# Patient Record
Sex: Male | Born: 1950 | Race: White | Hispanic: No | Marital: Married | Smoking: Former smoker
Health system: Southern US, Community
[De-identification: ages and names within clinical notes are randomized; demographics above are authoritative.]

## PROBLEM LIST (undated history)

## (undated) DIAGNOSIS — Z87442 Personal history of urinary calculi: Secondary | ICD-10-CM

## (undated) DIAGNOSIS — H9319 Tinnitus, unspecified ear: Secondary | ICD-10-CM

## (undated) DIAGNOSIS — Z9119 Patient's noncompliance with other medical treatment and regimen: Secondary | ICD-10-CM

## (undated) DIAGNOSIS — I739 Peripheral vascular disease, unspecified: Secondary | ICD-10-CM

## (undated) DIAGNOSIS — F528 Other sexual dysfunction not due to a substance or known physiological condition: Secondary | ICD-10-CM

## (undated) DIAGNOSIS — I2510 Atherosclerotic heart disease of native coronary artery without angina pectoris: Secondary | ICD-10-CM

## (undated) DIAGNOSIS — J309 Allergic rhinitis, unspecified: Secondary | ICD-10-CM

## (undated) DIAGNOSIS — I10 Essential (primary) hypertension: Secondary | ICD-10-CM

## (undated) DIAGNOSIS — L219 Seborrheic dermatitis, unspecified: Secondary | ICD-10-CM

## (undated) DIAGNOSIS — N320 Bladder-neck obstruction: Secondary | ICD-10-CM

## (undated) DIAGNOSIS — G4730 Sleep apnea, unspecified: Secondary | ICD-10-CM

## (undated) DIAGNOSIS — E119 Type 2 diabetes mellitus without complications: Secondary | ICD-10-CM

## (undated) DIAGNOSIS — R0600 Dyspnea, unspecified: Secondary | ICD-10-CM

## (undated) DIAGNOSIS — Z8719 Personal history of other diseases of the digestive system: Secondary | ICD-10-CM

## (undated) DIAGNOSIS — E785 Hyperlipidemia, unspecified: Secondary | ICD-10-CM

## (undated) HISTORY — DX: Allergic rhinitis, unspecified: J30.9

## (undated) HISTORY — DX: Type 2 diabetes mellitus without complications: E11.9

## (undated) HISTORY — DX: Seborrheic dermatitis, unspecified: L21.9

## (undated) HISTORY — DX: Bladder-neck obstruction: N32.0

## (undated) HISTORY — DX: Tinnitus, unspecified ear: H93.19

## (undated) HISTORY — DX: Patient's noncompliance with other medical treatment and regimen: Z91.19

## (undated) HISTORY — DX: Hyperlipidemia, unspecified: E78.5

## (undated) HISTORY — DX: Peripheral vascular disease, unspecified: I73.9

## (undated) HISTORY — DX: Other sexual dysfunction not due to a substance or known physiological condition: F52.8

## (undated) HISTORY — DX: Essential (primary) hypertension: I10

---

## 1974-12-25 HISTORY — PX: VASECTOMY: SHX75

## 2005-01-24 ENCOUNTER — Ambulatory Visit: Payer: Self-pay | Admitting: Endocrinology

## 2005-02-01 ENCOUNTER — Ambulatory Visit: Payer: Self-pay | Admitting: Endocrinology

## 2005-02-17 ENCOUNTER — Ambulatory Visit: Payer: Self-pay

## 2005-04-28 ENCOUNTER — Ambulatory Visit: Payer: Self-pay | Admitting: Endocrinology

## 2005-05-05 ENCOUNTER — Ambulatory Visit: Payer: Self-pay | Admitting: Endocrinology

## 2005-08-04 ENCOUNTER — Ambulatory Visit: Payer: Self-pay | Admitting: Endocrinology

## 2005-08-11 ENCOUNTER — Ambulatory Visit: Payer: Self-pay | Admitting: Endocrinology

## 2005-08-11 ENCOUNTER — Ambulatory Visit: Payer: Self-pay

## 2005-10-23 ENCOUNTER — Ambulatory Visit: Payer: Self-pay | Admitting: Endocrinology

## 2005-10-25 ENCOUNTER — Ambulatory Visit: Payer: Self-pay | Admitting: Endocrinology

## 2005-11-24 ENCOUNTER — Ambulatory Visit: Payer: Self-pay | Admitting: Endocrinology

## 2006-01-05 ENCOUNTER — Ambulatory Visit: Payer: Self-pay | Admitting: Endocrinology

## 2006-01-19 ENCOUNTER — Ambulatory Visit: Payer: Self-pay

## 2006-02-14 ENCOUNTER — Ambulatory Visit: Payer: Self-pay | Admitting: Endocrinology

## 2006-02-14 ENCOUNTER — Ambulatory Visit: Payer: Self-pay | Admitting: Cardiology

## 2006-03-09 ENCOUNTER — Ambulatory Visit: Payer: Self-pay | Admitting: Endocrinology

## 2006-05-17 ENCOUNTER — Ambulatory Visit: Payer: Self-pay | Admitting: Cardiology

## 2006-06-11 ENCOUNTER — Ambulatory Visit: Payer: Self-pay | Admitting: Endocrinology

## 2006-06-15 ENCOUNTER — Ambulatory Visit: Payer: Self-pay | Admitting: Endocrinology

## 2007-08-10 ENCOUNTER — Encounter: Payer: Self-pay | Admitting: Endocrinology

## 2007-08-10 DIAGNOSIS — I10 Essential (primary) hypertension: Secondary | ICD-10-CM

## 2007-08-10 DIAGNOSIS — E785 Hyperlipidemia, unspecified: Secondary | ICD-10-CM | POA: Insufficient documentation

## 2007-08-10 DIAGNOSIS — J309 Allergic rhinitis, unspecified: Secondary | ICD-10-CM | POA: Insufficient documentation

## 2007-08-10 DIAGNOSIS — E119 Type 2 diabetes mellitus without complications: Secondary | ICD-10-CM

## 2007-08-10 HISTORY — DX: Essential (primary) hypertension: I10

## 2007-08-10 HISTORY — DX: Type 2 diabetes mellitus without complications: E11.9

## 2007-08-10 HISTORY — DX: Allergic rhinitis, unspecified: J30.9

## 2007-08-10 HISTORY — DX: Hyperlipidemia, unspecified: E78.5

## 2007-09-02 ENCOUNTER — Ambulatory Visit: Payer: Self-pay | Admitting: Endocrinology

## 2007-09-02 LAB — CONVERTED CEMR LAB
ALT: 37 units/L (ref 0–53)
Basophils Relative: 0.8 % (ref 0.0–1.0)
Bilirubin Urine: NEGATIVE
Bilirubin, Direct: 0.2 mg/dL (ref 0.0–0.3)
CO2: 31 meq/L (ref 19–32)
Creatinine, Ser: 1.1 mg/dL (ref 0.4–1.5)
Eosinophils Relative: 6.1 % — ABNORMAL HIGH (ref 0.0–5.0)
GFR calc Af Amer: 89 mL/min
Glucose, Bld: 163 mg/dL — ABNORMAL HIGH (ref 70–99)
HCT: 41.5 % (ref 39.0–52.0)
Hemoglobin: 14.5 g/dL (ref 13.0–17.0)
Leukocytes, UA: NEGATIVE
Lymphocytes Relative: 31.9 % (ref 12.0–46.0)
Monocytes Absolute: 0.7 10*3/uL (ref 0.2–0.7)
Neutro Abs: 4.9 10*3/uL (ref 1.4–7.7)
Nitrite: NEGATIVE
Potassium: 4.4 meq/L (ref 3.5–5.1)
Specific Gravity, Urine: 1.02 (ref 1.000–1.03)
TSH: 1.43 microintl units/mL (ref 0.35–5.50)
Total Bilirubin: 1 mg/dL (ref 0.3–1.2)
Total Protein, Urine: NEGATIVE mg/dL
Total Protein: 7.3 g/dL (ref 6.0–8.3)
Triglycerides: 43 mg/dL (ref 0–149)
Urobilinogen, UA: 0.2 (ref 0.0–1.0)
WBC: 9.2 10*3/uL (ref 4.5–10.5)

## 2007-09-06 ENCOUNTER — Ambulatory Visit: Payer: Self-pay | Admitting: Endocrinology

## 2007-09-06 LAB — CONVERTED CEMR LAB: Hgb A1c MFr Bld: 7.1 % — ABNORMAL HIGH (ref 4.6–6.0)

## 2008-01-29 ENCOUNTER — Encounter: Payer: Self-pay | Admitting: Internal Medicine

## 2008-11-09 ENCOUNTER — Telehealth: Payer: Self-pay | Admitting: Internal Medicine

## 2008-11-13 ENCOUNTER — Ambulatory Visit: Payer: Self-pay | Admitting: Endocrinology

## 2008-11-13 DIAGNOSIS — L219 Seborrheic dermatitis, unspecified: Secondary | ICD-10-CM

## 2008-11-13 DIAGNOSIS — I739 Peripheral vascular disease, unspecified: Secondary | ICD-10-CM

## 2008-11-13 DIAGNOSIS — F1010 Alcohol abuse, uncomplicated: Secondary | ICD-10-CM | POA: Insufficient documentation

## 2008-11-13 DIAGNOSIS — F528 Other sexual dysfunction not due to a substance or known physiological condition: Secondary | ICD-10-CM

## 2008-11-13 DIAGNOSIS — N320 Bladder-neck obstruction: Secondary | ICD-10-CM

## 2008-11-13 DIAGNOSIS — Z9119 Patient's noncompliance with other medical treatment and regimen: Secondary | ICD-10-CM

## 2008-11-13 DIAGNOSIS — Z91199 Patient's noncompliance with other medical treatment and regimen due to unspecified reason: Secondary | ICD-10-CM

## 2008-11-13 HISTORY — DX: Bladder-neck obstruction: N32.0

## 2008-11-13 HISTORY — DX: Patient's noncompliance with other medical treatment and regimen due to unspecified reason: Z91.199

## 2008-11-13 HISTORY — DX: Patient's noncompliance with other medical treatment and regimen: Z91.19

## 2008-11-13 HISTORY — DX: Other sexual dysfunction not due to a substance or known physiological condition: F52.8

## 2008-11-13 HISTORY — DX: Seborrheic dermatitis, unspecified: L21.9

## 2008-11-13 HISTORY — DX: Peripheral vascular disease, unspecified: I73.9

## 2009-01-15 ENCOUNTER — Ambulatory Visit: Payer: Self-pay | Admitting: Cardiovascular Disease

## 2009-01-15 ENCOUNTER — Ambulatory Visit: Payer: Self-pay

## 2009-02-22 ENCOUNTER — Encounter: Payer: Self-pay | Admitting: Endocrinology

## 2009-06-16 ENCOUNTER — Telehealth: Payer: Self-pay | Admitting: Endocrinology

## 2009-06-29 ENCOUNTER — Telehealth: Payer: Self-pay | Admitting: Endocrinology

## 2009-06-30 ENCOUNTER — Ambulatory Visit: Payer: Self-pay | Admitting: Endocrinology

## 2009-06-30 LAB — CONVERTED CEMR LAB
ALT: 52 units/L (ref 0–53)
AST: 38 units/L — ABNORMAL HIGH (ref 0–37)
Alkaline Phosphatase: 64 units/L (ref 39–117)
BUN: 32 mg/dL — ABNORMAL HIGH (ref 6–23)
Basophils Relative: 0.5 % (ref 0.0–3.0)
Bilirubin Urine: NEGATIVE
Bilirubin, Direct: 0.2 mg/dL (ref 0.0–0.3)
Chloride: 103 meq/L (ref 96–112)
Creatinine, Ser: 1.5 mg/dL (ref 0.4–1.5)
Eosinophils Relative: 5.8 % — ABNORMAL HIGH (ref 0.0–5.0)
GFR calc non Af Amer: 50.9 mL/min (ref >60)
Hgb A1c MFr Bld: 7.3 % — ABNORMAL HIGH (ref 4.6–6.5)
LDL Cholesterol: 53 mg/dL (ref 0–99)
Leukocytes, UA: NEGATIVE
Lymphocytes Relative: 32.4 % (ref 12.0–46.0)
MCV: 89.6 fL (ref 78.0–100.0)
Microalb Creat Ratio: 8.3 mg/g (ref 0.0–30.0)
Monocytes Absolute: 0.8 10*3/uL (ref 0.1–1.0)
Monocytes Relative: 8.3 % (ref 3.0–12.0)
Neutrophils Relative %: 53 % (ref 43.0–77.0)
Nitrite: NEGATIVE
PSA: 1.9 ng/mL (ref 0.10–4.00)
Platelets: 230 10*3/uL (ref 150.0–400.0)
RBC: 5.19 M/uL (ref 4.22–5.81)
Total Bilirubin: 1.3 mg/dL — ABNORMAL HIGH (ref 0.3–1.2)
Total CHOL/HDL Ratio: 2
Total Protein, Urine: NEGATIVE mg/dL
Total Protein: 7.7 g/dL (ref 6.0–8.3)
Triglycerides: 38 mg/dL (ref 0.0–149.0)
VLDL: 7.6 mg/dL (ref 0.0–40.0)
WBC: 9.5 10*3/uL (ref 4.5–10.5)
pH: 5.5 (ref 5.0–8.0)

## 2009-07-02 ENCOUNTER — Ambulatory Visit: Payer: Self-pay | Admitting: Endocrinology

## 2009-07-23 ENCOUNTER — Telehealth: Payer: Self-pay | Admitting: Endocrinology

## 2009-11-02 ENCOUNTER — Ambulatory Visit: Payer: Self-pay | Admitting: Endocrinology

## 2009-11-02 ENCOUNTER — Telehealth (INDEPENDENT_AMBULATORY_CARE_PROVIDER_SITE_OTHER): Payer: Self-pay | Admitting: *Deleted

## 2009-11-02 LAB — CONVERTED CEMR LAB
ALT: 39 units/L (ref 0–53)
Bilirubin, Direct: 0.2 mg/dL (ref 0.0–0.3)
Cholesterol: 104 mg/dL (ref 0–200)
HDL: 34 mg/dL — ABNORMAL LOW (ref >39.00)
LDL Cholesterol: 63 mg/dL (ref 0–99)
Total Bilirubin: 1 mg/dL (ref 0.3–1.2)
Total Protein: 7.7 g/dL (ref 6.0–8.3)
Triglycerides: 33 mg/dL (ref 0.0–149.0)

## 2009-11-05 ENCOUNTER — Ambulatory Visit: Payer: Self-pay | Admitting: Endocrinology

## 2009-11-05 DIAGNOSIS — H9319 Tinnitus, unspecified ear: Secondary | ICD-10-CM | POA: Insufficient documentation

## 2009-11-05 HISTORY — DX: Tinnitus, unspecified ear: H93.19

## 2009-12-07 ENCOUNTER — Telehealth: Payer: Self-pay | Admitting: Endocrinology

## 2010-09-09 ENCOUNTER — Encounter: Payer: Self-pay | Admitting: Endocrinology

## 2010-10-14 ENCOUNTER — Ambulatory Visit: Payer: Self-pay | Admitting: Endocrinology

## 2010-10-14 LAB — CONVERTED CEMR LAB
Basophils Absolute: 0 10*3/uL (ref 0.0–0.1)
Bilirubin, Direct: 0.2 mg/dL (ref 0.0–0.3)
Calcium: 9.8 mg/dL (ref 8.4–10.5)
Eosinophils Absolute: 0.5 10*3/uL (ref 0.0–0.7)
GFR calc non Af Amer: 90.22 mL/min (ref >60)
HDL: 38.1 mg/dL — ABNORMAL LOW (ref >39.00)
Leukocytes, UA: NEGATIVE
MCHC: 34 g/dL (ref 30.0–36.0)
MCV: 87.9 fL (ref 78.0–100.0)
Monocytes Absolute: 0.8 10*3/uL (ref 0.1–1.0)
Neutrophils Relative %: 52.6 % (ref 43.0–77.0)
Nitrite: NEGATIVE
PSA: 1.32 ng/mL (ref 0.10–4.00)
Platelets: 233 10*3/uL (ref 150.0–400.0)
Potassium: 4.9 meq/L (ref 3.5–5.1)
RDW: 14.2 % (ref 11.5–14.6)
Sodium: 139 meq/L (ref 135–145)
TSH: 1.42 microintl units/mL (ref 0.35–5.50)
Total Bilirubin: 0.9 mg/dL (ref 0.3–1.2)
Total CHOL/HDL Ratio: 4
Total Protein, Urine: NEGATIVE mg/dL
VLDL: 7 mg/dL (ref 0.0–40.0)
WBC: 8.7 10*3/uL (ref 4.5–10.5)
pH: 6 (ref 5.0–8.0)

## 2010-10-21 ENCOUNTER — Ambulatory Visit: Payer: Self-pay | Admitting: Endocrinology

## 2010-10-21 ENCOUNTER — Encounter: Payer: Self-pay | Admitting: Endocrinology

## 2010-10-21 DIAGNOSIS — R9431 Abnormal electrocardiogram [ECG] [EKG]: Secondary | ICD-10-CM

## 2010-10-21 LAB — CONVERTED CEMR LAB
Creatinine,U: 194.7 mg/dL
Microalb, Ur: 1.5 mg/dL (ref 0.0–1.9)

## 2010-10-24 ENCOUNTER — Encounter (HOSPITAL_COMMUNITY)
Admission: RE | Admit: 2010-10-24 | Discharge: 2010-12-24 | Payer: Self-pay | Source: Home / Self Care | Attending: Endocrinology | Admitting: Endocrinology

## 2010-10-24 ENCOUNTER — Encounter: Payer: Self-pay | Admitting: Internal Medicine

## 2010-10-24 ENCOUNTER — Ambulatory Visit: Payer: Self-pay | Admitting: Internal Medicine

## 2010-10-24 ENCOUNTER — Ambulatory Visit: Payer: Self-pay

## 2010-10-25 ENCOUNTER — Telehealth: Payer: Self-pay | Admitting: Endocrinology

## 2011-01-26 ENCOUNTER — Telehealth: Payer: Self-pay | Admitting: Endocrinology

## 2011-01-26 NOTE — Assessment & Plan Note (Signed)
Summary: CPX-LB   Vital Signs:  Patient profile:   60 year old male Height:      67 inches (170.18 cm) Weight:      214.25 pounds (97.39 kg) BMI:     33.68 O2 Sat:      95 % on Room air Temp:     98.4 degrees F (36.89 degrees C) oral Pulse rate:   93 / minute BP sitting:   120 / 78  (left arm) Cuff size:   large  Vitals Entered By: Brenton Grills CMA Duncan Dull) (October 21, 2010 2:14 PM)  O2 Flow:  Room air CC: Physical/pt is not currently taking Questran or Relion Insulin/aj Is Patient Diabetic? Yes   CC:  Physical/pt is not currently taking Questran or Relion Insulin/aj.  History of Present Illness: here for regular wellness examination.  He's feeling pretty well in general, and does not drink or smoke.  Current Medications (verified): 1)  Toprol Xl 25 Mg  Tb24 (Metoprolol Succinate) .... Take 1 By Mouth Qd 2)  Novolog Flexpen 100 Unit/ml  Soln (Insulin Aspart) .... 30 Units 5 Times A Day 3)  Adult Aspirin Low Strength 81 Mg  Tbdp (Aspirin) .... Take 1 By Mouth Qd 4)  Questran 4 Gm  Pack (Cholestyramine) .... Take 1 Once Daily Pack 5)  Viagra 50 Mg  Tabs (Sildenafil Citrate) .... Use Prn 6)  Allegra-D 24 Hour 180-240 Mg  Tb24 (Fexofenadine-Pseudoephedrine) .... Use Prn 7)  Relion Blood Glucose Test   Strp (Glucose Blood) .... Use As Directed 8)  Hyzaar 100-25 Mg Tabs (Losartan Potassium-Hctz) .... 1/2 Qd 9)  Simvastatin 80 Mg Tabs (Simvastatin) .Marland Kitchen.. 1 Qhs 10)  Flomax 0.4 Mg Xr24h-Cap (Tamsulosin Hcl) .Marland Kitchen.. 1 Qd 11)  Relion N 100 Unit/ml Susp (Insulin Isophane Human) .... 5 Units Qhs  Allergies (verified): 1)  ! Sulfa 2)  ! Glucophage 3)  ! * Crestor  Family History: Reviewed history from 11/05/2009 and no changes required. no cancer in immediate family father died heart disease at age 46  Social History: Reviewed history from 07/02/2009 and no changes required. married. works for [employer].  Review of Systems  The patient denies fever, weight loss,  weight gain, vision loss, decreased hearing, syncope, dyspnea on exertion, prolonged cough, headaches, abdominal pain, melena, hematochezia, severe indigestion/heartburn, hematuria, suspicious skin lesions, and depression.    Physical Exam  Rectal:  normal external and internal exam.  heme neg  Prostate:  Normal size prostate without masses or tenderness.  Additional Exam:  SEPARATE EVALUATION FOLLOWS--EACH PROBLEM HERE IS NEW, NOT RESPONDING TO TREATMENT, OR POSES SIGNIFICANT RISK TO THE PATIENT'S HEALTH: HISTORY OF THE PRESENT ILLNESS: abnormal ecg is noted today.  he has few years of slight pain in the legs, in the context of walking, worse on the right leg, than the left.  no assoc chest pain. PAST MEDICAL HISTORY reviewed and up to date today. SOCIAL HX:  10/31 is the last day of his health insurance. REVIEW OF SYSTEMS: denies hypoglycemia.  he has lost weight, due to his efforts PHYSICAL EXAMINATION: clear to auscultation.  no respiratory distress dorsalis pedis are absent bilat.  no carotid bruit cv: reg rate and rhythm.  no murmur LAB/XRAY RESULTS: ecg is reviewed IMPRESSION: abnormal ecg (new) in a pt at high-risk for coronary artery disease.  i discussed the case with dr cooper. PLAN: see instruction sheet   Impression & Recommendations:  Problem # 1:  ROUTINE GENERAL MEDICAL EXAM@HEALTH  CARE FACL (ICD-V70.0)  Other Orders: EKG w/ Interpretation (93000) Cardiolite (Cardiolite) TLB-A1C / Hgb A1C (Glycohemoglobin) (83036-A1C) TLB-Microalbumin/Creat Ratio, Urine (82043-MALB) Est. Patient Level IV (35009) Est. Patient 40-64 years (38182)   Patient Instructions: 1)  you should resume cholestyramine.  here are some samples of "welchol" (similar) 2)  here are some samples of diovan-hct 320/25 (similar to hyzaar). 3)  Please schedule a follow-up appointment in 6 months. 4)  blood tests are being ordered for you today.  please call [phone] to hear your test results. 5)   good diet and exercise habits significanly improve the control of your diabetes.  please let me know if you wish to be referred to a dietician.  high blood sugar is very risky to your health.  you should see an eye doctor every year. 6)  controlling your blood pressure and cholesterol drastically reduces the damage diabetes does to your body.  this also applies to quitting smoking.  please discuss these with your doctor.  you should take an aspirin every day, unless you have been advised by a doctor not to. 7)  please consider these measures for your health:  minimize alcohol.  do not use tobacco products.  have a colonoscopy at least every 10 years from age 37.  flu shots and pneumonis vaccine help to prevent illnesses.  keep firearms safely stored.  always use seat belts.  have working smoke alarms in your home.  see an eye doctor and dentist regularly.  never drive under the influence of alcohol or drugs (including prescription drugs).  those with fair skin should take precautions against the sun. Prescriptions: FLOMAX 0.4 MG XR24H-CAP (TAMSULOSIN HCL) 1 qd  #90 x 3   Entered and Authorized by:   Minus Breeding MD   Signed by:   Minus Breeding MD on 10/21/2010   Method used:   Print then Give to Patient   RxID:   [id number] SIMVASTATIN 80 MG TABS (SIMVASTATIN) 1 qhs  #90 x 3   Entered and Authorized by:   Minus Breeding MD   Signed by:   Minus Breeding MD on 10/21/2010   Method used:   Print then Give to Patient   RxID:   [id number] RELION BLOOD GLUCOSE TEST   STRP (GLUCOSE BLOOD) use as directed  #100 Each x 0   Entered and Authorized by:   Minus Breeding MD   Signed by:   Minus Breeding MD on 10/21/2010   Method used:   Print then Give to Patient   RxID:   [id number] QUESTRAN 4 GM  PACK (CHOLESTYRAMINE) take 1 once daily pack  #90 x 3   Entered and Authorized by:   Minus Breeding MD   Signed by:   Minus Breeding MD on 10/21/2010   Method used:   Print then Give to  Patient   RxID:   [id number] NOVOLOG FLEXPEN 100 UNIT/ML  SOLN (INSULIN ASPART) 30 units 5 times a day  #3 boxes x 11   Entered and Authorized by:   Minus Breeding MD   Signed by:   Minus Breeding MD on 10/21/2010   Method used:   Print then Give to Patient   RxID:   [id number] TOPROL XL 25 MG  TB24 (METOPROLOL SUCCINATE) take 1 by mouth qd  #90 x 3   Entered and Authorized by:   Minus Breeding MD   Signed by:   Minus Breeding MD on 10/21/2010   Method used:  Print then Give to Patient   RxID:   [id number]    Orders Added: 1)  EKG w/ Interpretation [93000] 2)  Cardiolite [Cardiolite] 3)  TLB-A1C / Hgb A1C (Glycohemoglobin) [83036-A1C] 4)  TLB-Microalbumin/Creat Ratio, Urine [82043-MALB] 5)  Est. Patient Level IV [56213] 6)  Est. Patient 40-64 years [08657]

## 2011-01-26 NOTE — Letter (Signed)
Summary: Dr Lita Mains. Sherryle Lis  Dr Lita Mains. [place]   Imported By: Sherian Rein 09/15/2010 11:03:56  _____________________________________________________________________  External Attachment:    Type:   Image     Comment:   External Document

## 2011-01-26 NOTE — Assessment & Plan Note (Signed)
Summary: Cardiology Nuclear Testing  Nuclear Med Background Indications for Stress Test: Evaluation for Ischemia, Abnormal EKG  Indications Comments: Abnormal EKG   History: Myocardial Perfusion Study  History Comments: '06 MPS normal  Symptoms: SOB    Nuclear Pre-Procedure Cardiac Risk Factors: Claudication, Family History - CAD, History of Smoking, Hypertension, IDDM Type 2, Lipids, PVD Caffeine/Decaff Intake: none NPO After: 11:00 PM Lungs: clear IV 0.9% NS with Angio Cath: 22g     IV Site: R Hand IV Started by: Cathlyn Parsons, RN Chest Size (in) 48     Height (in): 67 Weight (lb): 213 BMI: 33.48 Tech Comments: Toprol held for 10hrs.   Nuclear Med Study 1 or 2 day study:  1 day     Stress Test Type:  Treadmill/Lexiscan Reading MD:  Dietrich Pates, MD     Referring MD:  S.Ellison Resting Radionuclide:  Technetium 29m Tetrofosmin     Resting Radionuclide Dose:  11 mCi  Stress Radionuclide:  Technetium 47m Tetrofosmin     Stress Radionuclide Dose:  33 mCi   Stress Protocol  Max Systolic BP: 106 mm Hg Lexiscan: 0.4 mg   Stress Test Technologist:  Milana Na, EMT-P     Nuclear Technologist:  Doyne Keel, CNMT  Rest Procedure  Myocardial perfusion imaging was performed at rest 45 minutes following the intravenous administration of Technetium 73m Tetrofosmin.  Stress Procedure  The patient received IV Lexiscan 0.4 mg over 15-seconds with concurrent low level exercise and then Technetium 46m Tetrofosmin was injected at 30-seconds while the patient continued walking one more minute.  There were non specific changes with Lexiscan.  Quantitative spect images were obtained after a 45 minute delay.  QPS Raw Data Images:  Images were motion corrected.  Soft tissue (diaphragm, bowel activity) underlies heart. Stress Images:  Minimal apical thinning  Otherwise normal perfusion.  Rest Images:  Improvement in the apical region. Subtraction (SDS):  Change is not signif for  ischemia. Transient Ischemic Dilatation:  1.02  (Normal <1.22)  Lung/Heart Ratio:  .33  (Normal <0.45)  Quantitative Gated Spect Images QGS EDV:  98 ml QGS ESV:  38 ml QGS EF:  61 %   Overall Impression  Exercise Capacity: Lexiscan with low level exercise BP Response: Normal blood pressure response. Clinical Symptoms: No chest pain ECG Impression: No significant ST segment change suggestive of ischemia. Overall Impression Comments: Stress images with minimal apical thinning otherwise normal perfusion.  Apex is normal in the rest images.  Overall, though  change in this small region does not appear to reflect significant ischemia.  Overall low risk scan.

## 2011-01-26 NOTE — Progress Notes (Signed)
  Phone Note Call from Patient Call back at Home Phone [phone]   Caller: Patient Summary of Call: Pt called requesting Rx for Insulin vial instead of pen due to cost. Pt is also requesting MD advise on instructions for Diovan sample given at last OV, should pt take 1/2 tab as he was with Hyzaar or a whole tablet? Initial call taken by: Margaret Pyle, CMA,  October 25, 2010 10:25 AM  Follow-up for Phone Call        good news:  you heart test was normal the diovan-hct is interchangeable with hyzaar, so take 1/2 tab once daily. i am changed novolog to vial.  do you want a printed rx, or electronically sent?  you should be good with the samples i gave you for now, aren't you? Follow-up by: Minus Breeding MD,  October 25, 2010 10:46 AM  Additional Follow-up for Phone Call Additional follow up Details #1::        Pt does have enough samples, he is requesting printed Rx  Additional Follow-up by: Margaret Pyle, CMA,  October 25, 2010 11:18 AM    Additional Follow-up for Phone Call Additional follow up Details #2::    i printed Follow-up by: Minus Breeding MD,  October 25, 2010 11:20 AM  Additional Follow-up for Phone Call Additional follow up Details #3:: Details for Additional Follow-up Action Taken: Pt informed, Rx in cabinet for pt pick up Additional Follow-up by: Margaret Pyle, CMA,  October 25, 2010 11:27 AM  New/Updated Medications: NOVOLOG 100 UNIT/ML SOLN (INSULIN ASPART) 30 units 5x a day Prescriptions: NOVOLOG 100 UNIT/ML SOLN (INSULIN ASPART) 30 units 5x a day  #5 vials x 11   Entered and Authorized by:   Minus Breeding MD   Signed by:   Minus Breeding MD on 10/25/2010   Method used:   Print then Give to Patient   RxID:   [id number]

## 2011-02-01 NOTE — Progress Notes (Signed)
Summary: Rx increase  Phone Note Call from Patient Call back at Home Phone [phone]   Caller: Patient Summary of Call: Pt called requesting increase in Rx for Flomax, pt states he discussed this with MD at last OV and has noticed that medication has become less effective over time. Initial call taken by: Margaret Pyle, CMA,  January 26, 2011 9:10 AM  Follow-up for Phone Call        rx sent i am happy to ref you to urol if you wish Follow-up by: Minus Breeding MD,  January 26, 2011 9:20 AM    New/Updated Medications: TAMSULOSIN HCL 0.4 MG CAPS (TAMSULOSIN HCL) 1 tab two times a day Prescriptions: TAMSULOSIN HCL 0.4 MG CAPS (TAMSULOSIN HCL) 1 tab two times a day  #60 x 11   Entered and Authorized by:   Minus Breeding MD   Signed by:   Minus Breeding MD on 01/26/2011   Method used:   Electronically to        [facility] (retail)       [address]       [place], [place]  [address]       Ph: [phone]       Fax: [phone]   RxID:   [id number]

## 2011-02-01 NOTE — Progress Notes (Signed)
Summary: tamsulosin rx?  Phone Note From Pharmacy   Caller: [facility] Summary of Call: [facility] sent fax asking to verify dosage of Tamsulosin. They want to verify if it should be 1 two times a day vs. 2 once daily  Initial call taken by: Brenton Grills CMA Duncan Dull),  January 26, 2011 10:07 AM  Follow-up for Phone Call        either is ok Follow-up by: Minus Breeding MD,  January 26, 2011 10:29 AM  Additional Follow-up for Phone Call Additional follow up Details #1::        pharmacy advised 0.8 mg 1 by mouth once daily 30x11 Additional Follow-up by: Margaret Pyle, CMA,  January 26, 2011 10:45 AM

## 2011-05-09 NOTE — Progress Notes (Signed)
[facility]                        PERIPHERAL VASCULAR OFFICE NOTE   NAME:OCONNORKashis, Patterson                      MRN:          [id number]  DATE:01/15/2009                            DOB:          Apr 24, 1951    REASON FOR VISIT:  Lower extremity PAD.   HISTORY OF PRESENT ILLNESS:  Mr. Antonio Patterson is a very nice 60 year old  gentleman with occlusion of his right popliteal artery and intermittent  right calf claudication.  He was initially diagnosed by ultrasound that  demonstrated widely patent proximal vessels of the leg with complete  occlusion of the right popliteal artery.  His ABI back in 2007 was 0.77  on the right and 1.2 on the left.  He admits to intermittent  claudication after approximately one-quarter mile of walking.  He is  able to walk up to a mile without stopping, but has some has right calf  cramping and aching, more pronounced if he walks at a faster pace.  His  right leg does not feel cool.  He has no thigh pain or buttock pain.  He  denies ulcerations or rest pain.  He denies chest pain, dyspnea, edema,  or other cardiac complaints.   MEDICATIONS:  1. NovoLog as directed.  2. Aspirin 81 mg daily.  3. Vytorin 10/80 mg daily.  4. Benicar HCT 20/12.5 mg daily.  5. Metoprolol 12.5 mg b.i.d.  6. Flomax 0.4 mg daily.  7. Multivitamin 1 daily.  8. Claritin-D daily.   ALLERGIES:  SULFA, GLUCOPHAGE, CRESTOR.   PHYSICAL EXAMINATION:  GENERAL:  The patient is alert and oriented.  He  is in no acute distress.  VITAL SIGNS:  Weight is 216 pounds, blood pressure 116/70 in the right  arm and 110/70 in the left arm, heart rate 68, respiratory rate is 12.  HEENT:  Normal.  NECK:  Normal carotid upstrokes.  No bruits.  JVP normal.  LUNGS:  Clear bilaterally.  HEART:  Regular rate and rhythm.  No murmurs or gallops.  ABDOMEN:  Soft, nontender.  No organomegaly.  No abdominal bruits.  BACK:  No CVA tenderness.  EXTREMITIES:  Femoral pulses are  2+, popliteal pulses are 2+, dorsalis  pedis and posterior tibial pulses are 2+ on the left and not palpable on  the right.  SKIN:  Warm and dry without rash.  There are no ischemic ulcerations.  NEUROLOGIC:  Cranial nerves II through XII are intact.  Strength is  intact and equal.   ASSESSMENT:  1. 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      Recommend continued medical therapy.  The patient is on an      excellent medical regimen with aggressive treatment of his      hypertension, dyslipidemia, and diabetes.  He is on antiplatelet      therapy with aspirin.  His symptoms are stable and only minimally      lifestyle limiting.  We will check ABIs as it has been 3 years      since his last study  to have objective data on his lower extremity      arterial disease.  I would like to see him back in 1 year for      followup.  2. Hypertension.  Blood pressure under optimal control on Benicar HCT      and metoprolol.  3. Dyslipidemia.  He remains on Vytorin 10/80 and is followed by Dr.      Everardo All.   For followup, I will see Mr. Antonio Patterson in 1 year.     Veverly Fells. Excell Seltzer, MD  Electronically Signed    MDC/MedQ  DD: 01/15/2009  DT: 01/16/2009  Job #: [id number]

## 2011-05-31 ENCOUNTER — Encounter: Payer: Self-pay | Admitting: Endocrinology

## 2011-05-31 ENCOUNTER — Ambulatory Visit: Payer: Self-pay | Admitting: Endocrinology

## 2011-05-31 ENCOUNTER — Ambulatory Visit (INDEPENDENT_AMBULATORY_CARE_PROVIDER_SITE_OTHER): Payer: Self-pay | Admitting: Endocrinology

## 2011-05-31 VITALS — BP 122/68 | HR 78 | Temp 98.2°F | Ht 67.0 in | Wt 192.0 lb

## 2011-05-31 DIAGNOSIS — E119 Type 2 diabetes mellitus without complications: Secondary | ICD-10-CM

## 2011-05-31 MED ORDER — SILDENAFIL CITRATE 100 MG PO TABS: 100.0000 mg | ORAL_TABLET | ORAL | Status: DC | PRN

## 2011-05-31 MED ORDER — METFORMIN HCL ER 500 MG PO TB24: 500.0000 mg | ORAL_TABLET | Freq: Every day | ORAL | Status: DC

## 2011-05-31 NOTE — Patient Instructions (Addendum)
Here are some samples of "rapaflo," 8 mg to take daily, instead of the flomax. Stop the metoprolol. Reduce the simvastatin to 40 mg (1/2 of 80 mg) daily. Start metformin 2x500 mg each morning.  You may wish to start at just 1 pill per day, to see if you get diarrhea again.   Take just enough humalog (or apidra, which i have also given you samples of) to keep your blood sugar in the low-100's. Please make a follow-up appointment in 3 months. You should call [phone], to a pharmacy in [place], which sells viagra at $65 for 20 pills.  Here is a prescription.

## 2011-05-31 NOTE — Progress Notes (Signed)
  Subjective:    Patient ID: Antonio Patterson, male    DOB: April 08, 1951, 60 y.o.   MRN: [id number]  HPI The state of at least three ongoing medical problems is addressed today: Dm: he has taken no insulin x a few weeks, due to his lack of health insurance.  no cbg record, but states cbg's are in the mid to high-100's. Bph: pt says he gets incomplete relief from the flomax.   Dyslipidemia: he wants to know if he can reduce rx, due to his weight loss.  He declines labs today, due to cost. Past Medical History  Diagnosis Date  . DIABETES MELLITUS, TYPE II 08/10/2007  . HYPERLIPIDEMIA 08/10/2007  . ERECTILE DYSFUNCTION 11/13/2008  . 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 HYPERTENSION 08/10/2007  . UNSPECIFIED PERIPHERAL VASCULAR DISEASE 11/13/2008  . ALLERGIC RHINITIS 08/10/2007  . BLADDER OUTLET OBSTRUCTION 11/13/2008  . PERS HX NONCOMPLIANCE W/MED TX PRS HAZARDS HLTH 11/13/2008  . Seborrhea 11/13/2008    Past Surgical History  Procedure Date  . Vasectomy 1976    History   Social History  . Marital Status: Married    Spouse Name: N/A    Number of Children: N/A  . Years of Education: N/A   Occupational History  .      Works for [employer]   Social History Main Topics  . Smoking status: Former Smoker    Quit date: 12/25/1985  . Smokeless tobacco: Not on file  . Alcohol Use: Yes     6/week  . Drug Use: Not on file  . Sexually Active: Not on file   Other Topics Concern  . Not on file   Social History Narrative  . No narrative on file    No current outpatient prescriptions on file prior to visit.    Allergies  Allergen Reactions  . Metformin   . Rosuvastatin   . Sulfonamide Derivatives     Family History  Problem Relation Age of Onset  . Cancer Neg Hx     No cancer in immediate family  . Heart disease Father     BP 122/68  Pulse 78  Temp(Src) 98.2 F (36.8 C) (Oral)  Ht 5\' 7"  (1.702 m)  Wt 192 lb (87.091 kg)  BMI 30.07 kg/m2  SpO2  95% Review of Systems He has lost 22 lbs x 7 mos.  Denies n/v.   Objective:   Physical Exam LUNGS:  Clear to auscultation HEART: Regular rate and rhythm without murmurs noted. Normal S1,S2.   Pulses: dorsalis pedis intact bilat.   Feet: no deformity.  no ulcer on the feet.  feet are of normal color and temp.  no edema Neuro: sensation is intact to touch on the feet. Assessment & Plan:  Dm, therapy limited by noncompliance.  i'll do the best i can. Weight loss, prob due to hyperglycemia. Lack of insurance limits his health-care Bph, needs increased rx rx of dyslipidemia is limited by his inability to pay for labs, but he may be able to reduce rx, due to weight loss. Htn.  Now slightly overcontrolled due to weight loss.

## 2011-06-30 ENCOUNTER — Telehealth: Payer: Self-pay | Admitting: *Deleted

## 2011-06-30 MED ORDER — METFORMIN HCL ER 500 MG PO TB24: 500.0000 mg | ORAL_TABLET | Freq: Two times a day (BID) | ORAL | Status: DC

## 2011-06-30 NOTE — Telephone Encounter (Signed)
i refilled 

## 2011-06-30 NOTE — Telephone Encounter (Signed)
R'cd fax from [facility] pharmacy for new rx for Metformin. Pt states that he is suppose to take Metformin 500 bid. If correct, pharmacy needs new rx to be sent.

## 2011-10-04 ENCOUNTER — Other Ambulatory Visit: Payer: Self-pay

## 2011-10-04 MED ORDER — LOSARTAN POTASSIUM-HCTZ 100-25 MG PO TABS: 0.5000 | ORAL_TABLET | Freq: Every day | ORAL | Status: DC

## 2011-12-04 ENCOUNTER — Other Ambulatory Visit: Payer: Self-pay | Admitting: *Deleted

## 2011-12-04 MED ORDER — SIMVASTATIN 40 MG PO TABS: 40.0000 mg | ORAL_TABLET | Freq: Every evening | ORAL | Status: DC

## 2011-12-04 NOTE — Telephone Encounter (Signed)
R'cd fax from [facility] for refill of Simvastatin  Last OV-05/31/2011  Last filled-08/26/2011

## 2012-01-01 ENCOUNTER — Other Ambulatory Visit: Payer: Self-pay | Admitting: Endocrinology

## 2012-04-05 ENCOUNTER — Other Ambulatory Visit: Payer: Self-pay | Admitting: Endocrinology

## 2012-07-05 ENCOUNTER — Other Ambulatory Visit (INDEPENDENT_AMBULATORY_CARE_PROVIDER_SITE_OTHER): Payer: Self-pay

## 2012-07-05 ENCOUNTER — Ambulatory Visit (INDEPENDENT_AMBULATORY_CARE_PROVIDER_SITE_OTHER): Payer: Self-pay | Admitting: Endocrinology

## 2012-07-05 ENCOUNTER — Encounter: Payer: Self-pay | Admitting: Endocrinology

## 2012-07-05 VITALS — BP 132/92 | HR 72 | Temp 97.9°F | Ht 67.0 in | Wt 199.0 lb

## 2012-07-05 DIAGNOSIS — E119 Type 2 diabetes mellitus without complications: Secondary | ICD-10-CM

## 2012-07-05 DIAGNOSIS — I10 Essential (primary) hypertension: Secondary | ICD-10-CM

## 2012-07-05 DIAGNOSIS — E785 Hyperlipidemia, unspecified: Secondary | ICD-10-CM

## 2012-07-05 LAB — BASIC METABOLIC PANEL
CO2: 29 mEq/L (ref 19–32)
Chloride: 102 mEq/L (ref 96–112)
Glucose, Bld: 144 mg/dL — ABNORMAL HIGH (ref 70–99)
Sodium: 139 mEq/L (ref 135–145)

## 2012-07-05 LAB — URINALYSIS, ROUTINE W REFLEX MICROSCOPIC
Bilirubin Urine: NEGATIVE
Ketones, ur: NEGATIVE
Urobilinogen, UA: 0.2 (ref 0.0–1.0)

## 2012-07-05 LAB — HEMOGLOBIN A1C: Hgb A1c MFr Bld: 6.8 % — ABNORMAL HIGH (ref 4.6–6.5)

## 2012-07-05 LAB — LIPID PANEL
HDL: 44.3 mg/dL (ref >39.00)
LDL Cholesterol: 63 mg/dL (ref 0–99)
Total CHOL/HDL Ratio: 3
Triglycerides: 29 mg/dL (ref 0.0–149.0)

## 2012-07-05 MED ORDER — METFORMIN HCL ER 500 MG PO TB24: 500.0000 mg | ORAL_TABLET | Freq: Two times a day (BID) | ORAL | Status: DC

## 2012-07-05 MED ORDER — INSULIN REGULAR HUMAN 100 UNIT/ML IJ SOLN: 30.0000 [IU] | Freq: Four times a day (QID) | INTRAMUSCULAR | Status: DC

## 2012-07-05 MED ORDER — FLUTICASONE PROPIONATE 50 MCG/ACT NA SUSP: 2.0000 | Freq: Every day | NASAL | Status: DC

## 2012-07-05 MED ORDER — LOSARTAN POTASSIUM-HCTZ 100-25 MG PO TABS: ORAL_TABLET | ORAL | Status: DC

## 2012-07-05 MED ORDER — SIMVASTATIN 40 MG PO TABS: 40.0000 mg | ORAL_TABLET | Freq: Every evening | ORAL | Status: DC

## 2012-07-05 MED ORDER — TAMSULOSIN HCL 0.4 MG PO CAPS: ORAL_CAPSULE | ORAL | Status: DC

## 2012-07-05 MED ORDER — SILDENAFIL CITRATE 100 MG PO TABS: 100.0000 mg | ORAL_TABLET | Freq: Every day | ORAL | Status: DC | PRN

## 2012-07-05 NOTE — Patient Instructions (Addendum)
good diet and exercise habits significanly improve the control of your diabetes.  please let me know if you wish to be referred to a dietician.  high blood sugar is very risky to your health.  you should see an eye doctor every year. controlling your blood pressure and cholesterol drastically reduces the damage diabetes does to your body.  this also applies to quitting smoking.  please discuss these with your doctor.  you should take an aspirin every day, unless you have been advised by a doctor not to. check your blood sugar once a day.  vary the time of day when you check, between before the 3 meals, and at bedtime.  also check if you have symptoms of your blood sugar being too high or too low.  please keep a record of the readings and bring it to your next appointment here.  please call us sooner if your blood sugar goes below 70, or if it stays over 200. I realize you do not have health insurance, but there are many other services you need for your health.  These include screening tests for colon and prostate cancer; electrocardiogram, and others.  Please let me know if you want to get these important tests done.   Here is a prescription for a generic nose spray.   Change novolog to regular insulin (cheaper).

## 2012-07-05 NOTE — Progress Notes (Signed)
Subjective:    Patient ID: Antonio Patterson, male    DOB: [birth year], 61 y.o.   MRN: [id number]  HPI The state of at least three ongoing medical problems is addressed today: Dm: he takes insulin only intermittently, due to his lack of health insurance.  no cbg record, but states cbg's are "high."  denies hypoglycemia Dyslipidemia: Denies weight change.  Allergic rhinitis: is worse recently Past Medical History  Diagnosis Date  . DIABETES MELLITUS, TYPE II 08/10/2007  . HYPERLIPIDEMIA 08/10/2007  . ERECTILE DYSFUNCTION 11/13/2008  . 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 HYPERTENSION 08/10/2007  . UNSPECIFIED PERIPHERAL VASCULAR DISEASE 11/13/2008  . ALLERGIC RHINITIS 08/10/2007  . BLADDER OUTLET OBSTRUCTION 11/13/2008  . PERS HX NONCOMPLIANCE W/MED TX PRS HAZARDS HLTH 11/13/2008  . Seborrhea 11/13/2008    Past Surgical History  Procedure Date  . Vasectomy 1976    History   Social History  . Marital Status: Married    Spouse Name: N/A    Number of Children: N/A  . Years of Education: N/A   Occupational History  .      Works for [employer]   Social History Main Topics  . Smoking status: Former Smoker    Quit date: 12/25/1985  . Smokeless tobacco: Not on file  . Alcohol Use: Yes     6/week  . Drug Use: Not on file  . Sexually Active: Not on file   Other Topics Concern  . Not on file   Social History Narrative  . No narrative on file    Current Outpatient Prescriptions on File Prior to Visit  Medication Sig Dispense Refill  . aspirin 81 MG tablet Take 81 mg by mouth daily.        Marland Kitchen glucose blood (RELION GLUCOSE TEST STRIPS) test strip Use as instructed       . DISCONTD: losartan-hydrochlorothiazide (HYZAAR) 100-25 MG per tablet TAKE 1/2 TABLET BY MOUTH ONCE A DAY  45 tablet  1  . DISCONTD: simvastatin (ZOCOR) 40 MG tablet Take 1 tablet (40 mg total) by mouth every evening.  90 tablet  1  . fluticasone (FLONASE) 50 MCG/ACT nasal spray Place 2  sprays into the nose daily.  16 g  11  . insulin regular (NOVOLIN R,HUMULIN R) 100 units/mL injection Inject 0.3 mLs (30 Units total) into the skin 4 (four) times daily.  40 mL  12  . DISCONTD: metFORMIN (GLUCOPHAGE-XR) 500 MG 24 hr tablet Take 1 tablet (500 mg total) by mouth 2 (two) times daily.  60 tablet  11  . DISCONTD: sildenafil (VIAGRA) 100 MG tablet Take 1 tablet (100 mg total) by mouth as needed for erectile dysfunction.  20 tablet  3    Allergies  Allergen Reactions  . Metformin   . Rosuvastatin   . Sulfonamide Derivatives     Family History  Problem Relation Age of Onset  . Cancer Neg Hx     No cancer in immediate family  . Heart disease Father     BP 132/92  Pulse 72  Temp 97.9 F (36.6 C) (Oral)  Ht 5\' 7"  (1.702 m)  Wt 199 lb (90.266 kg)  BMI 31.17 kg/m2  SpO2 96%  Review of Systems Denies chest pain    Objective:   Physical Exam VITAL SIGNS:  See vs page GENERAL: no distress NECK: There is no palpable thyroid enlargement.  No thyroid nodule is palpable.  No palpable lymphadenopathy at the anterior neck. LUNGS:  Clear to auscultation  HEART:  Regular rate and rhythm without murmurs noted. Normal S1,S2.   Pulses: dorsalis pedis intact bilat.  No carotid bruit Feet: no deformity.  no ulcer on the feet.  feet are of normal color and temp.  no edema Neuro: sensation is intact to touch on the feet     Lab Results  Component Value Date   WBC 8.7 10/14/2010   HGB 15.9 10/14/2010   HCT 46.7 10/14/2010   PLT 233.0 10/14/2010   GLUCOSE 144* 07/05/2012   CHOL 113 07/05/2012   TRIG 29.0 07/05/2012   HDL 44.30 07/05/2012   LDLCALC 63 07/05/2012   ALT 45 10/14/2010   AST 34 10/14/2010   NA 139 07/05/2012   K 4.5 07/05/2012   CL 102 07/05/2012   CREATININE 1.0 07/05/2012   BUN 17 07/05/2012   CO2 29 07/05/2012   TSH 1.42 10/14/2010   PSA 1.32 10/14/2010   HGBA1C 6.8* 07/05/2012   MICROALBUR 1.5 10/21/2010      Assessment & Plan:  DM.   well-controlled Dyslipidemia, well-controlled Allergic rhinitis, needs increased rx

## 2012-10-15 ENCOUNTER — Telehealth: Payer: Self-pay | Admitting: Endocrinology

## 2012-10-15 MED ORDER — LORATADINE-PSEUDOEPHEDRINE ER 10-240 MG PO TB24: 1.0000 | ORAL_TABLET | Freq: Every day | ORAL | Status: DC | PRN

## 2012-10-15 NOTE — Telephone Encounter (Signed)
Pt would like a rx sent for Claritin D. States it's too hard to get OTC. [facility] @ [phone] located on [facility]. Call pt once complete.

## 2012-12-23 ENCOUNTER — Other Ambulatory Visit: Payer: Self-pay | Admitting: Endocrinology

## 2013-01-23 ENCOUNTER — Telehealth: Payer: Self-pay | Admitting: Endocrinology

## 2013-01-23 MED ORDER — LORATADINE-PSEUDOEPHEDRINE ER 10-240 MG PO TB24: 1.0000 | ORAL_TABLET | Freq: Every day | ORAL | Status: DC | PRN

## 2013-01-23 NOTE — Telephone Encounter (Signed)
Ov is due 

## 2013-01-23 NOTE — Telephone Encounter (Signed)
[facility] Triage Call Report Triage Record Num: [id number] Operator: Tomasita Crumble Patient Name: Antonio Patterson Call Date & Time: 01/22/2013 5:26:09PM Patient Phone: [phone] PCP: Romero Belling Patient Gender: Male PCP Fax : [phone] Patient DOB: [birth year] Practice Name: [facility] Reason for Call: Caller: Ettore/Patient; PCP: Romero Belling (Adults only); CB#: [phone]; Asks why his Rx for "generic Claritin D" was denied. Reports that he and his wife both take it and he usually gets refill order every year at time of his checkup. Advised caller to follow up with pharmacy and office during regular hours. Caller voiced understanding. Protocol(s) Used: Medication Questions - Adult Recommended Outcome per Protocol: Speak with Provider or Pharmacist within 24 hours Reason for Outcome: Older adult with questions about prescribed and/or nonprescribed medications not covered by available resources

## 2013-01-23 NOTE — Telephone Encounter (Signed)
Please refill prn 

## 2013-03-28 ENCOUNTER — Encounter: Payer: Self-pay | Admitting: Endocrinology

## 2013-03-28 ENCOUNTER — Ambulatory Visit (INDEPENDENT_AMBULATORY_CARE_PROVIDER_SITE_OTHER): Payer: Self-pay | Admitting: Endocrinology

## 2013-03-28 VITALS — BP 134/70 | HR 89 | Wt 207.0 lb

## 2013-03-28 DIAGNOSIS — Z125 Encounter for screening for malignant neoplasm of prostate: Secondary | ICD-10-CM

## 2013-03-28 DIAGNOSIS — E119 Type 2 diabetes mellitus without complications: Secondary | ICD-10-CM

## 2013-03-28 DIAGNOSIS — M549 Dorsalgia, unspecified: Secondary | ICD-10-CM

## 2013-03-28 MED ORDER — TRAMADOL HCL 50 MG PO TABS: 50.0000 mg | ORAL_TABLET | Freq: Three times a day (TID) | ORAL | Status: DC | PRN

## 2013-03-28 MED ORDER — LORATADINE-PSEUDOEPHEDRINE ER 10-240 MG PO TB24: 1.0000 | ORAL_TABLET | Freq: Every day | ORAL | Status: DC | PRN

## 2013-03-28 NOTE — Patient Instructions (Addendum)
blood tests are being requested for you today.  We'll contact you with results. Please come back for a follow-up appointment in 6 months.   I realize you do not have health insurance, but there are many other services you need for your health.  These include screening tests for colon, prostate and/or breast cancer; electrocardiogram, and others.  Please let me know if you want to get these important tests done.   i have sent a prescription to your pharmacy, for the arthritis pain.

## 2013-03-28 NOTE — Progress Notes (Signed)
Subjective:    Patient ID: Antonio Patterson, male    DOB: 14-Aug-1951, 62 y.o.   MRN: [id number]  HPI Pt returns for f/u of insulin-requiring DM (dx'ed 1997; complicated by PAD).  no cbg record, but states cbg's are well-controlled.  denies hypoglycemia.   Pt says he was seen by workers' comp for [employer].  Pain is worst at the knees.   Past Medical History  Diagnosis Date  . DIABETES MELLITUS, TYPE II 08/10/2007  . HYPERLIPIDEMIA 08/10/2007  . ERECTILE DYSFUNCTION 11/13/2008  . 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 HYPERTENSION 08/10/2007  . UNSPECIFIED PERIPHERAL VASCULAR DISEASE 11/13/2008  . ALLERGIC RHINITIS 08/10/2007  . BLADDER OUTLET OBSTRUCTION 11/13/2008  . PERS HX NONCOMPLIANCE W/MED TX PRS HAZARDS HLTH 11/13/2008  . Seborrhea 11/13/2008    Past Surgical History  Procedure Laterality Date  . Vasectomy  1976    History   Social History  . Marital Status: Married    Spouse Name: N/A    Number of Children: N/A  . Years of Education: N/A   Occupational History  .      Works for [employer]   Social History Main Topics  . Smoking status: Former Smoker    Quit date: 12/25/1985  . Smokeless tobacco: Not on file  . Alcohol Use: Yes     Comment: 6/week  . Drug Use: Not on file  . Sexually Active: Not on file   Other Topics Concern  . Not on file   Social History Narrative  . No narrative on file    Current Outpatient Prescriptions on File Prior to Visit  Medication Sig Dispense Refill  . aspirin 81 MG tablet Take 81 mg by mouth daily.        . fluticasone (FLONASE) 50 MCG/ACT nasal spray Place 2 sprays into the nose daily.  16 g  11  . glucose blood (RELION GLUCOSE TEST STRIPS) test strip Use as instructed       . insulin regular (NOVOLIN R,HUMULIN R) 100 units/mL injection Inject 0.3 mLs (30 Units total) into the skin 4 (four) times daily.  40 mL  12  . losartan-hydrochlorothiazide (HYZAAR) 100-25 MG per tablet TAKE 1/2 TABLET BY MOUTH ONCE A DAY   45 tablet  3  . metFORMIN (GLUCOPHAGE-XR) 500 MG 24 hr tablet Take 1 tablet (500 mg total) by mouth 2 (two) times daily.  180 tablet  3  . sildenafil (VIAGRA) 100 MG tablet Take 1 tablet (100 mg total) by mouth daily as needed.  30 tablet  11  . simvastatin (ZOCOR) 40 MG tablet TAKE 1 TABLET BY MOUTH EVERY EVENING  90 tablet  3  . Tamsulosin HCl (FLOMAX) 0.4 MG CAPS TAKE 1 CAPSULE BY MOUTH ONCE A DAY  90 capsule  3   No current facility-administered medications on file prior to visit.    Allergies  Allergen Reactions  . Metformin   . Rosuvastatin   . Sulfonamide Derivatives    Family History  Problem Relation Age of Onset  . Cancer Neg Hx     No cancer in immediate family  . Heart disease Father    BP 134/70  Pulse 89  Wt 207 lb (93.895 kg)  BMI 32.41 kg/m2  SpO2 96%  Review of Systems Denies chest pain and sob    Objective:   Physical Exam Pulses: dorsalis pedis intact bilat.   Feet: no deformity.  no ulcer on the feet.  feet are of normal color and temp.  no edema.  Neuro: sensation is intact to touch on the feet.    Lab Results  Component Value Date   HGBA1C 6.8* 03/28/2013      Assessment & Plan:  DM: well-controlled OA, new.

## 2013-05-28 ENCOUNTER — Other Ambulatory Visit: Payer: Self-pay | Admitting: Endocrinology

## 2013-06-28 ENCOUNTER — Other Ambulatory Visit: Payer: Self-pay | Admitting: Endocrinology

## 2013-06-30 ENCOUNTER — Other Ambulatory Visit: Payer: Self-pay | Admitting: *Deleted

## 2013-06-30 MED ORDER — TAMSULOSIN HCL 0.4 MG PO CAPS: ORAL_CAPSULE | ORAL | Status: DC

## 2013-07-04 ENCOUNTER — Other Ambulatory Visit: Payer: Self-pay | Admitting: *Deleted

## 2013-07-04 MED ORDER — TAMSULOSIN HCL 0.4 MG PO CAPS: ORAL_CAPSULE | ORAL | Status: DC

## 2013-09-22 ENCOUNTER — Other Ambulatory Visit: Payer: Self-pay | Admitting: *Deleted

## 2013-09-22 MED ORDER — LOSARTAN POTASSIUM-HCTZ 100-25 MG PO TABS: ORAL_TABLET | ORAL | Status: DC

## 2013-09-24 ENCOUNTER — Other Ambulatory Visit: Payer: Self-pay

## 2013-09-24 MED ORDER — LOSARTAN POTASSIUM-HCTZ 100-25 MG PO TABS: ORAL_TABLET | ORAL | Status: DC

## 2013-09-26 ENCOUNTER — Other Ambulatory Visit: Payer: Self-pay

## 2013-09-26 MED ORDER — LOSARTAN POTASSIUM-HCTZ 100-25 MG PO TABS: ORAL_TABLET | ORAL | Status: DC

## 2013-10-03 ENCOUNTER — Other Ambulatory Visit: Payer: Self-pay | Admitting: Endocrinology

## 2013-10-03 ENCOUNTER — Ambulatory Visit: Payer: Self-pay | Admitting: Endocrinology

## 2013-10-03 DIAGNOSIS — Z0289 Encounter for other administrative examinations: Secondary | ICD-10-CM

## 2013-10-06 ENCOUNTER — Other Ambulatory Visit: Payer: Self-pay | Admitting: *Deleted

## 2013-10-06 MED ORDER — METFORMIN HCL ER 500 MG PO TB24: ORAL_TABLET | ORAL | Status: DC

## 2013-10-06 MED ORDER — LOSARTAN POTASSIUM-HCTZ 100-25 MG PO TABS: ORAL_TABLET | ORAL | Status: DC

## 2013-10-07 ENCOUNTER — Telehealth: Payer: Self-pay | Admitting: Endocrinology

## 2013-10-07 MED ORDER — LOSARTAN POTASSIUM-HCTZ 100-25 MG PO TABS: ORAL_TABLET | ORAL | Status: DC

## 2013-10-07 NOTE — Telephone Encounter (Signed)
Spoke with pt and rx for losartan was sent to [facility]

## 2013-10-31 ENCOUNTER — Ambulatory Visit (INDEPENDENT_AMBULATORY_CARE_PROVIDER_SITE_OTHER): Payer: Self-pay | Admitting: Endocrinology

## 2013-10-31 ENCOUNTER — Encounter: Payer: Self-pay | Admitting: Endocrinology

## 2013-10-31 VITALS — BP 120/80 | HR 69 | Temp 98.5°F | Ht 67.0 in | Wt 211.1 lb

## 2013-10-31 DIAGNOSIS — I10 Essential (primary) hypertension: Secondary | ICD-10-CM

## 2013-10-31 DIAGNOSIS — R519 Headache, unspecified: Secondary | ICD-10-CM | POA: Insufficient documentation

## 2013-10-31 DIAGNOSIS — E119 Type 2 diabetes mellitus without complications: Secondary | ICD-10-CM

## 2013-10-31 DIAGNOSIS — E785 Hyperlipidemia, unspecified: Secondary | ICD-10-CM

## 2013-10-31 DIAGNOSIS — R51 Headache: Secondary | ICD-10-CM

## 2013-10-31 LAB — LIPID PANEL
Cholesterol: 113 mg/dL (ref 0–200)
HDL: 43.1 mg/dL (ref >39.00)
LDL Cholesterol: 64 mg/dL (ref 0–99)
VLDL: 6 mg/dL (ref 0.0–40.0)

## 2013-10-31 LAB — BASIC METABOLIC PANEL
BUN: 16 mg/dL (ref 6–23)
GFR: 77.43 mL/min (ref >60.00)
Potassium: 3.9 mEq/L (ref 3.5–5.1)
Sodium: 138 mEq/L (ref 135–145)

## 2013-10-31 LAB — SEDIMENTATION RATE: Sed Rate: 5 mm/hr (ref 0–22)

## 2013-10-31 NOTE — Patient Instructions (Addendum)
Try putting clotrimazole and hydrocortisone creams on your feet. blood tests are being requested for you today.  We'll contact you with results. Please take the 70/30 insulin, 40 units, twice a day, with the first and last meals of the day.   Please come back for a follow-up appointment in 6 months. I realize you do not have health insurance, but there are many other services you need for your health.  These include screening tests for colon cancer; electrocardiogram, and others.  Please let me know if you want to get these important tests done.

## 2013-10-31 NOTE — Progress Notes (Signed)
Subjective:    Patient ID: Antonio Patterson, male    DOB: 05-29-1951, 62 y.o.   MRN: [id number]  HPI Pt returns for f/u of insulin-requiring DM (dx'ed 1997, on a routine blood test; he has mild if any neuropathy of the lower extremities, but he has associated PAD; he has never had severe hypoglycemia or DKA).  no cbg record, but states cbg's are well-controlled.  pt states he feels well in general. He wants to change over to 70/30 insulin.   Past Medical History  Diagnosis Date  . DIABETES MELLITUS, TYPE II 08/10/2007  . HYPERLIPIDEMIA 08/10/2007  . ERECTILE DYSFUNCTION 11/13/2008  . 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 HYPERTENSION 08/10/2007  . UNSPECIFIED PERIPHERAL VASCULAR DISEASE 11/13/2008  . ALLERGIC RHINITIS 08/10/2007  . BLADDER OUTLET OBSTRUCTION 11/13/2008  . PERS HX NONCOMPLIANCE W/MED TX PRS HAZARDS HLTH 11/13/2008  . Seborrhea 11/13/2008    Past Surgical History  Procedure Laterality Date  . Vasectomy  1976    History   Social History  . Marital Status: Married    Spouse Name: N/A    Number of Children: N/A  . Years of Education: N/A   Occupational History  .      Works for [employer]   Social History Main Topics  . Smoking status: Former Smoker    Quit date: 12/25/1985  . Smokeless tobacco: Not on file  . Alcohol Use: Yes     Comment: 6/week  . Drug Use: Not on file  . Sexual Activity: Not on file   Other Topics Concern  . Not on file   Social History Narrative  . No narrative on file    Current Outpatient Prescriptions on File Prior to Visit  Medication Sig Dispense Refill  . aspirin 81 MG tablet Take 81 mg by mouth daily.        . fluticasone (FLONASE) 50 MCG/ACT nasal spray Place 2 sprays into the nose daily.  16 g  11  . glucose blood (RELION GLUCOSE TEST STRIPS) test strip Use as instructed       . loratadine-pseudoephedrine (CLARITIN-D 24 HOUR) 10-240 MG per 24 hr tablet Take 1 tablet by mouth daily as needed for  allergies.  90 tablet  6  . losartan-hydrochlorothiazide (HYZAAR) 100-25 MG per tablet TAKE 1/2 TABLET BY MOUTH ONCE A DAY  45 tablet  2  . sildenafil (VIAGRA) 100 MG tablet Take 1 tablet (100 mg total) by mouth daily as needed.  30 tablet  11  . simvastatin (ZOCOR) 40 MG tablet TAKE 1 TABLET BY MOUTH EVERY EVENING  90 tablet  3  . tamsulosin (FLOMAX) 0.4 MG CAPS TAKE 1 CAPSULE BY MOUTH ONCE A DAY  90 capsule  1  . traMADol (ULTRAM) 50 MG tablet Take 1 tablet (50 mg total) by mouth every 8 (eight) hours as needed for pain.  50 tablet  5   No current facility-administered medications on file prior to visit.   Allergies  Allergen Reactions  . Metformin   . Rosuvastatin   . Sulfonamide Derivatives    Family History  Problem Relation Age of Onset  . Cancer Neg Hx     No cancer in immediate family  . Heart disease Father    BP 120/80  Pulse 69  Temp(Src) 98.5 F (36.9 C) (Oral)  Ht 5\' 7"  (1.702 m)  Wt 211 lb 1.6 oz (95.754 kg)  BMI 33.06 kg/m2  SpO2 94%  Review of Systems denies hypoglycemia.  He  has a slight headache.      Objective:   Physical Exam VITAL SIGNS:  See vs page GENERAL: no distress head: no deformity eyes: no periorbital swelling, no proptosis external nose and ears are normal mouth: no lesion seen Both eac's and tm's are normal  Lab Results  Component Value Date   CHOL 113 10/31/2013   HDL 43.10 10/31/2013   LDLCALC 64 10/31/2013   TRIG 30.0 10/31/2013   CHOLHDL 3 10/31/2013   Lab Results  Component Value Date   CREATININE 1.0 10/31/2013   BUN 16 10/31/2013   NA 138 10/31/2013   K 3.9 10/31/2013   CL 103 10/31/2013   CO2 30 10/31/2013      Assessment & Plan:  Dyslipidemia: well-controlled DM: pt wants to take a simpler regimen Headache, new, uncertain etiology. Foot rash, new, uncertain etiology.

## 2013-12-22 ENCOUNTER — Other Ambulatory Visit: Payer: Self-pay | Admitting: Endocrinology

## 2014-03-23 ENCOUNTER — Other Ambulatory Visit: Payer: Self-pay | Admitting: Endocrinology

## 2014-05-01 ENCOUNTER — Ambulatory Visit: Payer: Self-pay | Admitting: Endocrinology

## 2014-06-25 ENCOUNTER — Other Ambulatory Visit: Payer: Self-pay | Admitting: Endocrinology

## 2014-06-29 ENCOUNTER — Other Ambulatory Visit: Payer: Self-pay

## 2014-06-29 MED ORDER — LOSARTAN POTASSIUM-HCTZ 100-25 MG PO TABS: ORAL_TABLET | ORAL | Status: DC

## 2014-07-14 ENCOUNTER — Telehealth: Payer: Self-pay

## 2014-07-14 NOTE — Telephone Encounter (Signed)
Diabetic Bundle pt  Pt to call back and schedule follow up appt with PCP

## 2014-08-28 ENCOUNTER — Encounter: Payer: Self-pay | Admitting: Endocrinology

## 2014-08-28 ENCOUNTER — Ambulatory Visit (INDEPENDENT_AMBULATORY_CARE_PROVIDER_SITE_OTHER): Payer: Self-pay | Admitting: Endocrinology

## 2014-08-28 VITALS — BP 134/70 | HR 75 | Temp 97.8°F | Ht 67.0 in | Wt 213.0 lb

## 2014-08-28 DIAGNOSIS — Z125 Encounter for screening for malignant neoplasm of prostate: Secondary | ICD-10-CM

## 2014-08-28 DIAGNOSIS — E119 Type 2 diabetes mellitus without complications: Secondary | ICD-10-CM

## 2014-08-28 LAB — PSA: PSA: 2.04 ng/mL (ref 0.10–4.00)

## 2014-08-28 LAB — HEMOGLOBIN A1C: HEMOGLOBIN A1C: 7.1 % — AB (ref 4.6–6.5)

## 2014-08-28 NOTE — Progress Notes (Signed)
Subjective:    Patient ID: Antonio Patterson, male    DOB: 1951/04/16, 63 y.o.   MRN: [id number]  HPI Pt returns for f/u of insulin-requiring DM (dx'ed 1997, on a routine blood test; complicated by PAD; he has been on insulin since 2009; he has never had pancreatitis, severe hypoglycemia, or DKA).  no cbg record, but states cbg's are well-controlled.  pt states he feels well in general.   Past Medical History  Diagnosis Date  . DIABETES MELLITUS, TYPE II 08/10/2007  . HYPERLIPIDEMIA 08/10/2007  . ERECTILE DYSFUNCTION 11/13/2008  . 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 HYPERTENSION 08/10/2007  . UNSPECIFIED PERIPHERAL VASCULAR DISEASE 11/13/2008  . ALLERGIC RHINITIS 08/10/2007  . BLADDER OUTLET OBSTRUCTION 11/13/2008  . PERS HX NONCOMPLIANCE W/MED TX PRS HAZARDS HLTH 11/13/2008  . Seborrhea 11/13/2008    Past Surgical History  Procedure Laterality Date  . Vasectomy  1976    History   Social History  . Marital Status: Married    Spouse Name: N/A    Number of Children: N/A  . Years of Education: N/A   Occupational History  .      Works for [employer]   Social History Main Topics  . Smoking status: Former Smoker    Quit date: 12/25/1985  . Smokeless tobacco: Not on file  . Alcohol Use: Yes     Comment: 6/week  . Drug Use: Not on file  . Sexual Activity: Not on file   Other Topics Concern  . Not on file   Social History Narrative  . No narrative on file    Current Outpatient Prescriptions on File Prior to Visit  Medication Sig Dispense Refill  . aspirin 81 MG tablet Take 81 mg by mouth daily.        Marland Kitchen glucose blood (RELION GLUCOSE TEST STRIPS) test strip Use as instructed       . GNP LORATADINE-D 24 HOUR 10-240 MG per 24 hr tablet TAKE ONE TABLET BY MOUTH ONCE DAILY AS NEEDED FOR ALLERGY  90 tablet  0  . insulin NPH-regular (NOVOLIN 70/30) (70-30) 100 UNIT/ML injection Inject 40 Units into the skin 2 (two) times daily with a meal.       .  losartan-hydrochlorothiazide (HYZAAR) 100-25 MG per tablet TAKE 1/2 TABLET BY MOUTH ONCE A DAY  45 tablet  0  . sildenafil (VIAGRA) 100 MG tablet Take 1 tablet (100 mg total) by mouth daily as needed.  30 tablet  11  . simvastatin (ZOCOR) 40 MG tablet TAKE 1 TABLET BY MOUTH EVERY EVENING  90 tablet  2  . tamsulosin (FLOMAX) 0.4 MG CAPS TAKE 1 CAPSULE BY MOUTH ONCE A DAY  90 capsule  1  . traMADol (ULTRAM) 50 MG tablet Take 1 tablet (50 mg total) by mouth every 8 (eight) hours as needed for pain.  50 tablet  5  . fluticasone (FLONASE) 50 MCG/ACT nasal spray Place 2 sprays into the nose daily.  16 g  11   No current facility-administered medications on file prior to visit.    Allergies  Allergen Reactions  . Metformin   . Rosuvastatin   . Sulfonamide Derivatives     Family History  Problem Relation Age of Onset  . Cancer Neg Hx     No cancer in immediate family  . Heart disease Father     BP 134/70  Pulse 75  Temp(Src) 97.8 F (36.6 C) (Oral)  Ht  (1.702 m)  Wt  213 lb (96.616 kg)  BMI 33.35 kg/m2  SpO2 95%    Review of Systems He denies hypoglycemia.  He has gained weight.      Objective:   Physical Exam VITAL SIGNS:  See vs page GENERAL: no distress Pulses: dorsalis pedis intact bilat.   Feet: no deformity. normal color and temp.  Trace bilat leg edema Skin:  no ulcer on the feet.  Moderate czematous rash Neuro: sensation is intact to touch on the feet   Lab Results  Component Value Date   HGBA1C 7.1* 08/28/2014       Assessment & Plan:  DM: well-controlled Rash, recurrent, fungal vs hypersensitivity Weight gain: this complicates there x of DM.  Patient is advised the following: Patient Instructions  Try putting clotrimazole and hydrocortisone creams on your feet. blood tests are being requested for you today.  We'll contact you with results. Please take the 70/30 insulin, 40 units, twice a day, with the first and last meals of the day.   Please come  back for a follow-up appointment next year, when you get medicare. I realize you do not have health insurance, but there are many other services you need for your health.  These include screening tests for colon cancer; electrocardiogram, and others.  Please let me know if you want to get these important tests done.

## 2014-08-28 NOTE — Patient Instructions (Addendum)
Try putting clotrimazole and hydrocortisone creams on your feet. blood tests are being requested for you today.  We'll contact you with results. Please take the 70/30 insulin, 40 units, twice a day, with the first and last meals of the day.   Please come back for a follow-up appointment next year, when you get medicare. I realize you do not have health insurance, but there are many other services you need for your health.  These include screening tests for colon cancer; electrocardiogram, and others.  Please let me know if you want to get these important tests done.

## 2014-09-20 ENCOUNTER — Other Ambulatory Visit: Payer: Self-pay | Admitting: Endocrinology

## 2014-10-17 ENCOUNTER — Ambulatory Visit (INDEPENDENT_AMBULATORY_CARE_PROVIDER_SITE_OTHER): Payer: BC Managed Care – PPO | Admitting: Family Medicine

## 2014-10-17 ENCOUNTER — Ambulatory Visit (INDEPENDENT_AMBULATORY_CARE_PROVIDER_SITE_OTHER): Payer: BC Managed Care – PPO

## 2014-10-17 VITALS — BP 132/78 | HR 88 | Temp 98.7°F | Resp 16 | Ht 65.0 in | Wt 213.0 lb

## 2014-10-17 DIAGNOSIS — S52501A Unspecified fracture of the lower end of right radius, initial encounter for closed fracture: Secondary | ICD-10-CM

## 2014-10-17 DIAGNOSIS — M25531 Pain in right wrist: Secondary | ICD-10-CM

## 2014-10-17 DIAGNOSIS — S6991XA Unspecified injury of right wrist, hand and finger(s), initial encounter: Secondary | ICD-10-CM

## 2014-10-17 NOTE — Progress Notes (Signed)
Sugar tong splint placed.

## 2014-10-17 NOTE — Patient Instructions (Signed)
We will work on obtaining an appointment for you with Dr. Melvyn Novasrtmann or Dr. Amanda PeaGramig for your wrist fracture. Keep splint in place until seen by their office. Avoid use of this wrist.   Tylenol or ibuprofen if needed for pain. Return to the clinic or go to the nearest emergency room if any of your symptoms worsen or new symptoms occur.

## 2014-10-17 NOTE — Progress Notes (Signed)
Subjective:    Patient ID: Antonio SiadSteve D Bui, male    DOB: 1951-06-28, 63 y.o.   MRN: [id number]  Wrist Injury    Chief Complaint  Patient presents with  . Wrist Injury    Right wrist. Possible fracture. Pt. injured it on the 16th around 5pm. Pain and swelling. Tender to touch in some places.   This chart was scribed for Meredith StaggersJeffrey Noely Kuhnle, MD by Andrew Auaven Small, ED Scribe. This patient was seen in room 9 and the patient's care was started at 11:12 AM.  HPI Comments: Antonio Patterson is a 63 y.o. male who presents to the [facility] complaining of right wrist injury that occurred about 8 days ago. Pt states he was getting off a boat, lost his balance, missteped and FOSH. Pt reports after fall he noticed swelling and bruising to forearm and wrist. Pt has applied ice and has taken ibuprofen as needed. Pt reports improvement everyday but still has pain and bruising to wrist. Pt reports hx tendonitis at the base of right thumb but denies hx broken wrist. Pt is right hand dominate.   Patient Active Problem List   Diagnosis Date Noted  . Headache(784.0) 10/31/2013  . Screening for prostate cancer 03/28/2013  . ABNORMAL ELECTROCARDIOGRAM 10/21/2010  . 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 ERECTILE DYSFUNCTION 11/13/2008  . ALCOHOL USE 11/13/2008  . UNSPECIFIED PERIPHERAL VASCULAR DISEASE 11/13/2008  . BLADDER OUTLET OBSTRUCTION 11/13/2008  . SEBORRHEA 11/13/2008  . PERS HX NONCOMPLIANCE W/MED TX PRS HAZARDS HLTH 11/13/2008  . DIABETES MELLITUS, TYPE II 08/10/2007  . HYPERLIPIDEMIA 08/10/2007  . HYPERTENSION 08/10/2007  . ALLERGIC RHINITIS 08/10/2007   Past Medical History  Diagnosis Date  . DIABETES MELLITUS, TYPE II 08/10/2007  . HYPERLIPIDEMIA 08/10/2007  . ERECTILE DYSFUNCTION 11/13/2008  . 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 HYPERTENSION 08/10/2007  . UNSPECIFIED PERIPHERAL VASCULAR DISEASE 11/13/2008  . ALLERGIC RHINITIS 08/10/2007  . BLADDER OUTLET  OBSTRUCTION 11/13/2008  . PERS HX NONCOMPLIANCE W/MED TX PRS HAZARDS HLTH 11/13/2008  . Seborrhea 11/13/2008   Past Surgical History  Procedure Laterality Date  . Vasectomy  1976   Allergies  Allergen Reactions  . Metformin   . Rosuvastatin   . Sulfonamide Derivatives    Prior to Admission medications   Medication Sig Start Date End Date Taking? Authorizing Provider  aspirin 81 MG tablet Take 81 mg by mouth daily.     Yes Historical Provider, MD  glucose blood (RELION GLUCOSE TEST STRIPS) test strip Use as instructed    Yes Historical Provider, MD  insulin NPH-regular (NOVOLIN 70/30) (70-30) 100 UNIT/ML injection Inject 40 Units into the skin 2 (two) times daily with a meal.    Yes Historical Provider, MD  LORATADINE-D 24HR 10-240 MG per 24 hr tablet TAKE ONE TABLET BY MOUTH ONCE DAILY AS NEEDED 09/21/14  Yes Romero BellingSean Ellison, MD  losartan-hydrochlorothiazide (HYZAAR) 100-25 MG per tablet TAKE ONE-HALF TABLET BY MOUTH ONCE DAILY 09/21/14  Yes Romero BellingSean Ellison, MD  simvastatin (ZOCOR) 40 MG tablet TAKE 1 TABLET BY MOUTH EVERY EVENING 09/21/14  Yes Romero BellingSean Ellison, MD  tamsulosin (FLOMAX) 0.4 MG CAPS capsule TAKE ONE CAPSULE BY MOUTH ONCE DAILY 09/21/14  Yes Romero BellingSean Ellison, MD  fluticasone (FLONASE) 50 MCG/ACT nasal spray Place 2 sprays into the nose daily. 07/05/12 10/31/13  Romero BellingSean Ellison, MD  sildenafil (VIAGRA) 100 MG tablet Take 1 tablet (100 mg total) by mouth daily as needed. 07/05/12   Romero BellingSean Ellison, MD  traMADol (ULTRAM) 50 MG  tablet Take 1 tablet (50 mg total) by mouth every 8 (eight) hours as needed for pain. 03/28/13   Romero BellingSean Ellison, MD   History   Social History  . Marital Status: Married    Spouse Name: N/A    Number of Children: N/A  . Years of Education: N/A   Occupational History  .      Works for [employer]   Social History Main Topics  . Smoking status: Former Smoker    Quit date: 12/25/1985  . Smokeless tobacco: Not on file  . Alcohol Use: Yes     Comment: 6/week    . Drug Use: Not on file  . Sexual Activity: Not on file   Other Topics Concern  . Not on file   Social History Narrative  . No narrative on file   Review of Systems  Musculoskeletal: Positive for arthralgias and myalgias.    Objective:   Physical Exam  Nursing note and vitals reviewed. Constitutional: He is oriented to person, place, and time. He appears well-developed and well-nourished. No distress.  HENT:  Head: Normocephalic and atraumatic.  Eyes: Conjunctivae and EOM are normal.  Neck: Neck supple.  Cardiovascular: Normal rate.   Pulmonary/Chest: Effort normal.  Musculoskeletal: Normal range of motion.  Right elbow- full ROM non tender.  Right forearm- radial head non tender. Tender along distal radius. not DRUJ. Not ulnar styloid. Right wrist- scaphoid non tender. Skin intact. ecchymoyois half way up volar forearm that extends to volar wirst.Slightly guarded ROM at wrist. NVI distally.  Equal grip strength.   Neurological: He is alert and oriented to person, place, and time.  Skin: Skin is warm and dry.  Psychiatric: He has a normal mood and affect. His behavior is normal.    Filed Vitals:   10/17/14 1038  BP: 132/78  Pulse: 88  Temp: 98.7 F (37.1 C)  TempSrc: Oral  Resp: 16  Height: 5\' 5"  (1.651 m)  Weight: 213 lb (96.616 kg)  SpO2: 95%   [facility] reading (PRIMARY) by Dr. Neva SeatGreene. Distal radius fracture that extends to radiocarpal joint effecting approximately 1/3 of the joint surface with significant displacement.  Assessment & Plan:   Antonio SiadSteve D Hopfer is a 63 y.o. male Right wrist pain - Plan: DG Wrist Complete Right, Ambulatory referral to Hand Surgery, CANCELED: Ambulatory referral to Hand Surgery  Injury, wrist, right, initial encounter - Plan: Ambulatory referral to Hand Surgery, CANCELED: Ambulatory referral to Hand Surgery  Distal radius fracture, right, closed, initial encounter - Plan: Ambulatory referral to Hand Surgery, CANCELED: Ambulatory  referral to Hand Surgery  Distal radius fracture - nondisplaced, but appears to be articular. 8 days out from injury and as been using hand/wrist.   - sugar tong splint and sling applied.   - refer to hand surgeon this week.   -discussed avoiding use of wrist, rtc precautions.   No orders of the defined types were placed in this encounter.   Patient Instructions  We will work on obtaining an appointment for you with Dr. Melvyn Novasrtmann or Dr. Amanda PeaGramig for your wrist fracture. Keep splint in place until seen by their office. Avoid use of this wrist.   Tylenol or ibuprofen if needed for pain. Return to the clinic or go to the nearest emergency room if any of your symptoms worsen or new symptoms occur.       I personally performed the services described in this documentation, which was scribed in my presence. The recorded information has been  reviewed and considered, and addended by me as needed.

## 2014-12-14 ENCOUNTER — Other Ambulatory Visit: Payer: Self-pay | Admitting: Endocrinology

## 2014-12-21 ENCOUNTER — Other Ambulatory Visit: Payer: Self-pay | Admitting: Endocrinology

## 2014-12-31 ENCOUNTER — Telehealth: Payer: Self-pay | Admitting: Endocrinology

## 2014-12-31 NOTE — Telephone Encounter (Signed)
Pt needs a refill on a med for his arthritis pain please call into [facility]

## 2015-01-01 MED ORDER — TRAMADOL HCL 50 MG PO TABS: 50.0000 mg | ORAL_TABLET | Freq: Three times a day (TID) | ORAL | Status: DC | PRN

## 2015-01-01 NOTE — Telephone Encounter (Signed)
i printed 

## 2015-01-01 NOTE — Telephone Encounter (Signed)
Rx faxed to pharmacy  

## 2015-01-04 ENCOUNTER — Other Ambulatory Visit: Payer: Self-pay | Admitting: Endocrinology

## 2015-03-15 ENCOUNTER — Other Ambulatory Visit: Payer: Self-pay | Admitting: Endocrinology

## 2015-03-22 ENCOUNTER — Other Ambulatory Visit: Payer: Self-pay | Admitting: Endocrinology

## 2015-03-23 ENCOUNTER — Other Ambulatory Visit: Payer: Self-pay

## 2015-03-23 ENCOUNTER — Telehealth: Payer: Self-pay | Admitting: Endocrinology

## 2015-03-23 MED ORDER — LORATADINE-PSEUDOEPHEDRINE ER 10-240 MG PO TB24: ORAL_TABLET | ORAL | Status: DC

## 2015-03-23 NOTE — Telephone Encounter (Signed)
Rx sent per request. 

## 2015-03-23 NOTE — Telephone Encounter (Signed)
Patient stated that he need Authorization for meds Loratadine D 24 hr

## 2015-03-30 ENCOUNTER — Telehealth: Payer: Self-pay | Admitting: Endocrinology

## 2015-03-30 MED ORDER — LORATADINE-PSEUDOEPHEDRINE ER 10-240 MG PO TB24: ORAL_TABLET | ORAL | Status: DC

## 2015-03-30 NOTE — Telephone Encounter (Signed)
Rx sent to pharmacy per request.  

## 2015-03-30 NOTE — Telephone Encounter (Signed)
Patient asked to send medication loratadine-pseudoephedrine (LORATADINE-D 24HR) 10-240 MG per 24 hr tablet 90 tabs, [facility] to [facility]

## 2015-04-05 ENCOUNTER — Other Ambulatory Visit: Payer: Self-pay | Admitting: Endocrinology

## 2015-06-07 ENCOUNTER — Other Ambulatory Visit: Payer: Self-pay | Admitting: Endocrinology

## 2015-06-08 ENCOUNTER — Telehealth: Payer: Self-pay | Admitting: Endocrinology

## 2015-06-08 MED ORDER — INSULIN NPH ISOPHANE & REGULAR (70-30) 100 UNIT/ML ~~LOC~~ SUSP: SUBCUTANEOUS | Status: DC

## 2015-06-08 NOTE — Telephone Encounter (Signed)
Rx sent per pt's request. Appointment letter mailed to the pt advising him to make a follow up visit within the next 30 days.

## 2015-06-08 NOTE — Telephone Encounter (Signed)
Patient called and would like a refill on his medication   Rx: Novolin 70/30  Pharmacy: [facility]    Thank you

## 2015-06-30 ENCOUNTER — Telehealth: Payer: Self-pay

## 2015-06-30 MED ORDER — SIMVASTATIN 40 MG PO TABS: 40.0000 mg | ORAL_TABLET | Freq: Every evening | ORAL | Status: DC

## 2015-06-30 NOTE — Telephone Encounter (Signed)
Pt is requesting a refill on his simvastatin. Pt's last office visit was 08/29/2014.  Please advise if ok to refill. Thanks!

## 2015-06-30 NOTE — Telephone Encounter (Signed)
Rx sent for a 30 day supply.  

## 2015-06-30 NOTE — Telephone Encounter (Signed)
Please refill x 1  

## 2015-07-09 ENCOUNTER — Encounter: Payer: Self-pay | Admitting: Endocrinology

## 2015-07-09 ENCOUNTER — Telehealth: Payer: Self-pay | Admitting: Endocrinology

## 2015-07-09 ENCOUNTER — Ambulatory Visit (INDEPENDENT_AMBULATORY_CARE_PROVIDER_SITE_OTHER): Payer: Medicare Other | Admitting: Endocrinology

## 2015-07-09 VITALS — BP 132/72 | HR 80 | Temp 98.2°F | Ht 65.0 in | Wt 209.0 lb

## 2015-07-09 DIAGNOSIS — E119 Type 2 diabetes mellitus without complications: Secondary | ICD-10-CM

## 2015-07-09 DIAGNOSIS — Z125 Encounter for screening for malignant neoplasm of prostate: Secondary | ICD-10-CM

## 2015-07-09 DIAGNOSIS — I10 Essential (primary) hypertension: Secondary | ICD-10-CM

## 2015-07-09 DIAGNOSIS — E785 Hyperlipidemia, unspecified: Secondary | ICD-10-CM

## 2015-07-09 DIAGNOSIS — Z0000 Encounter for general adult medical examination without abnormal findings: Secondary | ICD-10-CM

## 2015-07-09 LAB — LIPID PANEL
Cholesterol: 122 mg/dL (ref 0–200)
HDL: 45.8 mg/dL (ref >39.00)
LDL Cholesterol: 69 mg/dL (ref 0–99)
NonHDL: 76.2
Total CHOL/HDL Ratio: 3
Triglycerides: 36 mg/dL (ref 0.0–149.0)
VLDL: 7.2 mg/dL (ref 0.0–40.0)

## 2015-07-09 LAB — MICROALBUMIN / CREATININE URINE RATIO
Creatinine,U: 123.4 mg/dL
Microalb Creat Ratio: 0.6 mg/g (ref 0.0–30.0)
Microalb, Ur: <0.7 mg/dL (ref 0.0–1.9)

## 2015-07-09 LAB — CBC WITH DIFFERENTIAL/PLATELET
BASOS ABS: 0 10*3/uL (ref 0.0–0.1)
Basophils Relative: 0.3 % (ref 0.0–3.0)
EOS PCT: 3.1 % (ref 0.0–5.0)
Eosinophils Absolute: 0.3 10*3/uL (ref 0.0–0.7)
HCT: 48.7 % (ref 39.0–52.0)
HEMOGLOBIN: 16 g/dL (ref 13.0–17.0)
LYMPHS ABS: 2.5 10*3/uL (ref 0.7–4.0)
Lymphocytes Relative: 25.5 % (ref 12.0–46.0)
MCHC: 32.9 g/dL (ref 30.0–36.0)
MCV: 87.3 fl (ref 78.0–100.0)
MONO ABS: 0.7 10*3/uL (ref 0.1–1.0)
MONOS PCT: 6.8 % (ref 3.0–12.0)
NEUTROS ABS: 6.3 10*3/uL (ref 1.4–7.7)
Neutrophils Relative %: 64.3 % (ref 43.0–77.0)
Platelets: 250 10*3/uL (ref 150.0–400.0)
RBC: 5.59 Mil/uL (ref 4.22–5.81)
RDW: 14.4 % (ref 11.5–15.5)
WBC: 9.8 10*3/uL (ref 4.0–10.5)

## 2015-07-09 LAB — URINALYSIS, ROUTINE W REFLEX MICROSCOPIC
BILIRUBIN URINE: NEGATIVE
HGB URINE DIPSTICK: NEGATIVE
Ketones, ur: NEGATIVE
LEUKOCYTES UA: NEGATIVE
NITRITE: NEGATIVE
PH: 6 (ref 5.0–8.0)
RBC / HPF: NONE SEEN (ref 0–?)
Specific Gravity, Urine: 1.02 (ref 1.000–1.030)
TOTAL PROTEIN, URINE-UPE24: NEGATIVE
URINE GLUCOSE: NEGATIVE
Urobilinogen, UA: 1 (ref 0.0–1.0)

## 2015-07-09 LAB — HEPATIC FUNCTION PANEL
ALBUMIN: 4.4 g/dL (ref 3.5–5.2)
ALT: 30 U/L (ref 0–53)
AST: 25 U/L (ref 0–37)
Alkaline Phosphatase: 69 U/L (ref 39–117)
BILIRUBIN TOTAL: 0.9 mg/dL (ref 0.2–1.2)
Bilirubin, Direct: 0.2 mg/dL (ref 0.0–0.3)
Total Protein: 7.6 g/dL (ref 6.0–8.3)

## 2015-07-09 LAB — BASIC METABOLIC PANEL
BUN: 17 mg/dL (ref 6–23)
CALCIUM: 9.8 mg/dL (ref 8.4–10.5)
CO2: 30 meq/L (ref 19–32)
CREATININE: 1 mg/dL (ref 0.40–1.50)
Chloride: 101 mEq/L (ref 96–112)
GFR: 79.69 mL/min (ref >60.00)
Glucose, Bld: 105 mg/dL — ABNORMAL HIGH (ref 70–99)
POTASSIUM: 3.9 meq/L (ref 3.5–5.1)
SODIUM: 137 meq/L (ref 135–145)

## 2015-07-09 LAB — POCT GLYCOSYLATED HEMOGLOBIN (HGB A1C): Hemoglobin A1C: 6.5

## 2015-07-09 LAB — PSA, MEDICARE: PSA: 1.74 ng/mL (ref 0.10–4.00)

## 2015-07-09 LAB — TSH: TSH: 1.18 u[IU]/mL (ref 0.35–4.50)

## 2015-07-09 MED ORDER — INSULIN NPH ISOPHANE & REGULAR (70-30) 100 UNIT/ML ~~LOC~~ SUSP: 45.0000 [IU] | Freq: Two times a day (BID) | SUBCUTANEOUS | Status: DC

## 2015-07-09 MED ORDER — TRAMADOL HCL 50 MG PO TABS: 50.0000 mg | ORAL_TABLET | Freq: Three times a day (TID) | ORAL | Status: DC | PRN

## 2015-07-09 NOTE — Progress Notes (Signed)
we discussed code status.  pt requests full code, but would not want to be started or maintained on artificial life-support measures if there was not a reasonable chance of recovery 

## 2015-07-09 NOTE — Telephone Encounter (Signed)
I contacted [facility] and gave the verbal on his Claritin.

## 2015-07-09 NOTE — Progress Notes (Signed)
Subjective:    Patient ID: Antonio Patterson, male    DOB: Jun 27, 1951, 64 y.o.   MRN: [id number]  HPI Pt returns for f/u of diabetes mellitus: DM type: Insulin-requiring type 2 Dx'ed: 1997 Complications: PAD Therapy: insulin since 2009 DKA: never Severe hypoglycemia: never Pancreatitis: never Other: he has chosen a bid insulin regimen.  Interval history: He takes a varying dosage insulin, for a total of approx 100 units total per day.  no cbg record, but states cbg's are highest at hs, and lowest in am.  He seldom has hypoglycemia, and these episodes are mild.   Past Medical History  Diagnosis Date  . DIABETES MELLITUS, TYPE II 08/10/2007  . HYPERLIPIDEMIA 08/10/2007  . ERECTILE DYSFUNCTION 11/13/2008  . 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 HYPERTENSION 08/10/2007  . UNSPECIFIED PERIPHERAL VASCULAR DISEASE 11/13/2008  . ALLERGIC RHINITIS 08/10/2007  . BLADDER OUTLET OBSTRUCTION 11/13/2008  . PERS HX NONCOMPLIANCE W/MED TX PRS HAZARDS HLTH 11/13/2008  . Seborrhea 11/13/2008    Past Surgical History  Procedure Laterality Date  . Vasectomy  1976    History   Social History  . Marital Status: Married    Spouse Name: N/A  . Number of Children: N/A  . Years of Education: N/A   Occupational History  .      Works for [employer]   Social History Main Topics  . Smoking status: Former Smoker    Quit date: 12/25/1985  . Smokeless tobacco: Not on file  . Alcohol Use: Yes     Comment: 6/week  . Drug Use: Not on file  . Sexual Activity: Not on file   Other Topics Concern  . Not on file   Social History Narrative    Current Outpatient Prescriptions on File Prior to Visit  Medication Sig Dispense Refill  . aspirin 81 MG tablet Take 81 mg by mouth daily.      Marland Kitchen. glucose blood (RELION GLUCOSE TEST STRIPS) test strip Use as instructed     . loratadine-pseudoephedrine (LORATADINE-D 24HR) 10-240 MG per 24 hr tablet TAKE ONE TABLET BY MOUTH ONCE DAILY AS  NEEDED 90 tablet 0  . losartan-hydrochlorothiazide (HYZAAR) 100-25 MG per tablet TAKE ONE-HALF TABLET BY MOUTH ONCE DAILY 45 tablet 0  . sildenafil (VIAGRA) 100 MG tablet Take 1 tablet (100 mg total) by mouth daily as needed. 30 tablet 11  . simvastatin (ZOCOR) 40 MG tablet Take 1 tablet (40 mg total) by mouth every evening. 30 tablet 0  . tamsulosin (FLOMAX) 0.4 MG CAPS capsule TAKE ONE CAPSULE BY MOUTH ONCE DAILY 90 capsule 0  . fluticasone (FLONASE) 50 MCG/ACT nasal spray Place 2 sprays into the nose daily. 16 g 11   No current facility-administered medications on file prior to visit.    Allergies  Allergen Reactions  . Metformin   . Rosuvastatin   . Sulfonamide Derivatives     Family History  Problem Relation Age of Onset  . Heart disease Father   . Cancer Maternal Grandmother   . Stroke Maternal Grandfather   . Cancer Paternal Grandfather     BP 132/72 mmHg  Pulse 80  Temp(Src) 98.2 F (36.8 C) (Oral)  Ht 5\' 5"  (1.651 m)  Wt 209 lb (94.802 kg)  BMI 34.78 kg/m2  SpO2 92%  Review of Systems Denies LOC.    Objective:   Physical Exam VITAL SIGNS:  See vs page GENERAL: no distress Pulses: dorsalis pedis intact bilat.   MSK: no deformity of the  feet CV: no leg edema Skin:  no ulcer on the feet.  normal color and temp on the feet. Neuro: sensation is intact to touch on the feet.   Ext: There is bilateral onychomycosis of the toenails.    A1c=6.5% i personally reviewed electrocardiogram tracing: normal    Assessment & Plan:  DM: overcontrolled: given this regimen, which does match insulin to her changing needs throughout the day.  Please take the 70/30 insulin, 45 units, twice a day, with the first and last meals of the day.     Subjective:   Patient here for Medicare initial wellness visit and management of other chronic and acute problems.     Risk factors: advanced age   Fall risk: low  Roster of Physicians Providing Medical Care to Patient:  See  "snapshot" Past Medical History  Diagnosis Date  . DIABETES MELLITUS, TYPE II 08/10/2007  . HYPERLIPIDEMIA 08/10/2007  . ERECTILE DYSFUNCTION 11/13/2008  . 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 HYPERTENSION 08/10/2007  . UNSPECIFIED PERIPHERAL VASCULAR DISEASE 11/13/2008  . ALLERGIC RHINITIS 08/10/2007  . BLADDER OUTLET OBSTRUCTION 11/13/2008  . PERS HX NONCOMPLIANCE W/MED TX PRS HAZARDS HLTH 11/13/2008  . Seborrhea 11/13/2008    Past Surgical History  Procedure Laterality Date  . Vasectomy  1976    History   Social History  . Marital Status: Married    Spouse Name: N/A  . Number of Children: N/A  . Years of Education: N/A   Occupational History  .      Works for [employer]   Social History Main Topics  . Smoking status: Former Smoker    Quit date: 12/25/1985  . Smokeless tobacco: Not on file  . Alcohol Use: Yes     Comment: 6/week  . Drug Use: Not on file  . Sexual Activity: Not on file   Other Topics Concern  . Not on file   Social History Narrative    Current Outpatient Prescriptions on File Prior to Visit  Medication Sig Dispense Refill  . aspirin 81 MG tablet Take 81 mg by mouth daily.      Marland Kitchen glucose blood (RELION GLUCOSE TEST STRIPS) test strip Use as instructed     . loratadine-pseudoephedrine (LORATADINE-D 24HR) 10-240 MG per 24 hr tablet TAKE ONE TABLET BY MOUTH ONCE DAILY AS NEEDED 90 tablet 0  . losartan-hydrochlorothiazide (HYZAAR) 100-25 MG per tablet TAKE ONE-HALF TABLET BY MOUTH ONCE DAILY 45 tablet 0  . sildenafil (VIAGRA) 100 MG tablet Take 1 tablet (100 mg total) by mouth daily as needed. 30 tablet 11  . simvastatin (ZOCOR) 40 MG tablet Take 1 tablet (40 mg total) by mouth every evening. 30 tablet 0  . tamsulosin (FLOMAX) 0.4 MG CAPS capsule TAKE ONE CAPSULE BY MOUTH ONCE DAILY 90 capsule 0  . traMADol (ULTRAM) 50 MG tablet Take 1 tablet (50 mg total) by mouth every 8 (eight) hours as needed. 50 tablet 5  . fluticasone  (FLONASE) 50 MCG/ACT nasal spray Place 2 sprays into the nose daily. 16 g 11   No current facility-administered medications on file prior to visit.    Allergies  Allergen Reactions  . Metformin   . Rosuvastatin   . Sulfonamide Derivatives     Family History  Problem Relation Age of Onset  . Heart disease Father   . Cancer Maternal Grandmother   . Stroke Maternal Grandfather   . Cancer Paternal Grandfather     BP 132/72 mmHg  Pulse 80  Temp(Src)  98.2 F (36.8 C) (Oral)  Ht  (1.651 m)  Wt 209 lb (94.802 kg)  BMI 34.78 kg/m2  SpO2 92% No illicit drug use.  EtOH is rare. Activities of Daily Living: In your present state of health, do you have any difficulty performing the following activities?:  Preparing food and eating?: No  Bathing yourself: No  Getting dressed: No  Using the toilet: No  Moving around from place to place: No  In the past year have you fallen or had a near fall?: No    Home Safety: Has smoke detector and wears seat belts. Firearms are safely stored. No excess sun exposure.  Diet and Exercise  Current exercise habits: pt says poor Dietary issues discussed: pt reports a healthy diet   Depression Screen  Q1: Over the past two weeks, have you felt down, depressed or hopeless? no  Q2: Over the past two weeks, have you felt little interest or pleasure in doing things? no   The following portions of the patient's history were reviewed and updated as appropriate: allergies, current medications, past family history, past medical history, past social history, past surgical history and problem list.   Review of Systems  No change in chronic hearing loss; no visual loss Objective:   Vision:  Curator, but not recall name Hearing: grossly normal Body mass index:  See vs page Msk: pt easily and quickly performs "get-up-and-go" from a sitting position Cognitive Impairment Assessment: cognition, memory and judgment appear normal.  remembers 2/3  at 5 minutes (? effort).  excellent recall.  can easily read and write a sentence.  alert and oriented x 3.    Assessment:   Medicare wellness utd on preventive parameters.   Pt is given a printout of medicare benefits.  we discussed code status.  pt requests full code, but would not want to be started or maintained on artificial life-support measures if there was not a reasonable chance of recovery.   Plan:   During the course of the visit the patient was educated and counseled about appropriate screening and preventive services including:        Fall prevention    Diabetes screening  Nutrition counseling   Vaccines / LABS Vaccines are declined PSA  Patient Instructions (the written plan) was given to the patient.

## 2015-07-09 NOTE — Telephone Encounter (Signed)
Antonio Patterson, Please see below, AGAIN this is your patient

## 2015-07-09 NOTE — Telephone Encounter (Signed)
[facility] called in  Regards to his claritin please call pharmacy [phone]

## 2015-07-09 NOTE — Patient Instructions (Addendum)
Please take the 70/30 insulin, 45 units, twice a day, with the first and last meals of the day.   Please come back for a follow-up appointment in 3 months blood tests are requested for you today.  We'll let you know about the results. Please let me know if you decide to get the colonoscopy.  It reduces your chances of getting cancer. check your blood sugar twice a day.  vary the time of day when you check, between before the 3 meals, and at bedtime.  also check if you have symptoms of your blood sugar being too high or too low.  please keep a record of the readings and bring it to your next appointment here.  You can write it on any piece of paper.  please call us sooner if your blood sugar goes below 70, or if you have a lot of readings over 200.  please consider these measures for your health:  minimize alcohol.  do not use tobacco products.  have a colonoscopy at least every 10 years from age 550.  keep firearms safely stored.  always use seat belts.  have working smoke alarms in your home.  see an eye doctor and dentist regularly.  never drive under the influence of alcohol or drugs (including prescription drugs).  those with fair skin should take precautions against the sun. good diet and exercise significantly improve the control of your diabetes.  please let me know if you wish to be referred to a dietician.  high blood sugar is very risky to your health.  you should see an eye doctor and dentist every year.  It is very important to get all recommended vaccinations.  Also, please let me know if you want to see a hearing specialist.

## 2015-07-10 ENCOUNTER — Other Ambulatory Visit: Payer: Self-pay | Admitting: Endocrinology

## 2015-07-25 ENCOUNTER — Other Ambulatory Visit: Payer: Self-pay | Admitting: Endocrinology

## 2015-09-12 ENCOUNTER — Other Ambulatory Visit: Payer: Self-pay | Admitting: Endocrinology

## 2015-10-05 ENCOUNTER — Other Ambulatory Visit: Payer: Self-pay | Admitting: Endocrinology

## 2015-10-07 ENCOUNTER — Other Ambulatory Visit: Payer: Self-pay | Admitting: Endocrinology

## 2015-10-15 ENCOUNTER — Ambulatory Visit: Payer: No Typology Code available for payment source | Admitting: Endocrinology

## 2015-10-22 ENCOUNTER — Ambulatory Visit (INDEPENDENT_AMBULATORY_CARE_PROVIDER_SITE_OTHER): Payer: Medicare Other | Admitting: Endocrinology

## 2015-10-22 ENCOUNTER — Ambulatory Visit: Payer: No Typology Code available for payment source | Admitting: Endocrinology

## 2015-10-22 ENCOUNTER — Encounter: Payer: Self-pay | Admitting: Endocrinology

## 2015-10-22 VITALS — BP 136/80 | HR 83 | Temp 98.4°F | Ht 65.0 in | Wt 215.0 lb

## 2015-10-22 DIAGNOSIS — E119 Type 2 diabetes mellitus without complications: Secondary | ICD-10-CM | POA: Diagnosis not present

## 2015-10-22 LAB — POCT GLYCOSYLATED HEMOGLOBIN (HGB A1C): HEMOGLOBIN A1C: 6.8

## 2015-10-22 MED ORDER — INSULIN NPH ISOPHANE & REGULAR (70-30) 100 UNIT/ML ~~LOC~~ SUSP: 50.0000 [IU] | Freq: Three times a day (TID) | SUBCUTANEOUS | Status: DC

## 2015-10-22 NOTE — Patient Instructions (Addendum)
Please take the 70/30 insulin, 50 units, twice a day, with the first and last meals of the day.   This is not a good type of insulin to take as needed, as it is slow-release.  Please come back for a follow-up appointment in 3 months.   check your blood sugar twice a day.  vary the time of day when you check, between before the 3 meals, and at bedtime.  also check if you have symptoms of your blood sugar being too high or too low.  please keep a record of the readings and bring it to your next appointment here.  You can write it on any piece of paper.  please call us sooner if your blood sugar goes below 70, or if you have a lot of readings over 200.

## 2015-10-22 NOTE — Progress Notes (Signed)
Subjective:    Patient ID: Antonio Patterson, male    DOB: 1951/10/31, 64 y.o.   MRN: [id number]  HPI Pt returns for f/u of diabetes mellitus: DM type: Insulin-requiring type 2 Dx'ed: 1997 Complications: PAD Therapy: insulin since 2009 DKA: never Severe hypoglycemia: never Pancreatitis: never Other: he has chosen a bid insulin regimen.  Interval history: no cbg record, but states cbg's vary from 90-200's.  He takes 40 units bid, and extra insulin (20-30 units) at hs (usually the highest cbg of the day), depending on his cbg.   Past Medical History  Diagnosis Date  . DIABETES MELLITUS, TYPE II 08/10/2007  . HYPERLIPIDEMIA 08/10/2007  . ERECTILE DYSFUNCTION 11/13/2008  . 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 HYPERTENSION 08/10/2007  . UNSPECIFIED PERIPHERAL VASCULAR DISEASE 11/13/2008  . ALLERGIC RHINITIS 08/10/2007  . BLADDER OUTLET OBSTRUCTION 11/13/2008  . PERS HX NONCOMPLIANCE W/MED TX PRS HAZARDS HLTH 11/13/2008  . Seborrhea 11/13/2008    Past Surgical History  Procedure Laterality Date  . Vasectomy  1976    Social History   Social History  . Marital Status: Married    Spouse Name: N/A  . Number of Children: N/A  . Years of Education: N/A   Occupational History  .      Works for [employer]   Social History Main Topics  . Smoking status: Former Smoker    Quit date: 12/25/1985  . Smokeless tobacco: Not on file  . Alcohol Use: Yes     Comment: 6/week  . Drug Use: Not on file  . Sexual Activity: Not on file   Other Topics Concern  . Not on file   Social History Narrative    Current Outpatient Prescriptions on File Prior to Visit  Medication Sig Dispense Refill  . aspirin 81 MG tablet Take 81 mg by mouth daily.      Marland Kitchen glucose blood (RELION GLUCOSE TEST STRIPS) test strip Use as instructed     . loratadine-pseudoephedrine (LORATADINE-D 24HR) 10-240 MG per 24 hr tablet TAKE ONE TABLET BY MOUTH ONCE DAILY AS NEEDED 90 tablet 0  .  losartan-hydrochlorothiazide (HYZAAR) 100-25 MG tablet TAKE ONE-HALF TABLET BY MOUTH ONCE DAILY 45 tablet 0  . losartan-hydrochlorothiazide (HYZAAR) 100-25 MG tablet TAKE ONE-HALF TABLET BY MOUTH ONCE DAILY 45 tablet 1  . simvastatin (ZOCOR) 40 MG tablet TAKE ONE TABLET BY MOUTH IN THE EVENING 30 tablet 0  . tamsulosin (FLOMAX) 0.4 MG CAPS capsule TAKE ONE CAPSULE BY MOUTH ONCE DAILY 90 capsule 0  . traMADol (ULTRAM) 50 MG tablet Take 1 tablet (50 mg total) by mouth every 8 (eight) hours as needed. 50 tablet 5  . fluticasone (FLONASE) 50 MCG/ACT nasal spray Place 2 sprays into the nose daily. 16 g 11  . sildenafil (VIAGRA) 100 MG tablet Take 1 tablet (100 mg total) by mouth daily as needed. (Patient not taking: Reported on 10/22/2015) 30 tablet 11   No current facility-administered medications on file prior to visit.    Allergies  Allergen Reactions  . Metformin   . Rosuvastatin   . Sulfonamide Derivatives     Family History  Problem Relation Age of Onset  . Heart disease Father   . Cancer Maternal Grandmother   . Stroke Maternal Grandfather   . Cancer Paternal Grandfather     BP 136/80 mmHg  Pulse 83  Temp(Src) 98.4 F (36.9 C) (Oral)  Ht  (1.651 m)  Wt 215 lb (97.523 kg)  BMI 35.78 kg/m2  SpO2 95%  Review of Systems He denies hypoglycemia.      Objective:   Physical Exam VITAL SIGNS:  See vs page GENERAL: no distress Pulses: dorsalis pedis intact bilat.   MSK: no deformity of the feet CV: no leg edema Skin:  no ulcer on the feet.  normal color and temp on the feet. Neuro: sensation is intact to touch on the feet.     A1c=6.8%    Assessment & Plan:  DM: overcontrolled, given this regimen, which does match insulin to his changing needs throughout the day  Patient is advised the following: Patient Instructions  Please take the 70/30 insulin, 50 units, twice a day, with the first and last meals of the day.   This is not a good type of insulin to take as  needed, as it is slow-release.  Please come back for a follow-up appointment in 3 months.   check your blood sugar twice a day.  vary the time of day when you check, between before the 3 meals, and at bedtime.  also check if you have symptoms of your blood sugar being too high or too low.  please keep a record of the readings and bring it to your next appointment here.  You can write it on any piece of paper.  please call us sooner if your blood sugar goes below 70, or if you have a lot of readings over 200.

## 2015-10-26 ENCOUNTER — Other Ambulatory Visit: Payer: Self-pay

## 2015-10-26 MED ORDER — SIMVASTATIN 40 MG PO TABS: 40.0000 mg | ORAL_TABLET | Freq: Every evening | ORAL | Status: DC

## 2015-10-29 DIAGNOSIS — M1711 Unilateral primary osteoarthritis, right knee: Secondary | ICD-10-CM | POA: Diagnosis not present

## 2015-10-29 DIAGNOSIS — M11261 Other chondrocalcinosis, right knee: Secondary | ICD-10-CM | POA: Diagnosis not present

## 2015-12-11 ENCOUNTER — Other Ambulatory Visit: Payer: Self-pay | Admitting: Endocrinology

## 2015-12-26 HISTORY — PX: TURBINATE REDUCTION: SHX6157

## 2016-01-21 ENCOUNTER — Encounter: Payer: Self-pay | Admitting: Endocrinology

## 2016-01-21 ENCOUNTER — Ambulatory Visit (INDEPENDENT_AMBULATORY_CARE_PROVIDER_SITE_OTHER): Payer: Medicare Other | Admitting: Endocrinology

## 2016-01-21 VITALS — BP 132/84 | HR 84 | Temp 98.0°F | Ht 67.0 in | Wt 217.0 lb

## 2016-01-21 DIAGNOSIS — R400 Somnolence: Secondary | ICD-10-CM | POA: Diagnosis not present

## 2016-01-21 DIAGNOSIS — R4702 Dysphasia: Secondary | ICD-10-CM | POA: Diagnosis not present

## 2016-01-21 DIAGNOSIS — E119 Type 2 diabetes mellitus without complications: Secondary | ICD-10-CM

## 2016-01-21 LAB — POCT GLYCOSYLATED HEMOGLOBIN (HGB A1C): HEMOGLOBIN A1C: 7.1

## 2016-01-21 NOTE — Progress Notes (Signed)
Subjective:    Patient ID: Antonio Patterson, male    DOB: [birth year], 65 y.o.   MRN: [id number]  HPI Pt returns for f/u of diabetes mellitus: DM type: Insulin-requiring type 2 Dx'ed: 1997 Complications: PAD Therapy: insulin since 2009 DKA: never Severe hypoglycemia: never Pancreatitis: never Other: he declines multiple daily injections of reg insulin; he takes human insulin, due to cost.   Interval history: no cbg record, but states cbg's are well-controlled.  There is no trend throughout the day. Past Medical History  Diagnosis Date  . DIABETES MELLITUS, TYPE II 08/10/2007  . HYPERLIPIDEMIA 08/10/2007  . ERECTILE DYSFUNCTION 11/13/2008  . 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 HYPERTENSION 08/10/2007  . UNSPECIFIED PERIPHERAL VASCULAR DISEASE 11/13/2008  . ALLERGIC RHINITIS 08/10/2007  . BLADDER OUTLET OBSTRUCTION 11/13/2008  . PERS HX NONCOMPLIANCE W/MED TX PRS HAZARDS HLTH 11/13/2008  . Seborrhea 11/13/2008    Past Surgical History  Procedure Laterality Date  . Vasectomy  1976    Social History   Social History  . Marital Status: Married    Spouse Name: N/A  . Number of Children: N/A  . Years of Education: N/A   Occupational History  .      Works for [employer]   Social History Main Topics  . Smoking status: Former Smoker    Quit date: 12/25/1985  . Smokeless tobacco: Not on file  . Alcohol Use: Yes     Comment: 6/week  . Drug Use: Not on file  . Sexual Activity: Not on file   Other Topics Concern  . Not on file   Social History Narrative    Current Outpatient Prescriptions on File Prior to Visit  Medication Sig Dispense Refill  . aspirin 81 MG tablet Take 81 mg by mouth daily.      Marland Kitchen glucose blood (RELION GLUCOSE TEST STRIPS) test strip Use as instructed     . insulin NPH-regular Human (NOVOLIN 70/30) (70-30) 100 UNIT/ML injection Inject 50 Units into the skin 3 (three) times daily. 40 mL 11  . loratadine-pseudoephedrine  (LORATADINE-D 24HR) 10-240 MG per 24 hr tablet TAKE ONE TABLET BY MOUTH ONCE DAILY AS NEEDED 90 tablet 0  . losartan-hydrochlorothiazide (HYZAAR) 100-25 MG tablet TAKE ONE-HALF TABLET BY MOUTH ONCE DAILY 45 tablet 0  . simvastatin (ZOCOR) 40 MG tablet Take 1 tablet (40 mg total) by mouth every evening. 30 tablet 6  . tamsulosin (FLOMAX) 0.4 MG CAPS capsule TAKE ONE CAPSULE BY MOUTH ONCE DAILY 90 capsule 0  . traMADol (ULTRAM) 50 MG tablet Take 1 tablet (50 mg total) by mouth every 8 (eight) hours as needed. 50 tablet 5  . fluticasone (FLONASE) 50 MCG/ACT nasal spray Place 2 sprays into the nose daily. 16 g 11   No current facility-administered medications on file prior to visit.    Allergies  Allergen Reactions  . Metformin   . Rosuvastatin   . Sulfonamide Derivatives     Family History  Problem Relation Age of Onset  . Heart disease Father   . Cancer Maternal Grandmother   . Stroke Maternal Grandfather   . Cancer Paternal Grandfather    BP 132/84 mmHg  Pulse 84  Temp(Src) 98 F (36.7 C) (Oral)  Ht  (1.702 m)  Wt 217 lb (98.431 kg)  BMI 33.98 kg/m2  SpO2 92%  Review of Systems He denies hypoglycemia. He has intermittent liquid-only dysphagia.  He has intermittent daytime somnolence, but he says he sleeps well.  Objective:   Physical Exam VITAL SIGNS:  See vs page GENERAL: no distress Pulses: dorsalis pedis intact bilat.   MSK: no deformity of the feet CV: 1+ bilat leg edema Skin:  no ulcer on the feet.  normal color and temp on the feet. Neuro: sensation is intact to touch on the feet Ext: There is bilateral onychomycosis of the toenails.   A1c=7.1%    Assessment & Plan:  DM: this is the best control this pt should aim for, given this regimen, which does match insulin to his changing needs throughout the day.   Liquid-only dysphagia, new.  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  Daytime drowsiness, new.   Patient is advised the following: Patient  Instructions  Please continue the same insulin. Please come back for a follow-up appointment in 3 months check your blood sugar twice a day.  vary the time of day when you check, between before the 3 meals, and at bedtime.  also check if you have symptoms of your blood sugar being too high or too low.  please keep a record of the readings and bring it to your next appointment here (or you can bring the meter itself).  You can write it on any piece of paper.  please call us sooner if your blood sugar goes below 70, or if you have a lot of readings over 200.  To avoid liquids going down the wrong way, try drinking thicker liquids whenever possible, and drinking more slowly.  Let me know if you want to see a sleep specialist.

## 2016-01-21 NOTE — Patient Instructions (Addendum)
Please continue the same insulin. Please come back for a follow-up appointment in 3 months check your blood sugar twice a day.  vary the time of day when you check, between before the 3 meals, and at bedtime.  also check if you have symptoms of your blood sugar being too high or too low.  please keep a record of the readings and bring it to your next appointment here (or you can bring the meter itself).  You can write it on any piece of paper.  please call us sooner if your blood sugar goes below 70, or if you have a lot of readings over 200.  To avoid liquids going down the wrong way, try drinking thicker liquids whenever possible, and drinking more slowly.  Let me know if you want to see a sleep specialist.

## 2016-02-01 ENCOUNTER — Other Ambulatory Visit: Payer: Self-pay | Admitting: Endocrinology

## 2016-02-02 ENCOUNTER — Telehealth: Payer: Self-pay

## 2016-02-02 NOTE — Telephone Encounter (Signed)
Rx submitted via fax.  

## 2016-02-02 NOTE — Telephone Encounter (Signed)
Pt is requesting a refill on tramadol 50 mg. Please advise if ok to refill. Thanks!

## 2016-03-13 ENCOUNTER — Telehealth: Payer: Self-pay | Admitting: Endocrinology

## 2016-03-13 MED ORDER — TAMSULOSIN HCL 0.4 MG PO CAPS: 0.4000 mg | ORAL_CAPSULE | Freq: Every day | ORAL | Status: DC

## 2016-03-13 NOTE — Telephone Encounter (Signed)
Rx submitted per pt's request.  

## 2016-03-13 NOTE — Telephone Encounter (Signed)
flomax 90 day supply needs to be called into [facility] in [place]

## 2016-03-23 ENCOUNTER — Telehealth: Payer: Self-pay

## 2016-03-23 NOTE — Telephone Encounter (Signed)
Spoke with patient and he advised that he declines flu shot and never gets them

## 2016-05-26 ENCOUNTER — Other Ambulatory Visit: Payer: Self-pay | Admitting: Endocrinology

## 2016-06-01 ENCOUNTER — Other Ambulatory Visit: Payer: Self-pay | Admitting: Endocrinology

## 2016-06-02 ENCOUNTER — Other Ambulatory Visit: Payer: Self-pay | Admitting: Endocrinology

## 2016-06-05 ENCOUNTER — Other Ambulatory Visit: Payer: Self-pay

## 2016-06-05 MED ORDER — SIMVASTATIN 40 MG PO TABS: 40.0000 mg | ORAL_TABLET | Freq: Every evening | ORAL | Status: DC

## 2016-07-04 ENCOUNTER — Other Ambulatory Visit: Payer: Self-pay | Admitting: Internal Medicine

## 2016-08-11 ENCOUNTER — Other Ambulatory Visit: Payer: Self-pay | Admitting: Endocrinology

## 2016-08-11 ENCOUNTER — Ambulatory Visit (INDEPENDENT_AMBULATORY_CARE_PROVIDER_SITE_OTHER): Payer: Medicare Other | Admitting: Endocrinology

## 2016-08-11 ENCOUNTER — Encounter: Payer: Self-pay | Admitting: Endocrinology

## 2016-08-11 VITALS — BP 132/70 | HR 76 | Ht 67.0 in | Wt 211.0 lb

## 2016-08-11 DIAGNOSIS — E785 Hyperlipidemia, unspecified: Secondary | ICD-10-CM | POA: Diagnosis not present

## 2016-08-11 DIAGNOSIS — Z794 Long term (current) use of insulin: Secondary | ICD-10-CM | POA: Diagnosis not present

## 2016-08-11 DIAGNOSIS — I10 Essential (primary) hypertension: Secondary | ICD-10-CM | POA: Diagnosis not present

## 2016-08-11 DIAGNOSIS — Z125 Encounter for screening for malignant neoplasm of prostate: Secondary | ICD-10-CM | POA: Diagnosis not present

## 2016-08-11 DIAGNOSIS — E119 Type 2 diabetes mellitus without complications: Secondary | ICD-10-CM

## 2016-08-11 DIAGNOSIS — Z0000 Encounter for general adult medical examination without abnormal findings: Secondary | ICD-10-CM

## 2016-08-11 DIAGNOSIS — Z23 Encounter for immunization: Secondary | ICD-10-CM | POA: Diagnosis not present

## 2016-08-11 LAB — BASIC METABOLIC PANEL
BUN: 16 mg/dL (ref 6–23)
CALCIUM: 9.8 mg/dL (ref 8.4–10.5)
CO2: 30 mEq/L (ref 19–32)
Chloride: 102 mEq/L (ref 96–112)
Creatinine, Ser: 1.01 mg/dL (ref 0.40–1.50)
GFR: 78.51 mL/min (ref >60.00)
Glucose, Bld: 141 mg/dL — ABNORMAL HIGH (ref 70–99)
POTASSIUM: 4.4 meq/L (ref 3.5–5.1)
SODIUM: 137 meq/L (ref 135–145)

## 2016-08-11 LAB — URINALYSIS, ROUTINE W REFLEX MICROSCOPIC
Bilirubin Urine: NEGATIVE
HGB URINE DIPSTICK: NEGATIVE
Leukocytes, UA: NEGATIVE
NITRITE: NEGATIVE
RBC / HPF: NONE SEEN (ref 0–?)
Specific Gravity, Urine: 1.015 (ref 1.000–1.030)
TOTAL PROTEIN, URINE-UPE24: NEGATIVE
URINE GLUCOSE: NEGATIVE
UROBILINOGEN UA: 1 (ref 0.0–1.0)
pH: 6 (ref 5.0–8.0)

## 2016-08-11 LAB — CBC WITH DIFFERENTIAL/PLATELET
BASOS ABS: 0.1 10*3/uL (ref 0.0–0.1)
Basophils Relative: 0.6 % (ref 0.0–3.0)
EOS ABS: 0.4 10*3/uL (ref 0.0–0.7)
Eosinophils Relative: 3.6 % (ref 0.0–5.0)
HCT: 48.4 % (ref 39.0–52.0)
HEMOGLOBIN: 16.1 g/dL (ref 13.0–17.0)
Lymphocytes Relative: 30.1 % (ref 12.0–46.0)
Lymphs Abs: 3.1 10*3/uL (ref 0.7–4.0)
MCHC: 33.4 g/dL (ref 30.0–36.0)
MCV: 86.5 fl (ref 78.0–100.0)
MONO ABS: 0.8 10*3/uL (ref 0.1–1.0)
Monocytes Relative: 7.7 % (ref 3.0–12.0)
Neutro Abs: 5.9 10*3/uL (ref 1.4–7.7)
Neutrophils Relative %: 58 % (ref 43.0–77.0)
Platelets: 253 10*3/uL (ref 150.0–400.0)
RBC: 5.59 Mil/uL (ref 4.22–5.81)
RDW: 14.2 % (ref 11.5–15.5)
WBC: 10.2 10*3/uL (ref 4.0–10.5)

## 2016-08-11 LAB — POCT GLYCOSYLATED HEMOGLOBIN (HGB A1C): HEMOGLOBIN A1C: 6.9

## 2016-08-11 LAB — LIPID PANEL
CHOLESTEROL: 137 mg/dL (ref 0–200)
HDL: 48.4 mg/dL (ref >39.00)
LDL CALC: 79 mg/dL (ref 0–99)
NONHDL: 88.62
Total CHOL/HDL Ratio: 3
Triglycerides: 46 mg/dL (ref 0.0–149.0)
VLDL: 9.2 mg/dL (ref 0.0–40.0)

## 2016-08-11 LAB — HEPATIC FUNCTION PANEL
ALBUMIN: 4.5 g/dL (ref 3.5–5.2)
ALT: 37 U/L (ref 0–53)
AST: 26 U/L (ref 0–37)
Alkaline Phosphatase: 67 U/L (ref 39–117)
Bilirubin, Direct: 0.3 mg/dL (ref 0.0–0.3)
TOTAL PROTEIN: 7 g/dL (ref 6.0–8.3)
Total Bilirubin: 1 mg/dL (ref 0.2–1.2)

## 2016-08-11 LAB — MICROALBUMIN / CREATININE URINE RATIO
CREATININE, U: 108.4 mg/dL
Microalb Creat Ratio: 0.6 mg/g (ref 0.0–30.0)

## 2016-08-11 LAB — PSA, MEDICARE: PSA: 1.85 ng/mL (ref 0.10–4.00)

## 2016-08-11 LAB — TSH: TSH: 2.12 u[IU]/mL (ref 0.35–4.50)

## 2016-08-11 MED ORDER — INSULIN NPH (HUMAN) (ISOPHANE) 100 UNIT/ML ~~LOC~~ SUSP: 30.0000 [IU] | Freq: Every day | SUBCUTANEOUS | 11 refills | Status: DC

## 2016-08-11 MED ORDER — INSULIN REGULAR HUMAN 100 UNIT/ML IJ SOLN: 60.0000 [IU] | Freq: Two times a day (BID) | INTRAMUSCULAR | 11 refills | Status: DC

## 2016-08-11 NOTE — Progress Notes (Signed)
Subjective:     Patient ID: Antonio Patterson, male   DOB: 01-03-1951, 65 y.o.   MRN: [id number]  HPI   Review of Systems     Objective:   Physical Exam     Assessment:         Plan:

## 2016-08-11 NOTE — Progress Notes (Signed)
we discussed code status.  pt requests full code, but would not want to be started or maintained on artificial life-support measures if there was not a reasonable chance of recovery 

## 2016-08-11 NOTE — Telephone Encounter (Signed)
please call patient: I need to know what this is for. 

## 2016-08-11 NOTE — Patient Instructions (Addendum)
blood tests are requested for you today.  We'll let you know about the results. Please consider these measures for your health:  minimize alcohol.  Do not use tobacco products.  Have a colonoscopy at least every 10 years from age 950.  Keep firearms safely stored.  Always use seat belts.  have working smoke alarms in your home.  See an eye doctor and dentist regularly.  Never drive under the influence of alcohol or drugs (including prescription drugs).  Those with fair skin should take precautions against the sun, and should carefully examine their skin once per month, for any new or changed moles. It is critically important to prevent falling down (keep floor areas well-lit, dry, and free of loose objects.  If you have a cane, walker, or wheelchair, you should use it, even for short trips around the house.  Wear flat-soled shoes.  Also, try not to rush).   Please come back for a follow-up appointment in 6 months.

## 2016-08-11 NOTE — Progress Notes (Signed)
Subjective:    Patient ID: Antonio Patterson, male    DOB: 07-25-1951, 65 y.o.   MRN: [id number]  HPI HTN: denies sob Dyslipidemia: denies chest pain Pt returns for f/u of diabetes mellitus: DM type: Insulin-requiring type 2 Dx'ed: 1997 Complications: PAD Therapy: insulin since 2009 DKA: never Severe hypoglycemia: never Pancreatitis: never.  Other: he declines multiple daily injections of reg insulin; he takes human insulin, due to cost.   Interval history: no cbg record, but states cbg's are well-controlled.  There is no trend throughout the day.  He denies hypoglycemia Past Medical History:  Diagnosis Date  . ALLERGIC RHINITIS 08/10/2007  . BLADDER OUTLET OBSTRUCTION 11/13/2008  . DIABETES MELLITUS, TYPE II 08/10/2007  . ERECTILE DYSFUNCTION 11/13/2008  . HYPERLIPIDEMIA 08/10/2007  . HYPERTENSION 08/10/2007  . PERS HX NONCOMPLIANCE W/MED TX PRS HAZARDS HLTH 11/13/2008  . Seborrhea 11/13/2008  . 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 UNSPECIFIED PERIPHERAL VASCULAR DISEASE 11/13/2008    Past Surgical History:  Procedure Laterality Date  . VASECTOMY  1976    Social History   Social History  . Marital status: Married    Spouse name: N/A  . Number of children: N/A  . Years of education: N/A   Occupational History  .  [employer]    Works for [employer]   Social History Main Topics  . Smoking status: Former Smoker    Quit date: 12/25/1985  . Smokeless tobacco: Not on file  . Alcohol use Yes     Comment: 6/week  . Drug use: Unknown  . Sexual activity: Not on file   Other Topics Concern  . Not on file   Social History Narrative  . No narrative on file    Current Outpatient Prescriptions on File Prior to Visit  Medication Sig Dispense Refill  . aspirin 81 MG tablet Take 81 mg by mouth daily.      Marland Kitchen. glucose blood (RELION GLUCOSE TEST STRIPS) test strip Use as instructed     . loratadine-pseudoephedrine (LORATADINE-D 24HR) 10-240 MG per 24 hr  tablet TAKE ONE TABLET BY MOUTH ONCE DAILY AS NEEDED 90 tablet 0  . losartan-hydrochlorothiazide (HYZAAR) 100-25 MG tablet TAKE ONE-HALF TABLET BY MOUTH ONCE DAILY 45 tablet 0  . simvastatin (ZOCOR) 40 MG tablet TAKE ONE TABLET BY MOUTH ONCE DAILY IN THE EVENING 30 tablet 0  . tamsulosin (FLOMAX) 0.4 MG CAPS capsule Take 1 capsule (0.4 mg total) by mouth daily. 90 capsule 2  . traMADol (ULTRAM) 50 MG tablet TAKE ONE TABLET BY MOUTH EVERY 8 HOURS AS NEEDED 50 tablet 0  . fluticasone (FLONASE) 50 MCG/ACT nasal spray Place 2 sprays into the nose daily. 16 g 11   No current facility-administered medications on file prior to visit.     Allergies  Allergen Reactions  . Metformin   . Rosuvastatin   . Sulfonamide Derivatives     Family History  Problem Relation Age of Onset  . Heart disease Father   . Cancer Maternal Grandmother   . Stroke Maternal Grandfather   . Cancer Paternal Grandfather     BP 132/70   Pulse 76   Ht 5\' 7"  (1.702 m)   Wt 211 lb (95.7 kg)   SpO2 91%   BMI 33.05 kg/m     Review of Systems He denies weight change and decreased urination    Objective:   Physical Exam VS: see vs page GEN: no distress HEAD: head: no deformity eyes: no periorbital swelling,  no proptosis external nose and ears are normal mouth: no lesion seen NECK: supple, thyroid is not enlarged CHEST WALL: no deformity LUNGS: clear to auscultation BREASTS:  No gynecomastia CV: reg rate and rhythm, no murmur.  ABDOMEN: abdomen is soft, nontender.  no hepatosplenomegaly.  not distended.  no hernia.   RECTAL: normal external and internal exam.  heme neg.  PROSTATE:  Normal size.  No nodule.   MUSCULOSKELETAL: muscle bulk and strength are grossly normal.  no obvious joint swelling.  gait is normal and steady EXTEMITIES: no deformity.  no ulcer on the feet.  feet are of normal color and temp.  1+ bilat leg edema.  There is bilateral onychomycosis of the toenails.   PULSES: dorsalis pedis  intact bilat.  no carotid bruit NEURO:  cn 2-12 grossly intact.   readily moves all 4's.  sensation is intact to touch on the feet.   SKIN:  Normal texture and temperature.  No rash or suspicious lesion is visible.   NODES:  None palpable at the neck PSYCH: alert, well-oriented.  Does not appear anxious nor depressed.    A1c=6.9% i personally reviewed electrocardiogram tracing (today): Indication: DM Impression: normal  Lab Results  Component Value Date   WBC 10.2 08/11/2016   HGB 16.1 08/11/2016   HCT 48.4 08/11/2016   PLT 253.0 08/11/2016   GLUCOSE 141 (H) 08/11/2016   CHOL 137 08/11/2016   TRIG 46.0 08/11/2016   HDL 48.40 08/11/2016   LDLCALC 79 08/11/2016   ALT 37 08/11/2016   AST 26 08/11/2016   NA 137 08/11/2016   K 4.4 08/11/2016   CL 102 08/11/2016   CREATININE 1.01 08/11/2016   BUN 16 08/11/2016   CO2 30 08/11/2016   TSH 2.12 08/11/2016   PSA 1.85 08/11/2016   HGBA1C 6.9 08/11/2016   MICROALBUR <0.7 08/11/2016      Assessment & Plan:  Insulin-requiring type 2 DM: well-controlled HTN: well-controlled Dyslipidemia: well-controlled Prostatism: well-controlled  Please continue the same medications

## 2016-08-12 LAB — HEPATITIS C ANTIBODY: HCV AB: NEGATIVE

## 2016-08-14 ENCOUNTER — Telehealth: Payer: Self-pay | Admitting: Endocrinology

## 2016-08-14 NOTE — Telephone Encounter (Signed)
Pharmacy is calling needing to verify the insulin script.  They weren't sure about the dosing, 30 units at bedtime and 10 units daily. Please call back.

## 2016-08-15 ENCOUNTER — Other Ambulatory Visit: Payer: Self-pay | Admitting: Endocrinology

## 2016-08-15 MED ORDER — INSULIN NPH (HUMAN) (ISOPHANE) 100 UNIT/ML ~~LOC~~ SUSP: 30.0000 [IU] | Freq: Every day | SUBCUTANEOUS | 11 refills | Status: DC

## 2016-08-15 NOTE — Telephone Encounter (Signed)
please call patient: I need to know what pain this is for

## 2016-08-15 NOTE — Telephone Encounter (Signed)
Should be 30 units qhs. I have corrected and resent

## 2016-08-15 NOTE — Telephone Encounter (Signed)
Noted pharmacy advised.

## 2016-08-15 NOTE — Telephone Encounter (Signed)
Pharmacy called wanting to verify the novolin R prescription. Should the dosage be 30 units at bed time or 10 units daily? Both directions are listed in the medication signature. Thanks!

## 2016-08-18 NOTE — Telephone Encounter (Signed)
please call patient: We don't have any documentation of arthritis.   What part of your body is painful. Let us know, and we'll check an x-ray.

## 2016-08-18 NOTE — Telephone Encounter (Signed)
I contacted the pt and he stated he is taking this medication for arthritis.

## 2016-08-23 ENCOUNTER — Telehealth: Payer: Self-pay | Admitting: Endocrinology

## 2016-08-23 DIAGNOSIS — M79643 Pain in unspecified hand: Secondary | ICD-10-CM

## 2016-08-23 NOTE — Telephone Encounter (Signed)
PT called [facility] [place]

## 2016-08-23 NOTE — Telephone Encounter (Signed)
It can be refilled only if it is supported by a diagnosis--which would be from an abnormal x-ray or seeing a specialist.

## 2016-08-23 NOTE — Telephone Encounter (Signed)
I contacted the patient and he stated he has been taking this medication for years for the pain he experiences in his hands. Patient wanted to know if we could refill this for him. Patient stated we have been doing so for the past couple of years. Patient stated he likes taking this medication because it helps with the pain in his hands and its a cheap medication he can afford.

## 2016-08-24 DIAGNOSIS — M79643 Pain in unspecified hand: Secondary | ICD-10-CM | POA: Insufficient documentation

## 2016-08-24 NOTE — Telephone Encounter (Signed)
I contacted the patient and he agreed to having a x-ray done. Patient requested order to be placed.  Thanks!

## 2016-08-24 NOTE — Telephone Encounter (Signed)
Ok, I ordered 

## 2016-08-25 ENCOUNTER — Other Ambulatory Visit: Payer: Self-pay | Admitting: Endocrinology

## 2016-08-25 ENCOUNTER — Ambulatory Visit
Admission: RE | Admit: 2016-08-25 | Discharge: 2016-08-25 | Disposition: A | Discharge status: Home / Self Care | Payer: Medicare Other | Source: Ambulatory Visit | Attending: Endocrinology | Admitting: Endocrinology

## 2016-08-25 DIAGNOSIS — M79643 Pain in unspecified hand: Secondary | ICD-10-CM

## 2016-08-25 DIAGNOSIS — M19042 Primary osteoarthritis, left hand: Secondary | ICD-10-CM | POA: Diagnosis not present

## 2016-08-25 MED ORDER — TRAMADOL HCL 50 MG PO TABS: 50.0000 mg | ORAL_TABLET | Freq: Three times a day (TID) | ORAL | 4 refills | Status: DC | PRN

## 2016-08-25 NOTE — Telephone Encounter (Signed)
I contacted the patient and advised order have been placed.

## 2016-08-31 ENCOUNTER — Telehealth: Payer: Self-pay | Admitting: Endocrinology

## 2016-08-31 NOTE — Telephone Encounter (Signed)
PT called and said the pharmacy called him and said that they are needing more information on Dr. Everardo AllEllison before they can refill his Tramadol script.  PT wasn't sure what information.

## 2016-09-01 NOTE — Telephone Encounter (Signed)
I contacted the patient and left a vm advising I have contacted the pharmacy and got his medication refilled at [facility].

## 2016-09-04 ENCOUNTER — Other Ambulatory Visit: Payer: Self-pay | Admitting: Endocrinology

## 2016-09-25 ENCOUNTER — Other Ambulatory Visit: Payer: Self-pay | Admitting: Endocrinology

## 2016-12-04 ENCOUNTER — Other Ambulatory Visit: Payer: Self-pay | Admitting: Endocrinology

## 2016-12-04 ENCOUNTER — Telehealth: Payer: Self-pay | Admitting: Endocrinology

## 2016-12-04 NOTE — Telephone Encounter (Signed)
Please add on 12/08/16, 07:45

## 2016-12-04 NOTE — Telephone Encounter (Signed)
I contacted the patient and advised of message. Patient voiced understanding and agreed to coming in on 12/08/2016. Antonio CornfieldStephanie notified to add the patient on.

## 2016-12-04 NOTE — Telephone Encounter (Signed)
Pt called in [facility], he has a question about an appointment.

## 2016-12-04 NOTE — Telephone Encounter (Signed)
I contacted the patient. Patient stated his blood pressure has been running between 140-160 for the top number and 80-90 for the bottom number. Patient also stated he has been having migranes at night. Patient wanted to know if he should come in for blood pressure check or if he needed to come in for a office visit.  I offered a office visit for tomorrow at 830, but the patient stated the only day that would work for him to come in for a office viist would be Friday and we do not have any openings.  Please advise, Thanks

## 2016-12-08 ENCOUNTER — Telehealth: Payer: Self-pay | Admitting: Endocrinology

## 2016-12-08 ENCOUNTER — Ambulatory Visit (INDEPENDENT_AMBULATORY_CARE_PROVIDER_SITE_OTHER): Payer: Medicare Other | Admitting: Endocrinology

## 2016-12-08 ENCOUNTER — Encounter: Payer: Self-pay | Admitting: Endocrinology

## 2016-12-08 ENCOUNTER — Other Ambulatory Visit: Payer: Self-pay

## 2016-12-08 VITALS — BP 154/86 | HR 93 | Ht 67.0 in | Wt 213.0 lb

## 2016-12-08 DIAGNOSIS — R059 Cough, unspecified: Secondary | ICD-10-CM | POA: Insufficient documentation

## 2016-12-08 DIAGNOSIS — Z794 Long term (current) use of insulin: Secondary | ICD-10-CM

## 2016-12-08 DIAGNOSIS — E119 Type 2 diabetes mellitus without complications: Secondary | ICD-10-CM

## 2016-12-08 DIAGNOSIS — R05 Cough: Secondary | ICD-10-CM | POA: Diagnosis not present

## 2016-12-08 LAB — POCT GLYCOSYLATED HEMOGLOBIN (HGB A1C): Hemoglobin A1C: 6.8

## 2016-12-08 MED ORDER — LOSARTAN POTASSIUM-HCTZ 100-12.5 MG PO TABS: 1.0000 | ORAL_TABLET | Freq: Every day | ORAL | 3 refills | Status: DC

## 2016-12-08 MED ORDER — LORATADINE-PSEUDOEPHEDRINE ER 10-240 MG PO TB24: 1.0000 | ORAL_TABLET | Freq: Every day | ORAL | 2 refills | Status: DC

## 2016-12-08 NOTE — Progress Notes (Signed)
Subjective:    Patient ID: Antonio Patterson, male    DOB: 04/21/1951, 65 y.o.   MRN: [id number]  HPI Pt returns for f/u of diabetes mellitus: DM type: Insulin-requiring type 2 Dx'ed: 1997 Complications: PAD Therapy: insulin since 2009 DKA: never Severe hypoglycemia: never Pancreatitis: never.  Other: he takes multiple daily injections; he takes human insulin, due to cost.   Interval history: no cbg record, but states cbg's are well-controlled.  There is no trend throughout the day.  pt states he feels well in general.   bp was normal at last ov, but pt says higher recently. Past Medical History:  Diagnosis Date  . ALLERGIC RHINITIS 08/10/2007  . BLADDER OUTLET OBSTRUCTION 11/13/2008  . DIABETES MELLITUS, TYPE II 08/10/2007  . ERECTILE DYSFUNCTION 11/13/2008  . HYPERLIPIDEMIA 08/10/2007  . HYPERTENSION 08/10/2007  . PERS HX NONCOMPLIANCE W/MED TX PRS HAZARDS HLTH 11/13/2008  . Seborrhea 11/13/2008  . 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 UNSPECIFIED PERIPHERAL VASCULAR DISEASE 11/13/2008    Past Surgical History:  Procedure Laterality Date  . VASECTOMY  1976    Social History   Social History  . Marital status: Married    Spouse name: N/A  . Number of children: N/A  . Years of education: N/A   Occupational History  .  [employer]    Works for [employer]   Social History Main Topics  . Smoking status: Former Smoker    Quit date: 12/25/1985  . Smokeless tobacco: Not on file  . Alcohol use Yes     Comment: 6/week  . Drug use: Unknown  . Sexual activity: Not on file   Other Topics Concern  . Not on file   Social History Narrative  . No narrative on file    Current Outpatient Prescriptions on File Prior to Visit  Medication Sig Dispense Refill  . aspirin 81 MG tablet Take 81 mg by mouth daily.      Marland Kitchen. glucose blood (RELION GLUCOSE TEST STRIPS) test strip Use as instructed     . insulin NPH Human (HUMULIN N,NOVOLIN N) 100 UNIT/ML injection  Inject 0.3 mLs (30 Units total) into the skin at bedtime. 10 mL 11  . insulin regular (NOVOLIN R,HUMULIN R) 100 units/mL injection Inject 0.6 mLs (60 Units total) into the skin 2 (two) times daily before a meal. And syringes 2/day 40 mL 11  . loratadine-pseudoephedrine (LORATADINE-D 24HR) 10-240 MG per 24 hr tablet TAKE ONE TABLET BY MOUTH ONCE DAILY AS NEEDED 90 tablet 0  . simvastatin (ZOCOR) 40 MG tablet TAKE ONE TABLET BY MOUTH ONCE DAILY IN THE EVENING 30 tablet 0  . tamsulosin (FLOMAX) 0.4 MG CAPS capsule TAKE ONE CAPSULE BY MOUTH ONCE DAILY 90 capsule 2  . traMADol (ULTRAM) 50 MG tablet Take 1 tablet (50 mg total) by mouth every 8 (eight) hours as needed. 50 tablet 4  . fluticasone (FLONASE) 50 MCG/ACT nasal spray Place 2 sprays into the nose daily. 16 g 11   No current facility-administered medications on file prior to visit.     Allergies  Allergen Reactions  . Metformin   . Rosuvastatin   . Sulfonamide Derivatives     Family History  Problem Relation Age of Onset  . Heart disease Father   . Cancer Maternal Grandmother   . Stroke Maternal Grandfather   . Cancer Paternal Grandfather     BP (!) 154/86   Pulse 93   Ht 5\' 7"  (1.702 m)   Wt  213 lb (96.6 kg)   SpO2 96%   BMI 33.36 kg/m   Review of Systems He denies hypoglycemia.  He has slight sensation in the throat, and assoc cough    Objective:   Physical Exam VITAL SIGNS:  See vs page GENERAL: no distress Pulses: dorsalis pedis intact bilat.   MSK: no deformity of the feet CV: 1+ bilat leg edema Skin:  no ulcer on the feet.  normal color and temp on the feet. Neuro: sensation is intact to touch on the feet Ext: There is bilateral onychomycosis of the toenails  A1c=6.8%    Assessment & Plan:  HTN: worse Insulin-requiring type 2 DM, with PAD: well-controlled. Patient is advised the following: Patient Instructions  Please continue the same insulins. I have sent a prescription to your pharmacy, to increase  the losartan-HCTZ check your blood sugar twice a day.  vary the time of day when you check, between before the 3 meals, and at bedtime.  also check if you have symptoms of your blood sugar being too high or too low.  please keep a record of the readings and bring it to your next appointment here (or you can bring the meter itself).  You can write it on any piece of paper.  please call us sooner if your blood sugar goes below 70, or if you have a lot of readings over 200.  Please see an ENT specialist.  you will receive a phone call, about a day and time for an appointment Please come back for a follow-up appointment in 5 months.

## 2016-12-08 NOTE — Patient Instructions (Addendum)
Please continue the same insulins. I have sent a prescription to your pharmacy, to increase the losartan-HCTZ check your blood sugar twice a day.  vary the time of day when you check, between before the 3 meals, and at bedtime.  also check if you have symptoms of your blood sugar being too high or too low.  please keep a record of the readings and bring it to your next appointment here (or you can bring the meter itself).  You can write it on any piece of paper.  please call us sooner if your blood sugar goes below 70, or if you have a lot of readings over 200.  Please see an ENT specialist.  you will receive a phone call, about a day and time for an appointment Please come back for a follow-up appointment in 5 months.

## 2016-12-11 IMAGING — CR DG HAND COMPLETE 3+V*L*
3 series · 3 of 3 positions shown · non-contrast
Comparison: None in PACs

CLINICAL DATA: Left middle and ring finger pain radiating into the
metacarpal region with no known injury

EXAM:
LEFT HAND - COMPLETE 3+ VIEW

[view not recorded (1 of 3)]
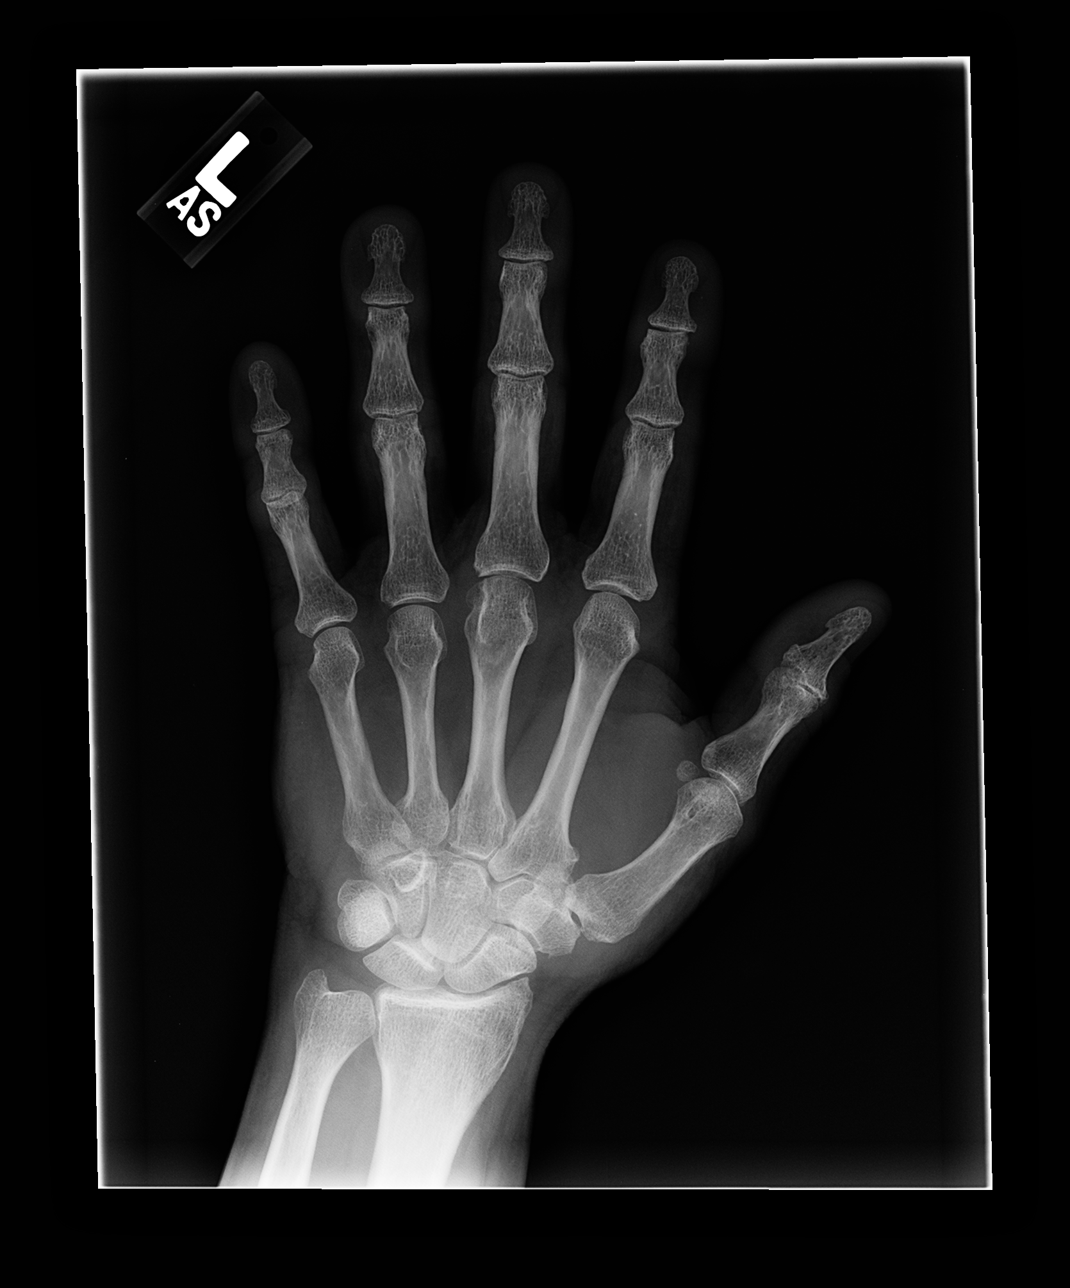

[view not recorded (2 of 3)]
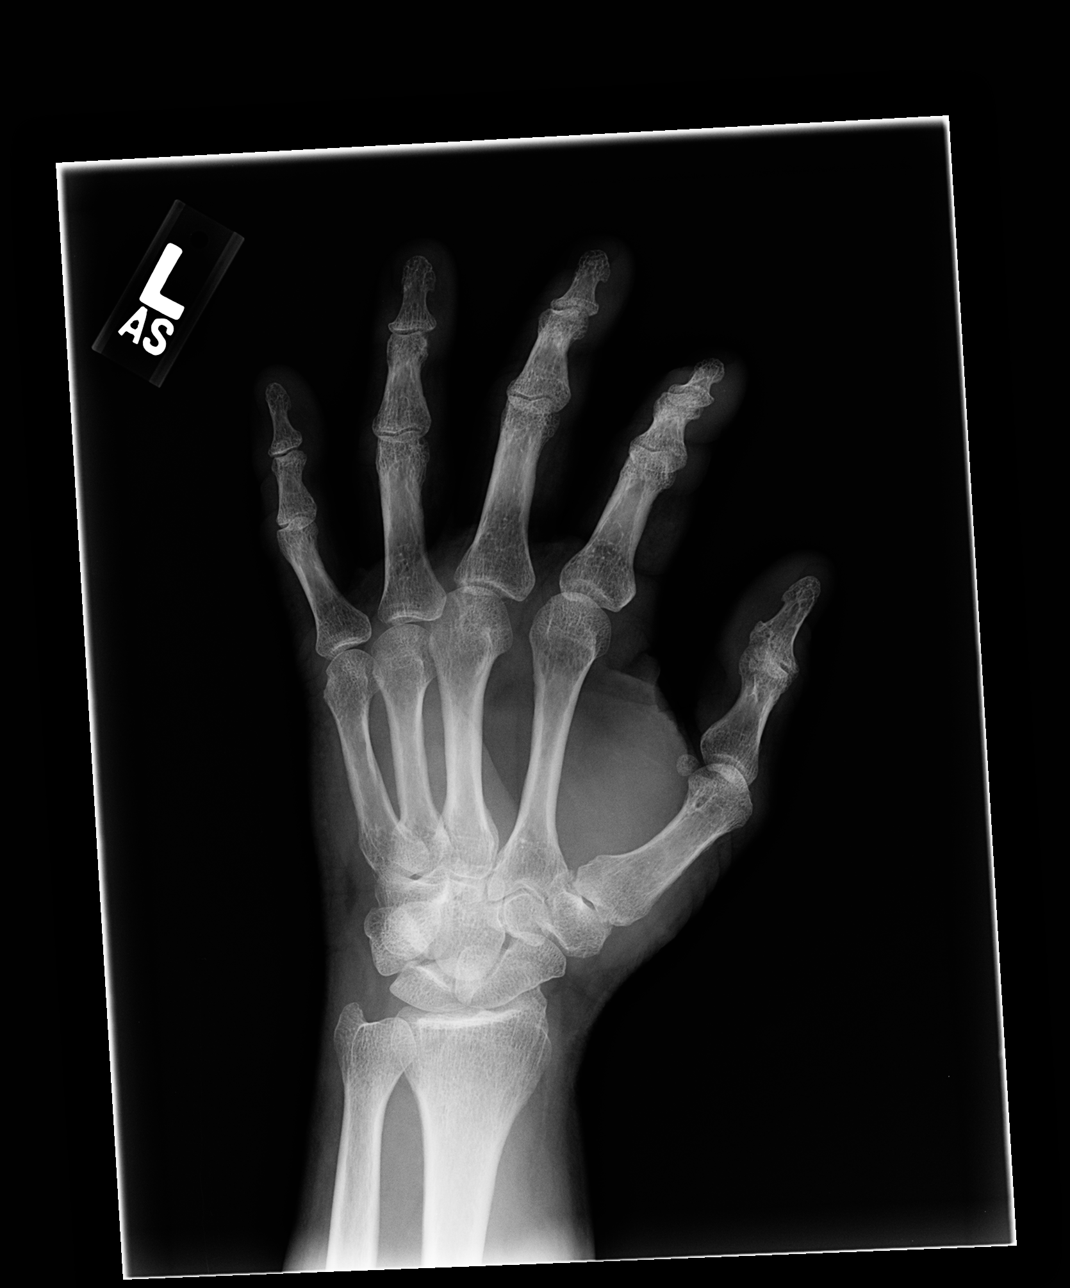

[view not recorded (3 of 3)]
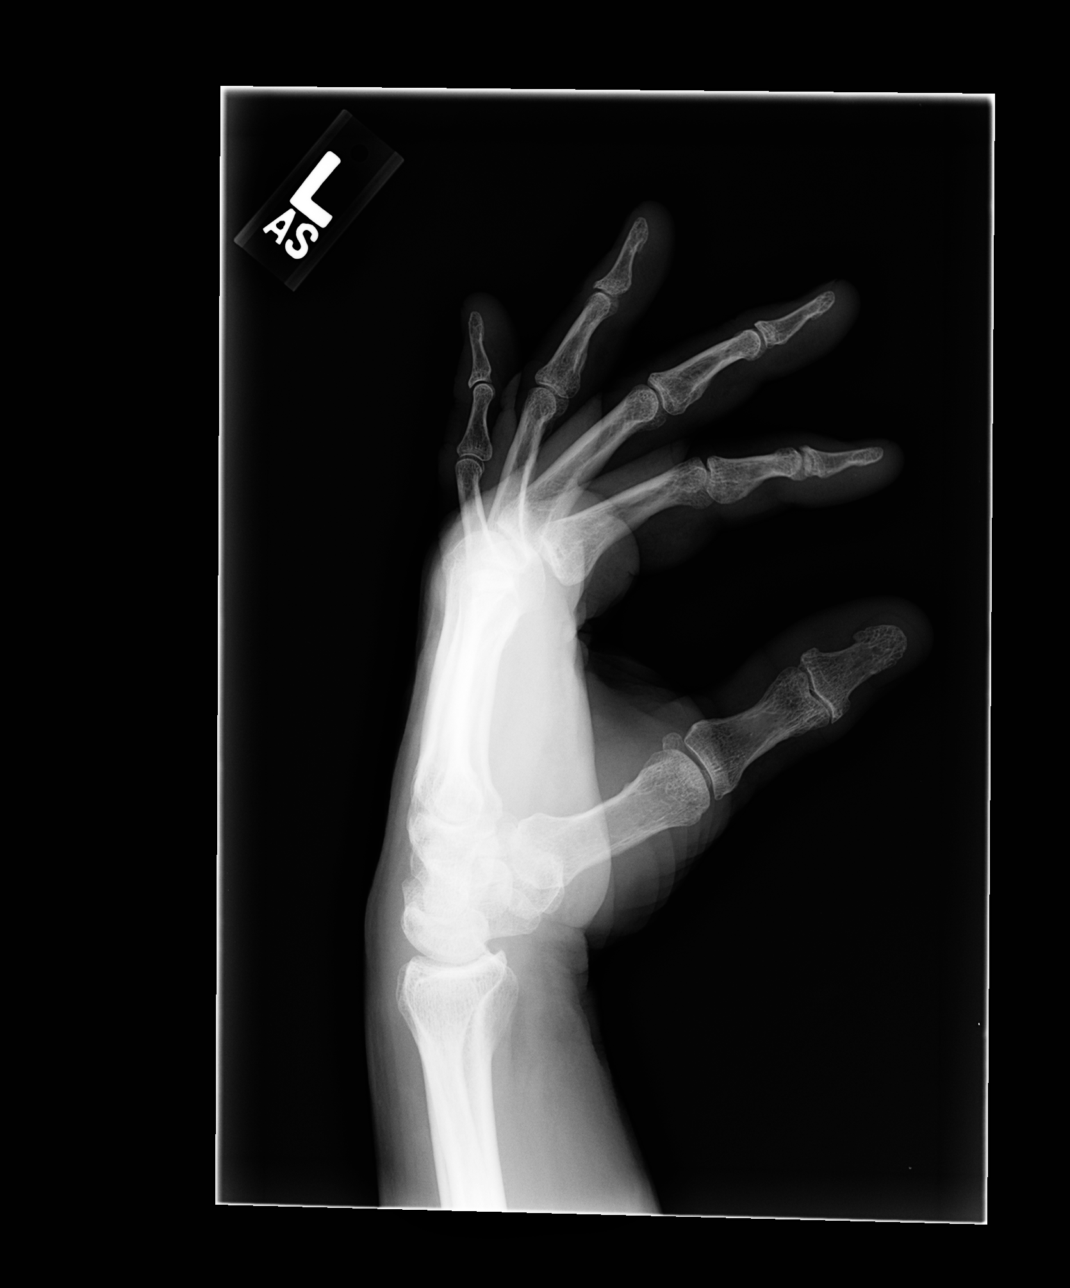

[3 of 3 positions shown; findings below may reference images not displayed]

FINDINGS: The bones are subjectively osteopenic. There is no lytic or blastic
lesion or evidence of a fracture. There is mild symmetric narrowing
of the interphalangeal and metacarpophalangeal joints. No erosive
changes are observed there is mild degenerative change of the first
CMC joint. The soft tissues exhibit no acute abnormalities.
IMPRESSION: Mild osteoarthritic changes of the interphalangeal and
metacarpophalangeal and first CMC joints. No acute bony abnormality.

## 2017-01-01 ENCOUNTER — Other Ambulatory Visit: Payer: Self-pay

## 2017-01-01 MED ORDER — SIMVASTATIN 40 MG PO TABS: ORAL_TABLET | ORAL | 2 refills | Status: DC

## 2017-01-05 DIAGNOSIS — T496X5A Adverse effect of otorhinolaryngological drugs and preparations, initial encounter: Secondary | ICD-10-CM | POA: Diagnosis not present

## 2017-01-05 DIAGNOSIS — R09A2 Foreign body sensation, throat: Secondary | ICD-10-CM | POA: Insufficient documentation

## 2017-01-05 DIAGNOSIS — J342 Deviated nasal septum: Secondary | ICD-10-CM | POA: Diagnosis not present

## 2017-01-05 DIAGNOSIS — Z87891 Personal history of nicotine dependence: Secondary | ICD-10-CM | POA: Diagnosis not present

## 2017-01-05 DIAGNOSIS — R05 Cough: Secondary | ICD-10-CM | POA: Diagnosis not present

## 2017-01-05 DIAGNOSIS — K219 Gastro-esophageal reflux disease without esophagitis: Secondary | ICD-10-CM | POA: Diagnosis not present

## 2017-01-05 DIAGNOSIS — R0981 Nasal congestion: Secondary | ICD-10-CM | POA: Diagnosis not present

## 2017-01-05 DIAGNOSIS — R0989 Other specified symptoms and signs involving the circulatory and respiratory systems: Secondary | ICD-10-CM | POA: Insufficient documentation

## 2017-01-05 DIAGNOSIS — K449 Diaphragmatic hernia without obstruction or gangrene: Secondary | ICD-10-CM | POA: Diagnosis not present

## 2017-01-05 DIAGNOSIS — J343 Hypertrophy of nasal turbinates: Secondary | ICD-10-CM | POA: Diagnosis not present

## 2017-02-09 ENCOUNTER — Ambulatory Visit: Payer: No Typology Code available for payment source | Admitting: Endocrinology

## 2017-03-06 ENCOUNTER — Telehealth: Payer: Self-pay | Admitting: Endocrinology

## 2017-03-06 MED ORDER — TRAMADOL HCL 50 MG PO TABS: 50.0000 mg | ORAL_TABLET | Freq: Three times a day (TID) | ORAL | 4 refills | Status: DC | PRN

## 2017-03-06 NOTE — Telephone Encounter (Signed)
please call patient: I need to know what pain the tramadol is for.

## 2017-03-06 NOTE — Telephone Encounter (Signed)
Rx submitted

## 2017-03-06 NOTE — Addendum Note (Signed)
Addended by: Romero BellingELLISON, Jacey Pelc on: 03/06/2017 01:40 PM   Modules accepted: Orders

## 2017-03-06 NOTE — Telephone Encounter (Signed)
I contacted the patient and he stated he takes the medication for the pain in his hands.

## 2017-03-06 NOTE — Telephone Encounter (Signed)
I printed  

## 2017-04-04 ENCOUNTER — Other Ambulatory Visit: Payer: Self-pay | Admitting: Endocrinology

## 2017-04-06 DIAGNOSIS — J343 Hypertrophy of nasal turbinates: Secondary | ICD-10-CM | POA: Diagnosis not present

## 2017-04-06 DIAGNOSIS — J3489 Other specified disorders of nose and nasal sinuses: Secondary | ICD-10-CM | POA: Diagnosis not present

## 2017-04-06 DIAGNOSIS — J310 Chronic rhinitis: Secondary | ICD-10-CM | POA: Diagnosis not present

## 2017-04-06 DIAGNOSIS — J324 Chronic pansinusitis: Secondary | ICD-10-CM | POA: Insufficient documentation

## 2017-04-06 DIAGNOSIS — J342 Deviated nasal septum: Secondary | ICD-10-CM | POA: Diagnosis not present

## 2017-04-26 DIAGNOSIS — J322 Chronic ethmoidal sinusitis: Secondary | ICD-10-CM | POA: Diagnosis not present

## 2017-04-26 DIAGNOSIS — J343 Hypertrophy of nasal turbinates: Secondary | ICD-10-CM | POA: Diagnosis not present

## 2017-04-26 DIAGNOSIS — J320 Chronic maxillary sinusitis: Secondary | ICD-10-CM | POA: Diagnosis not present

## 2017-04-26 DIAGNOSIS — J321 Chronic frontal sinusitis: Secondary | ICD-10-CM | POA: Diagnosis not present

## 2017-04-26 DIAGNOSIS — J329 Chronic sinusitis, unspecified: Secondary | ICD-10-CM | POA: Diagnosis not present

## 2017-05-11 ENCOUNTER — Ambulatory Visit: Payer: Medicare Other | Admitting: Endocrinology

## 2017-07-13 NOTE — Telephone Encounter (Signed)
error 

## 2017-09-03 ENCOUNTER — Other Ambulatory Visit: Payer: Self-pay | Admitting: Endocrinology

## 2017-09-26 DIAGNOSIS — H1089 Other conjunctivitis: Secondary | ICD-10-CM | POA: Diagnosis not present

## 2017-12-03 ENCOUNTER — Other Ambulatory Visit: Payer: Self-pay | Admitting: Endocrinology

## 2017-12-03 NOTE — Telephone Encounter (Signed)
Please refill x 3 mos Ov is due 

## 2017-12-05 ENCOUNTER — Other Ambulatory Visit: Payer: Self-pay

## 2017-12-05 MED ORDER — LOSARTAN POTASSIUM-HCTZ 100-12.5 MG PO TABS: 1.0000 | ORAL_TABLET | Freq: Every day | ORAL | 3 refills | Status: DC

## 2017-12-06 ENCOUNTER — Other Ambulatory Visit: Payer: Self-pay | Admitting: Endocrinology

## 2017-12-10 ENCOUNTER — Other Ambulatory Visit: Payer: Self-pay | Admitting: Endocrinology

## 2018-01-07 ENCOUNTER — Other Ambulatory Visit: Payer: Self-pay | Admitting: Endocrinology

## 2018-02-18 ENCOUNTER — Other Ambulatory Visit: Payer: Self-pay | Admitting: Endocrinology

## 2018-02-22 ENCOUNTER — Other Ambulatory Visit: Payer: Self-pay | Admitting: Endocrinology

## 2018-02-22 ENCOUNTER — Encounter: Payer: Self-pay | Admitting: Endocrinology

## 2018-02-22 ENCOUNTER — Ambulatory Visit (INDEPENDENT_AMBULATORY_CARE_PROVIDER_SITE_OTHER): Payer: Medicare Other | Admitting: Endocrinology

## 2018-02-22 VITALS — BP 140/74 | HR 71 | Temp 98.5°F | Ht 67.0 in | Wt 208.8 lb

## 2018-02-22 DIAGNOSIS — R059 Cough, unspecified: Secondary | ICD-10-CM

## 2018-02-22 DIAGNOSIS — J209 Acute bronchitis, unspecified: Secondary | ICD-10-CM

## 2018-02-22 DIAGNOSIS — Z23 Encounter for immunization: Secondary | ICD-10-CM

## 2018-02-22 DIAGNOSIS — Z125 Encounter for screening for malignant neoplasm of prostate: Secondary | ICD-10-CM

## 2018-02-22 DIAGNOSIS — Z794 Long term (current) use of insulin: Secondary | ICD-10-CM

## 2018-02-22 DIAGNOSIS — E119 Type 2 diabetes mellitus without complications: Secondary | ICD-10-CM | POA: Diagnosis not present

## 2018-02-22 DIAGNOSIS — Z0000 Encounter for general adult medical examination without abnormal findings: Secondary | ICD-10-CM | POA: Diagnosis not present

## 2018-02-22 DIAGNOSIS — R05 Cough: Secondary | ICD-10-CM

## 2018-02-22 LAB — URINALYSIS, ROUTINE W REFLEX MICROSCOPIC
Bilirubin Urine: NEGATIVE
HGB URINE DIPSTICK: NEGATIVE
Ketones, ur: NEGATIVE
LEUKOCYTES UA: NEGATIVE
NITRITE: NEGATIVE
RBC / HPF: NONE SEEN (ref 0–?)
Specific Gravity, Urine: 1.02 (ref 1.000–1.030)
Total Protein, Urine: NEGATIVE
URINE GLUCOSE: NEGATIVE
Urobilinogen, UA: 0.2 (ref 0.0–1.0)
pH: 6 (ref 5.0–8.0)

## 2018-02-22 LAB — MICROALBUMIN / CREATININE URINE RATIO
CREATININE, U: 110 mg/dL
MICROALB/CREAT RATIO: 0.6 mg/g (ref 0.0–30.0)

## 2018-02-22 LAB — HEPATIC FUNCTION PANEL
ALT: 18 U/L (ref 0–53)
AST: 18 U/L (ref 0–37)
Albumin: 3.8 g/dL (ref 3.5–5.2)
Alkaline Phosphatase: 64 U/L (ref 39–117)
BILIRUBIN TOTAL: 0.8 mg/dL (ref 0.2–1.2)
Bilirubin, Direct: 0.2 mg/dL (ref 0.0–0.3)
Total Protein: 7 g/dL (ref 6.0–8.3)

## 2018-02-22 LAB — LIPID PANEL
CHOL/HDL RATIO: 3
Cholesterol: 114 mg/dL (ref 0–200)
HDL: 42.7 mg/dL (ref >39.00)
LDL CALC: 64 mg/dL (ref 0–99)
NonHDL: 71.36
TRIGLYCERIDES: 38 mg/dL (ref 0.0–149.0)
VLDL: 7.6 mg/dL (ref 0.0–40.0)

## 2018-02-22 LAB — BASIC METABOLIC PANEL
BUN: 16 mg/dL (ref 6–23)
CALCIUM: 9.6 mg/dL (ref 8.4–10.5)
CO2: 29 mEq/L (ref 19–32)
Chloride: 103 mEq/L (ref 96–112)
Creatinine, Ser: 0.97 mg/dL (ref 0.40–1.50)
GFR: 81.88 mL/min (ref >60.00)
GLUCOSE: 113 mg/dL — AB (ref 70–99)
Potassium: 4.3 mEq/L (ref 3.5–5.1)
Sodium: 138 mEq/L (ref 135–145)

## 2018-02-22 LAB — CBC WITH DIFFERENTIAL/PLATELET
BASOS PCT: 1.2 % (ref 0.0–3.0)
Basophils Absolute: 0.1 10*3/uL (ref 0.0–0.1)
EOS ABS: 0.4 10*3/uL (ref 0.0–0.7)
EOS PCT: 5.4 % — AB (ref 0.0–5.0)
HEMATOCRIT: 45.7 % (ref 39.0–52.0)
Hemoglobin: 15.1 g/dL (ref 13.0–17.0)
LYMPHS PCT: 33.5 % (ref 12.0–46.0)
Lymphs Abs: 2.8 10*3/uL (ref 0.7–4.0)
MCHC: 33 g/dL (ref 30.0–36.0)
MCV: 88.8 fl (ref 78.0–100.0)
Monocytes Absolute: 0.7 10*3/uL (ref 0.1–1.0)
Monocytes Relative: 8.1 % (ref 3.0–12.0)
Neutro Abs: 4.3 10*3/uL (ref 1.4–7.7)
Neutrophils Relative %: 51.8 % (ref 43.0–77.0)
Platelets: 268 10*3/uL (ref 150.0–400.0)
RBC: 5.15 Mil/uL (ref 4.22–5.81)
RDW: 13.9 % (ref 11.5–15.5)
WBC: 8.3 10*3/uL (ref 4.0–10.5)

## 2018-02-22 LAB — PSA, MEDICARE: PSA: 2.06 ng/ml (ref 0.10–4.00)

## 2018-02-22 LAB — TSH: TSH: 1.31 u[IU]/mL (ref 0.35–4.50)

## 2018-02-22 LAB — POCT GLYCOSYLATED HEMOGLOBIN (HGB A1C): Hemoglobin A1C: 6.8

## 2018-02-22 MED ORDER — PROMETHAZINE-CODEINE 6.25-10 MG/5ML PO SYRP: 5.0000 mL | ORAL_SOLUTION | ORAL | 0 refills | Status: DC | PRN

## 2018-02-22 MED ORDER — AZITHROMYCIN 500 MG PO TABS: 500.0000 mg | ORAL_TABLET | Freq: Every day | ORAL | 0 refills | Status: DC

## 2018-02-22 NOTE — Patient Instructions (Addendum)
Please continue the same insulin. I have sent a prescription to your pharmacy, for antibiotic and cough syrup. blood tests are requested for you today.  We'll let you know about the results. I hope you feel better soon.  If you don't feel better in 2 weeks, please call back.  Please call sooner if you get worse. check your blood sugar twice a day.  vary the time of day when you check, between before the 3 meals, and at bedtime.  also check if you have symptoms of your blood sugar being too high or too low.  please keep a record of the readings and bring it to your next appointment here (or you can bring the meter itself).  You can write it on any piece of paper.  please call us sooner if your blood sugar goes below 70, or if you have a lot of readings over 200. Please consider these measures for your health:  minimize alcohol.  Do not use tobacco products.  Have a colonoscopy at least every 10 years from age 68.  Women should have an annual mammogram from age 68.  Keep firearms safely stored.  Always use seat belts.  have working smoke alarms in your home.  See an eye doctor and dentist regularly.  Never drive under the influence of alcohol or drugs (including prescription drugs).  Those with fair skin should take precautions against the sun, and should carefully examine their skin once per month, for any new or changed moles. please let me know what your wishes would be, if artificial life support measures should become necessary.  It is critically important to prevent falling down (keep floor areas well-lit, dry, and free of loose objects.  If you have a cane, walker, or wheelchair, you should use it, even for short trips around the house.  Wear flat-soled shoes.  Also, try not to rush).   Please come back for a follow-up appointment in 4-5 months.

## 2018-02-22 NOTE — Progress Notes (Signed)
Subjective:    Patient ID: Antonio Patterson, male    DOB: [birth year], 67 y.o.   MRN: [id number]  HPI  Pt returns for f/u of diabetes mellitus: DM type: Insulin-requiring type 2 Dx'ed: 1997 Complications: PAD Therapy: insulin since 2009 DKA: never Severe hypoglycemia: never Pancreatitis: never.  Other: he takes multiple daily injections; he takes human insulin, due to cost.   Interval history: no cbg record, but states cbg's are well-controlled.  He seldom has hypoglycemia, and these episodes are mild.  There is no trend throughout the day.  Pt states 1 week of slightly prod cough in the chest, and assoc nasal congestion.   Past Medical History:  Diagnosis Date  . ALLERGIC RHINITIS 08/10/2007  . BLADDER OUTLET OBSTRUCTION 11/13/2008  . DIABETES MELLITUS, TYPE II 08/10/2007  . ERECTILE DYSFUNCTION 11/13/2008  . HYPERLIPIDEMIA 08/10/2007  . HYPERTENSION 08/10/2007  . PERS HX NONCOMPLIANCE W/MED TX PRS HAZARDS HLTH 11/13/2008  . Seborrhea 11/13/2008  . 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 UNSPECIFIED PERIPHERAL VASCULAR DISEASE 11/13/2008    Past Surgical History:  Procedure Laterality Date  . VASECTOMY  1976    Social History   Socioeconomic History  . Marital status: Married    Spouse name: Not on file  . Number of children: Not on file  . Years of education: Not on file  . Highest education level: Not on file  Social Needs  . Financial resource strain: Not on file  . Food insecurity - worry: Not on file  . Food insecurity - inability: Not on file  . Transportation needs - medical: Not on file  . Transportation needs - non-medical: Not on file  Occupational History    Employer: [employer]    Comment: Works for [employer]  Tobacco Use  . Smoking status: Former Smoker    Last attempt to quit: 12/25/1985    Years since quitting: 32.1  Substance and Sexual Activity  . Alcohol use: Yes    Comment: 6/week  . Drug use: Not on file  . Sexual  activity: Not on file  Other Topics Concern  . Not on file  Social History Narrative  . Not on file    Current Outpatient Medications on File Prior to Visit  Medication Sig Dispense Refill  . aspirin 81 MG tablet Take 81 mg by mouth daily.      Marland Kitchen. glucose blood (RELION GLUCOSE TEST STRIPS) test strip Use as instructed     . insulin NPH Human (HUMULIN N,NOVOLIN N) 100 UNIT/ML injection Inject 0.3 mLs (30 Units total) into the skin at bedtime. 10 mL 11  . insulin regular (NOVOLIN R,HUMULIN R) 100 units/mL injection Inject 0.6 mLs (60 Units total) into the skin 2 (two) times daily before a meal. And syringes 2/day 40 mL 11  . loratadine-pseudoephedrine (LORATADINE-D 24HR) 10-240 MG 24 hr tablet Take 1 tablet by mouth daily. 90 tablet 2  . loratadine-pseudoephedrine (LORATADINE-D 24HR) 10-240 MG per 24 hr tablet TAKE ONE TABLET BY MOUTH ONCE DAILY AS NEEDED 90 tablet 0  . losartan-hydrochlorothiazide (HYZAAR) 100-12.5 MG tablet Take 1 tablet by mouth daily. 90 tablet 3  . losartan-hydrochlorothiazide (HYZAAR) 100-12.5 MG tablet TAKE ONE TABLET BY MOUTH ONCE DAILY 90 tablet 3  . omeprazole (PRILOSEC) 40 MG capsule Take 1 capsule by mouth daily.    . simvastatin (ZOCOR) 40 MG tablet TAKE ONE TABLET BY MOUTH ONCE DAILY IN THE EVENING 30 tablet 2  . simvastatin (ZOCOR) 40 MG tablet TAKE  1 TABLET BY MOUTH ONCE DAILY IN THE EVENING 90 tablet 2  . tamsulosin (FLOMAX) 0.4 MG CAPS capsule TAKE ONE CAPSULE BY MOUTH ONCE DAILY 90 capsule 2  . traMADol (ULTRAM) 50 MG tablet TAKE 1 TABLET BY MOUTH EVERY 8 HOURS AS NEEDED 50 tablet 4  . fluticasone (FLONASE) 50 MCG/ACT nasal spray Place 2 sprays into the nose daily. 16 g 11   No current facility-administered medications on file prior to visit.     Allergies  Allergen Reactions  . Metformin   . Rosuvastatin   . Sulfonamide Derivatives     Family History  Problem Relation Age of Onset  . Heart disease Father   . Cancer Maternal Grandmother   .  Stroke Maternal Grandfather   . Cancer Paternal Grandfather     BP 140/74 (BP Location: Left Arm, Patient Position: Sitting, Cuff Size: Large)   Pulse 71   Temp 98.5 F (36.9 C) (Oral)   Ht 5\' 7"  (1.702 m)   Wt 208 lb 12.8 oz (94.7 kg)   SpO2 96%   BMI 32.70 kg/m   Review of Systems Denies earache, sob, and fever.      Objective:   Physical Exam VITAL SIGNS:  See vs page GENERAL: no distress head: no deformity  eyes: no periorbital swelling, no proptosis  external nose and ears are normal  mouth: no lesion seen Both eac's and tm's are normal LUNGS:  Clear to auscultation Pulses: dorsalis pedis intact bilat.   MSK: no deformity of the feet.  CV: no leg edema.  Skin:  no ulcer on the feet.  normal color and temp on the feet.   Neuro: sensation is intact to touch on the feet.   Ext: There is bilateral onychomycosis of the toenails.    A1c=6.8%    Assessment & Plan:  Insulin-requiring type 2 DM: Acute bronchitis, new  Please continue the same insulin. I have sent a prescription to your pharmacy, for antibiotic and cough syrup.  Subjective:   Patient here for Medicare annual wellness visit and management of other chronic and acute problems.     Risk factors: advanced age    Roster of Physicians Providing Medical Care to Patient:  See "snapshot"   Activities of Daily Living: In your present state of health, do you have any difficulty performing the following activities (lives with wife)?:  Preparing food and eating?: No  Bathing yourself: No  Getting dressed: No  Using the toilet:No  Moving around from place to place: No  In the past year have you fallen or had a near fall?:No    Home Safety: Has smoke detector and wears seat belts. Firearms are safely stored. No excess sun exposure.  Opioid Use: none   Diet and Exercise  Current exercise habits: pt says not good Dietary issues discussed: pt reports a healthy diet   Depression Screen  Q1: Over the past  two weeks, have you felt down, depressed or hopeless? no  Q2: Over the past two weeks, have you felt little interest or pleasure in doing things? no   The following portions of the patient's history were reviewed and updated as appropriate: allergies, current medications, past family history, past medical history, past social history, past surgical history and problem list.   Review of Systems  No change in chronic hearing loss; no visual loss Objective:   Vision:  Curator, so he declines VA today.   Hearing: grossly normal Body mass index:  See vs  page Msk: pt easily and quickly performs "get-up-and-go" from a sitting position Cognitive Impairment Assessment: cognition, memory and judgment appear normal.  remembers 2/3 at 5 minutes (? effort).  excellent recall.  can easily read and write a sentence.  alert and oriented x 3.     Assessment:   Medicare wellness utd on preventive parameters    Plan:   During the course of the visit the patient was educated and counseled about appropriate screening and preventive services including:        Fall prevention is advised today   Diabetes screening  Nutrition counseling is offered  advanced directives/end of life addressed today:  see healthcare directives hyperlink  Vaccines are updated as needed  Patient Instructions (the written plan) was given to the patient.

## 2018-02-22 NOTE — Progress Notes (Signed)
we discussed code status.  pt requests full code, but would not want to be started or maintained on artificial life-support measures if there was not a reasonable chance of recovery 

## 2018-02-27 ENCOUNTER — Other Ambulatory Visit: Payer: Self-pay

## 2018-04-02 ENCOUNTER — Encounter: Payer: Self-pay | Admitting: Endocrinology

## 2018-04-02 ENCOUNTER — Ambulatory Visit: Payer: Medicare Other | Admitting: Endocrinology

## 2018-04-02 ENCOUNTER — Telehealth: Payer: Self-pay | Admitting: Endocrinology

## 2018-04-02 ENCOUNTER — Ambulatory Visit (INDEPENDENT_AMBULATORY_CARE_PROVIDER_SITE_OTHER): Payer: Medicare Other | Admitting: Endocrinology

## 2018-04-02 VITALS — BP 160/90 | HR 99 | Temp 100.0°F | Wt 206.2 lb

## 2018-04-02 DIAGNOSIS — Z794 Long term (current) use of insulin: Secondary | ICD-10-CM

## 2018-04-02 DIAGNOSIS — E119 Type 2 diabetes mellitus without complications: Secondary | ICD-10-CM

## 2018-04-02 MED ORDER — OMEPRAZOLE 40 MG PO CPDR: 40.0000 mg | DELAYED_RELEASE_CAPSULE | Freq: Every day | ORAL | 3 refills | Status: DC

## 2018-04-02 MED ORDER — ONDANSETRON HCL 4 MG PO TABS: 4.0000 mg | ORAL_TABLET | ORAL | 1 refills | Status: DC | PRN

## 2018-04-02 NOTE — Progress Notes (Signed)
Subjective:    Patient ID: Antonio Patterson, male    DOB: 26-Jul-1951, 67 y.o.   MRN: [id number]  HPI 2 days of nausea/vomiting, but little if any pain at the abdomen.  However, he has assoc diarrhea.  He says he is feeling slightly better today.   He has reduced the insulin, due to reduced PO intake.  He says cbg's are well-controlled.   Past Medical History:  Diagnosis Date  . ALLERGIC RHINITIS 08/10/2007  . BLADDER OUTLET OBSTRUCTION 11/13/2008  . DIABETES MELLITUS, TYPE II 08/10/2007  . ERECTILE DYSFUNCTION 11/13/2008  . HYPERLIPIDEMIA 08/10/2007  . HYPERTENSION 08/10/2007  . PERS HX NONCOMPLIANCE W/MED TX PRS HAZARDS HLTH 11/13/2008  . Seborrhea 11/13/2008  . 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 UNSPECIFIED PERIPHERAL VASCULAR DISEASE 11/13/2008    Past Surgical History:  Procedure Laterality Date  . VASECTOMY  1976    Social History   Socioeconomic History  . Marital status: Married    Spouse name: Not on file  . Number of children: Not on file  . Years of education: Not on file  . Highest education level: Not on file  Occupational History    Employer: [employer]    Comment: Works for [employer]  Social Needs  . Financial resource strain: Not on file  . Food insecurity:    Worry: Not on file    Inability: Not on file  . Transportation needs:    Medical: Not on file    Non-medical: Not on file  Tobacco Use  . Smoking status: Former Smoker    Last attempt to quit: 12/25/1985    Years since quitting: 32.2  . Smokeless tobacco: Never Used  Substance and Sexual Activity  . Alcohol use: Yes    Comment: 6/week  . Drug use: Not on file  . Sexual activity: Not on file  Lifestyle  . Physical activity:    Days per week: Not on file    Minutes per session: Not on file  . Stress: Not on file  Relationships  . Social connections:    Talks on phone: Not on file    Gets together: Not on file    Attends religious service: Not on file    Active  member of club or organization: Not on file    Attends meetings of clubs or organizations: Not on file    Relationship status: Not on file  . Intimate partner violence:    Fear of current or ex partner: Not on file    Emotionally abused: Not on file    Physically abused: Not on file    Forced sexual activity: Not on file  Other Topics Concern  . Not on file  Social History Narrative  . Not on file    Current Outpatient Medications on File Prior to Visit  Medication Sig Dispense Refill  . aspirin 81 MG tablet Take 81 mg by mouth daily.      Marland Kitchen glucose blood (RELION GLUCOSE TEST STRIPS) test strip Use as instructed     . insulin NPH Human (HUMULIN N,NOVOLIN N) 100 UNIT/ML injection Inject 0.3 mLs (30 Units total) into the skin at bedtime. 10 mL 11  . insulin regular (NOVOLIN R,HUMULIN R) 100 units/mL injection Inject 0.6 mLs (60 Units total) into the skin 2 (two) times daily before a meal. And syringes 2/day 40 mL 11  . LORATADINE-D 24HR 10-240 MG 24 hr tablet TAKE ONE TABLET BY MOUTH ONCE DAILY 90 tablet 2  .  losartan-hydrochlorothiazide (HYZAAR) 100-12.5 MG tablet Take 1 tablet by mouth daily. 90 tablet 3  . simvastatin (ZOCOR) 40 MG tablet TAKE ONE TABLET BY MOUTH ONCE DAILY IN THE EVENING 30 tablet 2  . tamsulosin (FLOMAX) 0.4 MG CAPS capsule TAKE ONE CAPSULE BY MOUTH ONCE DAILY 90 capsule 2  . traMADol (ULTRAM) 50 MG tablet TAKE 1 TABLET BY MOUTH EVERY 8 HOURS AS NEEDED 50 tablet 4  . fluticasone (FLONASE) 50 MCG/ACT nasal spray Place 2 sprays into the nose daily. 16 g 11   No current facility-administered medications on file prior to visit.     Allergies  Allergen Reactions  . Metformin   . Rosuvastatin   . Sulfonamide Derivatives     Family History  Problem Relation Age of Onset  . Heart disease Father   . Cancer Maternal Grandmother   . Stroke Maternal Grandfather   . Cancer Paternal Grandfather     BP (!) 160/90 (BP Location: Right Arm, Patient Position: Sitting,  Cuff Size: Normal)   Pulse 99   Temp 100 F (37.8 C) (Oral)   Wt 206 lb 3.2 oz (93.5 kg)   SpO2 94%   BMI 32.30 kg/m    Review of Systems Fever is resolved, but he has slight headache.  No BRBPR.     Objective:   Physical Exam VITAL SIGNS:  See vs page GENERAL: no distress LUNGS:  Clear to auscultation ABDOMEN: abdomen is soft, nontender.  no hepatosplenomegaly.  not distended.  no hernia.        Assessment & Plan:  Gastroenteritis, new. HTN: prob situational Insulin-requiring type 2 DM: well-controlled despite the illness, due to pt's insulin adjustment.   Patient Instructions  Please continue the same medications for blood pressure, as the elevation is prob due to the illness. I have sent a prescription to your pharmacy, for nausea. I hope you feel better soon.  If you don't feel better by next week, please call back.  Please call sooner if you get worse.       Diarrhea, Adult Diarrhea is frequent loose and watery bowel movements. Diarrhea can make you feel weak and cause you to become dehydrated. Dehydration can make you tired and thirsty, cause you to have a dry mouth, and decrease how often you urinate. Diarrhea typically lasts 2-3 days. However, it can last longer if it is a sign of something more serious. It is important to treat your diarrhea as told by your health care provider. Follow these instructions at home: Eating and drinking  Follow these recommendations as told by your health care provider:  Take an oral rehydration solution (ORS). This is a drink that is sold at pharmacies and retail stores.  Drink clear fluids, such as water, ice chips, diluted fruit juice, and low-calorie sports drinks.  Eat bland, easy-to-digest foods in small amounts as you are able. These foods include bananas, applesauce, rice, lean meats, toast, and crackers.  Avoid drinking fluids that contain a lot of sugar or caffeine, such as energy drinks, sports drinks, and  soda.  Avoid alcohol.  Avoid spicy or fatty foods.  General instructions  Drink enough fluid to keep your urine clear or pale yellow.  Wash your hands often. If soap and water are not available, use hand sanitizer.  Make sure that all people in your household wash their hands well and often.  Take over-the-counter and prescription medicines only as told by your health care provider.  Rest at home while you recover.  Watch your condition for any changes.  Take a warm bath to relieve any burning or pain from frequent diarrhea episodes.  Keep all follow-up visits as told by your health care provider. This is important. Contact a health care provider if:  You have a fever.  Your diarrhea gets worse.  You have new symptoms.  You cannot keep fluids down.  You feel light-headed or dizzy.  You have a headache  You have muscle cramps. Get help right away if:  You have chest pain.  You feel extremely weak or you faint.  You have bloody or black stools or stools that look like tar.  You have severe pain, cramping, or bloating in your abdomen.  You have trouble breathing or you are breathing very quickly.  Your heart is beating very quickly.  Your skin feels cold and clammy.  You feel confused.  You have signs of dehydration, such as: ? Dark urine, very little urine, or no urine. ? Cracked lips. ? Dry mouth. ? Sunken eyes. ? Sleepiness. ? Weakness. This information is not intended to replace advice given to you by your health care provider. Make sure you discuss any questions you have with your health care provider. Document Released: 12/01/2002 Document Revised: 04/20/2016 Document Reviewed: 08/17/2015 Elsevier Interactive Patient Education  [employer]2018 [employer]

## 2018-04-02 NOTE — Telephone Encounter (Signed)
3:45 PM today

## 2018-04-02 NOTE — Telephone Encounter (Signed)
Patient is needing to come in for flu like symptoms. He also stated he thinks he is needing a EKG that he would like to talk to you about stated he has had heartburn all night that he can not get rid of. Can you work him in sometime today or do I need to schedule him for sometime tomorrow?    Thanks!

## 2018-04-02 NOTE — Telephone Encounter (Signed)
Spoke with patient and he is at urgent care.

## 2018-04-02 NOTE — Patient Instructions (Addendum)
Please continue the same medications for blood pressure, as the elevation is prob due to the illness. I have sent a prescription to your pharmacy, for nausea. I hope you feel better soon.  If you don't feel better by next week, please call back.  Please call sooner if you get worse.       Diarrhea, Adult Diarrhea is frequent loose and watery bowel movements. Diarrhea can make you feel weak and cause you to become dehydrated. Dehydration can make you tired and thirsty, cause you to have a dry mouth, and decrease how often you urinate. Diarrhea typically lasts 2-3 days. However, it can last longer if it is a sign of something more serious. It is important to treat your diarrhea as told by your health care provider. Follow these instructions at home: Eating and drinking  Follow these recommendations as told by your health care provider:  Take an oral rehydration solution (ORS). This is a drink that is sold at pharmacies and retail stores.  Drink clear fluids, such as water, ice chips, diluted fruit juice, and low-calorie sports drinks.  Eat bland, easy-to-digest foods in small amounts as you are able. These foods include bananas, applesauce, rice, lean meats, toast, and crackers.  Avoid drinking fluids that contain a lot of sugar or caffeine, such as energy drinks, sports drinks, and soda.  Avoid alcohol.  Avoid spicy or fatty foods.  General instructions  Drink enough fluid to keep your urine clear or pale yellow.  Wash your hands often. If soap and water are not available, use hand sanitizer.  Make sure that all people in your household wash their hands well and often.  Take over-the-counter and prescription medicines only as told by your health care provider.  Rest at home while you recover.  Watch your condition for any changes.  Take a warm bath to relieve any burning or pain from frequent diarrhea episodes.  Keep all follow-up visits as told by your health care  provider. This is important. Contact a health care provider if:  You have a fever.  Your diarrhea gets worse.  You have new symptoms.  You cannot keep fluids down.  You feel light-headed or dizzy.  You have a headache  You have muscle cramps. Get help right away if:  You have chest pain.  You feel extremely weak or you faint.  You have bloody or black stools or stools that look like tar.  You have severe pain, cramping, or bloating in your abdomen.  You have trouble breathing or you are breathing very quickly.  Your heart is beating very quickly.  Your skin feels cold and clammy.  You feel confused.  You have signs of dehydration, such as: ? Dark urine, very little urine, or no urine. ? Cracked lips. ? Dry mouth. ? Sunken eyes. ? Sleepiness. ? Weakness. This information is not intended to replace advice given to you by your health care provider. Make sure you discuss any questions you have with your health care provider. Document Released: 12/01/2002 Document Revised: 04/20/2016 Document Reviewed: 08/17/2015 Elsevier Interactive Patient Education  [employer]2018 [employer]

## 2018-04-03 ENCOUNTER — Ambulatory Visit: Payer: Medicare Other | Admitting: Endocrinology

## 2018-05-27 ENCOUNTER — Other Ambulatory Visit: Payer: Self-pay | Admitting: Endocrinology

## 2018-08-09 ENCOUNTER — Encounter: Payer: Self-pay | Admitting: Endocrinology

## 2018-08-09 ENCOUNTER — Ambulatory Visit (INDEPENDENT_AMBULATORY_CARE_PROVIDER_SITE_OTHER): Payer: Medicare Other | Admitting: Endocrinology

## 2018-08-09 VITALS — BP 124/84 | HR 72 | Ht 67.0 in | Wt 209.0 lb

## 2018-08-09 DIAGNOSIS — M79643 Pain in unspecified hand: Secondary | ICD-10-CM | POA: Diagnosis not present

## 2018-08-09 DIAGNOSIS — Z794 Long term (current) use of insulin: Secondary | ICD-10-CM | POA: Diagnosis not present

## 2018-08-09 DIAGNOSIS — F528 Other sexual dysfunction not due to a substance or known physiological condition: Secondary | ICD-10-CM | POA: Diagnosis not present

## 2018-08-09 DIAGNOSIS — E119 Type 2 diabetes mellitus without complications: Secondary | ICD-10-CM

## 2018-08-09 LAB — POCT GLYCOSYLATED HEMOGLOBIN (HGB A1C): Hemoglobin A1C: 6.9 % — AB (ref 4.0–5.6)

## 2018-08-09 NOTE — Progress Notes (Signed)
Subjective:    Patient ID: Antonio Patterson, male    DOB: 1951/06/17, 67 y.o.   MRN: [id number]  HPI Pt returns for f/u of diabetes mellitus: DM type: Insulin-requiring type 2 Dx'ed: 1997 Complications: PAD Therapy: insulin since 2009 DKA: never Severe hypoglycemia: never Pancreatitis: never.  Other: he takes multiple daily injections; he takes human insulin, due to cost.   Interval history: no cbg record, but states cbg's are well-controlled.  There is no trend throughout the day.  Past Medical History:  Diagnosis Date  . ALLERGIC RHINITIS 08/10/2007  . BLADDER OUTLET OBSTRUCTION 11/13/2008  . DIABETES MELLITUS, TYPE II 08/10/2007  . ERECTILE DYSFUNCTION 11/13/2008  . HYPERLIPIDEMIA 08/10/2007  . HYPERTENSION 08/10/2007  . PERS HX NONCOMPLIANCE W/MED TX PRS HAZARDS HLTH 11/13/2008  . Seborrhea 11/13/2008  . 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 UNSPECIFIED PERIPHERAL VASCULAR DISEASE 11/13/2008    Past Surgical History:  Procedure Laterality Date  . VASECTOMY  1976    Social History   Socioeconomic History  . Marital status: Married    Spouse name: Not on file  . Number of children: Not on file  . Years of education: Not on file  . Highest education level: Not on file  Occupational History    Employer: [employer]    Comment: Works for [employer] company  Social Needs  . Financial resource strain: Not on file  . Food insecurity:    Worry: Not on file    Inability: Not on file  . Transportation needs:    Medical: Not on file    Non-medical: Not on file  Tobacco Use  . Smoking status: Former Smoker    Last attempt to quit: 12/25/1985    Years since quitting: 32.6  . Smokeless tobacco: Never Used  Substance and Sexual Activity  . Alcohol use: Yes    Comment: 6/week  . Drug use: Not on file  . Sexual activity: Not on file  Lifestyle  . Physical activity:    Days per week: Not on file    Minutes per session: Not on file  . Stress: Not on file   Relationships  . Social connections:    Talks on phone: Not on file    Gets together: Not on file    Attends religious service: Not on file    Active member of club or organization: Not on file    Attends meetings of clubs or organizations: Not on file    Relationship status: Not on file  . Intimate partner violence:    Fear of current or ex partner: Not on file    Emotionally abused: Not on file    Physically abused: Not on file    Forced sexual activity: Not on file  Other Topics Concern  . Not on file  Social History Narrative  . Not on file    Current Outpatient Medications on File Prior to Visit  Medication Sig Dispense Refill  . aspirin 81 MG tablet Take 81 mg by mouth daily.      Marland Kitchen. glucose blood (RELION GLUCOSE TEST STRIPS) test strip Use as instructed     . insulin NPH Human (HUMULIN N,NOVOLIN N) 100 UNIT/ML injection Inject 0.3 mLs (30 Units total) into the skin at bedtime. 10 mL 11  . insulin regular (NOVOLIN R,HUMULIN R) 100 units/mL injection Inject 0.6 mLs (60 Units total) into the skin 2 (two) times daily before a meal. And syringes 2/day 40 mL 11  . LORATADINE-D 24HR  10-240 MG 24 hr tablet TAKE ONE TABLET BY MOUTH ONCE DAILY 90 tablet 2  . losartan-hydrochlorothiazide (HYZAAR) 100-12.5 MG tablet Take 1 tablet by mouth daily. 90 tablet 3  . omeprazole (PRILOSEC) 40 MG capsule Take 1 capsule (40 mg total) by mouth daily. 90 capsule 3  . simvastatin (ZOCOR) 40 MG tablet TAKE ONE TABLET BY MOUTH ONCE DAILY IN THE EVENING 30 tablet 2  . tamsulosin (FLOMAX) 0.4 MG CAPS capsule TAKE 1 CAPSULE BY MOUTH ONCE DAILY 90 capsule 2  . traMADol (ULTRAM) 50 MG tablet TAKE 1 TABLET BY MOUTH EVERY 8 HOURS AS NEEDED 50 tablet 4  . fluticasone (FLONASE) 50 MCG/ACT nasal spray Place 2 sprays into the nose daily. 16 g 11   No current facility-administered medications on file prior to visit.     Allergies  Allergen Reactions  . Metformin   . Rosuvastatin   . Sulfonamide Derivatives      Family History  Problem Relation Age of Onset  . Heart disease Father   . Cancer Maternal Grandmother   . Stroke Maternal Grandfather   . Cancer Paternal Grandfather     BP 124/84   Pulse 72   Ht 5\' 7"  (1.702 m)   Wt 209 lb (94.8 kg)   SpO2 95%   BMI 32.73 kg/m    Review of Systems He denies hypoglycemia.  viagra no longer helps ED sxs.     Objective:   Physical Exam VITAL SIGNS:  See vs page GENERAL: no distress Pulses: foot pulses are intact bilaterally.   MSK: no deformity of the feet or ankles.  CV: trace bilat edema of the legs Skin:  no ulcer on the feet or ankles.  normal color and temp on the feet and ankles Neuro: sensation is intact to touch on the feet and ankles.   Ext: There is bilateral onychomycosis of the toenails  Lab Results  Component Value Date   HGBA1C 6.9 (A) 08/09/2018      Assessment & Plan:  Insulin-requiring type 2 DM, with PAD: well-controlled.  ED: worse  Patient Instructions  Please continue the same insulin check your blood sugar twice a day.  vary the time of day when you check, between before the 3 meals, and at bedtime.  also check if you have symptoms of your blood sugar being too high or too low.  please keep a record of the readings and bring it to your next appointment here (or you can bring the meter itself).  You can write it on any piece of paper.  please call us sooner if your blood sugar goes below 70, or if you have a lot of readings over 200.   blood tests are requested for you today.  We'll let you know about the results. If the blood test is normal, I would be happy to refer you to a urologist.

## 2018-08-09 NOTE — Patient Instructions (Addendum)
Please continue the same insulin check your blood sugar twice a day.  vary the time of day when you check, between before the 3 meals, and at bedtime.  also check if you have symptoms of your blood sugar being too high or too low.  please keep a record of the readings and bring it to your next appointment here (or you can bring the meter itself).  You can write it on any piece of paper.  please call us sooner if your blood sugar goes below 70, or if you have a lot of readings over 200.   blood tests are requested for you today.  We'll let you know about the results. If the blood test is normal, I would be happy to refer you to a urologist.

## 2018-08-11 LAB — TESTOSTERONE,FREE AND TOTAL
TESTOSTERONE: 429 ng/dL (ref 264–916)
Testosterone, Free: 5.7 pg/mL — ABNORMAL LOW (ref 6.6–18.1)

## 2018-08-13 ENCOUNTER — Other Ambulatory Visit: Payer: Self-pay | Admitting: Endocrinology

## 2018-08-31 DIAGNOSIS — M79641 Pain in right hand: Secondary | ICD-10-CM | POA: Diagnosis not present

## 2018-10-07 ENCOUNTER — Other Ambulatory Visit: Payer: Self-pay | Admitting: Endocrinology

## 2018-12-06 ENCOUNTER — Ambulatory Visit: Payer: Medicare Other | Admitting: Family Medicine

## 2018-12-06 ENCOUNTER — Telehealth: Payer: Self-pay | Admitting: Endocrinology

## 2018-12-06 ENCOUNTER — Other Ambulatory Visit: Payer: Self-pay

## 2018-12-06 MED ORDER — LOSARTAN POTASSIUM-HCTZ 100-12.5 MG PO TABS: 1.0000 | ORAL_TABLET | Freq: Every day | ORAL | 0 refills | Status: DC

## 2018-12-06 MED ORDER — LORATADINE-PSEUDOEPHEDRINE ER 10-240 MG PO TB24: 1.0000 | ORAL_TABLET | Freq: Every day | ORAL | 0 refills | Status: DC

## 2018-12-06 NOTE — Telephone Encounter (Signed)
Pt stated that he is not able to see his new PCP until January and would like to know if you will refill these 2 medications until then, please advise?

## 2018-12-06 NOTE — Telephone Encounter (Signed)
Rx's have been sent per Dr. George HughEllison's orders

## 2018-12-06 NOTE — Telephone Encounter (Signed)
Patient requests to be called at ph# [phone] re: a couple of medications (Losartan & Aloratadine-D).

## 2018-12-06 NOTE — Telephone Encounter (Signed)
Please refill x 3 months Further refills would have to be considered by new PCP   

## 2018-12-11 ENCOUNTER — Other Ambulatory Visit: Payer: Self-pay | Admitting: Endocrinology

## 2019-01-03 ENCOUNTER — Ambulatory Visit (INDEPENDENT_AMBULATORY_CARE_PROVIDER_SITE_OTHER): Payer: Medicare Other | Admitting: Family Medicine

## 2019-01-03 ENCOUNTER — Encounter: Payer: Self-pay | Admitting: Family Medicine

## 2019-01-03 VITALS — BP 143/81 | HR 89 | Temp 98.1°F | Ht 67.0 in | Wt 218.4 lb

## 2019-01-03 DIAGNOSIS — R1310 Dysphagia, unspecified: Secondary | ICD-10-CM

## 2019-01-03 DIAGNOSIS — R0989 Other specified symptoms and signs involving the circulatory and respiratory systems: Secondary | ICD-10-CM

## 2019-01-03 DIAGNOSIS — I152 Hypertension secondary to endocrine disorders: Secondary | ICD-10-CM | POA: Insufficient documentation

## 2019-01-03 DIAGNOSIS — Z794 Long term (current) use of insulin: Secondary | ICD-10-CM

## 2019-01-03 DIAGNOSIS — E1159 Type 2 diabetes mellitus with other circulatory complications: Secondary | ICD-10-CM | POA: Diagnosis not present

## 2019-01-03 DIAGNOSIS — I10 Essential (primary) hypertension: Secondary | ICD-10-CM

## 2019-01-03 DIAGNOSIS — E1169 Type 2 diabetes mellitus with other specified complication: Secondary | ICD-10-CM | POA: Insufficient documentation

## 2019-01-03 DIAGNOSIS — E782 Mixed hyperlipidemia: Secondary | ICD-10-CM | POA: Diagnosis not present

## 2019-01-03 NOTE — Progress Notes (Signed)
New patient office visit note:  Impression and Recommendations:    1. Type 2 diabetes mellitus with other specified complication, with long-term current use of insulin (HCC)   2. Hypertension associated with diabetes (HCC)   3. Mixed diabetic hyperlipidemia associated with type 2 diabetes mellitus (HCC)   4. Morbid obesity (HCC)   5. Globus pharyngeus   6. Swallowing problem     - Need for lab work in near future to establish baseline.  1. Encounter to Establish Care as New Patient - Extensive discussion held with patient regarding establishing as a new patient.  Discussed policies and practices here at the clinic, and answered all questions about care team and health management during appointment.  - Discussed need for patient to continue to obtain management and screenings with all established specialists.  Educated patient at length about the critical importance of keeping health maintenance up to date.  - Participated in lengthy conversation and all questions were answered.  - Discussed need for patient to establish with other specialists (GI, Pain Management, etc.) as recommended and referred.  - Educated patient about prudent use of medications such as tramadol.  Discussed need to establish controlled substance contract with patient in the future if needed for tramadol refills.  - Discussed the difference between physicals and Medicare Wellness visits.  2. Coughing & Swallowing Concerns - Globus Pharyngeus - Per patient, "sometimes things go down the wrong way," and he has trouble coughing sometimes if "something hits a spot wrong."  "It's always one spot that it hits."  - Recommended follow-up and evaluation with GI. - Referral placed to GI today.  3. Type 2 Diabetes; Management on Insulin - Patient prefers to have "just one doctor."  - We will take over care of diabetes management from Dr. Everardo All, but patient agrees to return to Endocrinology in the future if he  has the need for further evaluation.  4. Hypertension Associated with Diabetes - Blood pressure elevated on intake today. - Discussed goal blood pressure with patient today.  - Ambulatory BP monitoring encouraged. - Will continue to monitor and adjust treatment plan next appointment.  5. BMI Counseling - Body mass index is 34.21 kg/m Explained to patient what BMI refers to, and what it means medically.    Told patient to think about it as a "medical risk stratification measurement" and how increasing BMI is associated with increasing risk/ or worsening state of various diseases such as hypertension, hyperlipidemia, diabetes, premature OA, depression etc.  American Heart Association guidelines for healthy diet, basically Mediterranean diet, and exercise guidelines of 30 minutes 5 days per week or more discussed in detail.  Health counseling performed.  All questions answered.  6. Lifestyle & Preventative Health Maintenance - Advised patient to continue working toward exercising to improve overall mental, physical, and emotional health.    - Reviewed the "spokes of the wheel" of mood and health management.  Stressed the importance of ongoing prudent habits, including regular exercise, appropriate sleep hygiene, healthful dietary habits, and prayer/meditation to relax.  - Encouraged patient to engage in daily physical activity, especially a formal exercise routine.  Recommended that the patient eventually strive for at least 150 minutes of moderate cardiovascular activity per week according to guidelines established by the [facility].   - Healthy dietary habits encouraged, including low-carb, and high amounts of lean protein in diet.   - Patient should also consume adequate amounts of water.   Education and routine counseling performed. Handouts  provided.   Do not remove these below:  Orders Placed This Encounter  Procedures  . Ambulatory referral to Gastroenterology    No orders of the  defined types were placed in this encounter.   Medications Discontinued During This Encounter  Medication Reason  . insulin NPH Human (HUMULIN N,NOVOLIN N) 100 UNIT/ML injection Change in therapy  . simvastatin (ZOCOR) 40 MG tablet Duplicate      Gross side effects, risk and benefits, and alternatives of medications discussed with patient.  Patient is aware that all medications have potential side effects and we are unable to predict every side effect or drug-drug interaction that may occur.  Expresses verbal understanding and consents to current therapy plan and treatment regimen.  Return for medicare wellness early March- come fasting and 2) OV 1 wk later.  Please see AVS handed out to patient at the end of our visit for further patient instructions/ counseling done pertaining to today's office visit.    Note:  This document was prepared using Dragon voice recognition software and may include unintentional dictation errors.   This document serves as a record of services personally performed by Thomasene Loteborah Diamantina Edinger, DO. It was created on her behalf by Peggye FothergillKatherine Galloway, a trained medical scribe. The creation of this record is based on the scribe's personal observations and the provider's statements to them.   I have reviewed the above medical documentation for accuracy and completeness and I concur.  Thomasene Loteborah Jamisyn Langer, DO 01/04/2019 3:39 PM       ------------------------------------------------------------------------------------------------------------------------------------    Subjective:    Chief complaint:   Chief Complaint  Patient presents with  . Establish Care    HPI: Antonio Patterson is a pleasant 68 y.o. male who [facility] to review their medical history with me and establish care.   I asked the patient to review their chronic problem list with me to ensure everything was updated and accurate.    All recent office  visits with other providers, any medical records that patient brought in etc  - I reviewed today.     We asked pt to get us their [facility] records from [facility] providers/ specialists that they had seen within the past 3-5 years- if they are in private practice and/or do not work for [facility], [facility], [facility], [facility] or [facility] owned practice.  Told them to call their specialists to clarify this if they are not sure.   Dr. Everardo AllEllison was formerly his endocrinologist as well as his PCP, and patient no longer has a PCP.  Now he just follows up with Dr. Everardo AllEllison for Endocrinology management.  Social History Is a Landscape architectdraftsman.  "Drawing pretty much," Art gallery managercomputer drafting. Goes to job sites, observes field conditions and coordinates things.  Married to wife.  Tobacco Use Quit smoking in 1971. Thinks he may have smoked for 10 years. Did a lot of welding, breathed in a lot of different types of smoke. "I've had a lot of exposure to smoke."  Past Medical History States he was healthy until he turned [age]. "After that, I had high blood pressure, diabetes, everything."  - Hypertension Has had high blood pressure since he was 68 years old.  - Diabetes Mellitus Patient remarks that he "manages his diabetes himself."  doesn't take the amounts [of insulin] that Dr. Everardo AllEllison tells him to take; states he "bases it off what he eats."  His last A1c was "right under 7.0 I think."  Patient "  was on pills and such before the insulin," but "evidently I can't make it myself anymore."  - Throat Concerns - Followed up by ENT in the Past - Dr. Annalee Genta Has some trouble in his throat.  Notes that his mother has the same problem.  Describes this as "I can swallow spit wrong and sit there and cough for a while."  Went to [facility] to follow up about this in the past.  He had sinus surgery at that time, which he states "did nothing for his throat."  This sinus surgery was done in 2017.  Says "sometimes things go down  the wrong way," and he has trouble coughing sometimes if "something hits a spot wrong."  "It's always one spot that it hits."  Says "it's always something tiny [food] that causes it."  Denies concerns about things feeling stuck.  Denies issues swallowing.  Has become more noticeable over the past 5-10 years.  Never saw GI for this.  Has never had a colonoscopy.  - Hiatal Hernia - History of Endoscopy Hiatal hernia; had some endoscopies done in his 20's. Other than that, he hasn't been back to GI.  He has not had a colonoscopy.  - Peripheral Arterial Disease Indicates and describes a 100% blockage in his artery in the right leg.  Notes "the blockage is right in the bend [of the leg]" and that he has not had a stent placed "because the blockage is right in the bend."  Had a stress test done in the past.  Had an ultrasound done in his right leg to determine the blockage.  Does not think that the PAD has gotten worse.  If he walks for long periods of time, he feels the PAD more keenly.  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  States it works very well, and he doesn't take it very often.  He takes it "sometimes once per week."  Saw [facility] in the past, Dr. Darrelyn Hillock.  - Environmental and Seasonal Allergies States that his allergies are year-round.  - Benign Prostatic Hypertrophy States he does not have a urologist.  - GERD Reflux; takes extended release omeprazole for this.  He has been taking this for "a couple years."  Hasn't had any heartburn at all since starting the omeprazole, no matter what I eat.  - Visual Health Goes to [facility]  Dr. Carloyn Manner.    Wt Readings from Last 3 Encounters:  01/03/19 218 lb 6.4 oz (99.1 kg)  08/09/18 209 lb (94.8 kg)  04/02/18 206 lb 3.2 oz (93.5 kg)   BP Readings from Last 3 Encounters:  01/03/19 (!) 143/81  08/09/18 124/84  04/02/18 (!) 160/90   Pulse Readings  from Last 3 Encounters:  01/03/19 89  08/09/18 72  04/02/18 99   BMI Readings from Last 3 Encounters:  01/03/19 34.21 kg/m  08/09/18 32.73 kg/m  04/02/18 32.30 kg/m    Patient Care Team    Relationship Specialty Notifications Start End  Thomasene Lot, DO PCP - General Family Medicine  01/02/19   Romero Belling, MD Consulting Physician Endocrinology  01/03/19     Patient Active Problem List   Diagnosis Date Noted  . Morbid obesity (HCC) 01/03/2019    Priority: High  . Mixed diabetic hyperlipidemia associated with type 2 diabetes mellitus (HCC) 01/03/2019    Priority: High  . Hypertension associated with diabetes (HCC) 01/03/2019    Priority:  High  . Diabetes (HCC) 08/10/2007    Priority: High  . Gastroesophageal reflux disease without esophagitis 01/05/2017    Priority: Medium  . ALCOHOL USE 11/13/2008    Priority: Medium  . BLADDER OUTLET OBSTRUCTION 11/13/2008    Priority: Medium  . 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 Hand pain 08/24/2016    Priority: Low  . ERECTILE DYSFUNCTION 11/13/2008    Priority: Low  . ALLERGIC RHINITIS 08/10/2007    Priority: Low  . Chronic pansinusitis 04/06/2017  . Nasal turbinate hypertrophy 04/06/2017  . Cough 12/08/2016  . Wellness examination 07/09/2015  . Headache(784.0) 10/31/2013  . Screening for prostate cancer 03/28/2013  . ABNORMAL ELECTROCARDIOGRAM 10/21/2010  . 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 UNSPECIFIED PERIPHERAL VASCULAR DISEASE 11/13/2008  . SEBORRHEA 11/13/2008  . Dyslipidemia 08/10/2007  . Essential hypertension 08/10/2007       As reported by pt:  Past Medical History:  Diagnosis Date  . ALLERGIC RHINITIS 08/10/2007  . BLADDER OUTLET OBSTRUCTION 11/13/2008  . DIABETES MELLITUS, TYPE II 08/10/2007  . ERECTILE DYSFUNCTION 11/13/2008  . HYPERLIPIDEMIA 08/10/2007  . HYPERTENSION 08/10/2007  . PERS HX NONCOMPLIANCE W/MED TX PRS HAZARDS HLTH 11/13/2008  . Seborrhea 11/13/2008  .  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 UNSPECIFIED PERIPHERAL VASCULAR DISEASE 11/13/2008     Past Surgical History:  Procedure Laterality Date  . TURBINATE REDUCTION Bilateral 2017  . VASECTOMY  1976     Family History  Problem Relation Age of Onset  . Heart disease Father   . Cancer Maternal Grandmother        breast  . Stroke Maternal Grandfather   . Cancer Paternal Grandfather      Social History   Substance and Sexual Activity  Drug Use Never     Social History   Substance and Sexual Activity  Alcohol Use Yes   Comment: 6/week     Social History   Tobacco Use  Smoking Status Former Smoker  . Packs/day: 1.00  . Years: 10.00  . Pack years: 10.00  . Last attempt to quit: 1971  . Years since quitting: 49.0  Smokeless Tobacco Never Used     Current Meds  Medication Sig  . aspirin 81 MG tablet Take 81 mg by mouth daily.    . fluticasone (FLONASE) 50 MCG/ACT nasal spray Place 2 sprays into the nose daily.  Marland Kitchen. glucose blood (RELION GLUCOSE TEST STRIPS) test strip Use as instructed   . insulin regular (NOVOLIN R,HUMULIN R) 100 units/mL injection Inject 0.6 mLs (60 Units total) into the skin 2 (two) times daily before a meal. And syringes 2/day  . loratadine-pseudoephedrine (LORATADINE-D 24HR) 10-240 MG 24 hr tablet Take 1 tablet by mouth daily.  Marland Kitchen. losartan-hydrochlorothiazide (HYZAAR) 100-12.5 MG tablet TAKE 1 TABLET BY MOUTH ONCE DAILY  . omeprazole (PRILOSEC) 40 MG capsule Take 1 capsule (40 mg total) by mouth daily.  . simvastatin (ZOCOR) 40 MG tablet TAKE 1 TABLET BY MOUTH ONCE DAILY IN THE EVENING  . tamsulosin (FLOMAX) 0.4 MG CAPS capsule TAKE 1 CAPSULE BY MOUTH ONCE DAILY    Allergies: Metformin; Rosuvastatin; and Sulfonamide derivatives   Review of Systems  Constitutional: Negative for chills, diaphoresis, fever, malaise/fatigue and weight loss.  HENT: Positive for hearing loss (chronic). Negative for congestion, sore throat and tinnitus.     Eyes: Negative for blurred vision, double vision and photophobia.  Respiratory: Negative for cough and wheezing.   Cardiovascular: Negative for chest pain and palpitations.  Gastrointestinal:  Negative for blood in stool, diarrhea, nausea and vomiting.  Genitourinary: Negative for dysuria, frequency and urgency.  Musculoskeletal: Positive for joint pain (chronic) and myalgias (chronic).  Skin: Negative for itching and rash.  Neurological: Negative for dizziness, focal weakness, weakness and headaches.  Endo/Heme/Allergies: Positive for environmental allergies (chronic). Negative for polydipsia. Does not bruise/bleed easily.       Cyst on neck.  Psychiatric/Behavioral: Positive for memory loss (chronic, short-term memory loss). Negative for depression. The patient is not nervous/anxious and does not have insomnia.         Objective:   Blood pressure (!) 143/81, pulse 89, temperature 98.1 F (36.7 C), height 5\' 7"  (1.702 m), weight 218 lb 6.4 oz (99.1 kg). Body mass index is 34.21 kg/m. General: Well Developed, well nourished, and in no acute distress.  Neuro: Alert and oriented x3, extra-ocular muscles intact, sensation grossly intact.  HEENT:[place]/AT, PERRLA, neck supple, No carotid bruits Skin: no gross rashes  Cardiac: Regular rate and rhythm Respiratory: Essentially clear to auscultation bilaterally. Not using accessory muscles, speaking in full sentences.  Abdominal: not grossly distended Musculoskeletal: Ambulates w/o diff, FROM * 4 ext.  Vasc: less 2 sec cap RF, warm and pink  Psych:  No HI/SI, judgement and insight good, Euthymic mood. Full Affect.    No results found for this or any previous visit (from the past 2160 hour(s)).

## 2019-01-03 NOTE — Patient Instructions (Signed)
Your goal blood pressure should be 130/80 or less on a regular basis, or medications should be started/ modified.    Normal blood pressure is less than 120/80.    Hypertension Hypertension, commonly called high blood pressure, is when the force of blood pumping through the arteries is too strong. The arteries are the blood vessels that carry blood from the heart throughout the body. Hypertension forces the heart to work harder to pump blood and may cause arteries to become narrow or stiff. Having untreated or uncontrolled hypertension can cause heart attacks, strokes, kidney disease, and other problems. A blood pressure reading consists of a higher number over a lower number. Ideally, your blood pressure should be below 120/80. The first ("top") number is called the systolic pressure. It is a measure of the pressure in your arteries as your heart beats. The second ("bottom") number is called the diastolic pressure. It is a measure of the pressure in your arteries as the heart relaxes. What are the causes? The cause of this condition is not known. What increases the risk? Some risk factors for high blood pressure are under your control. Others are not. Factors you can change  Smoking.  Having type 2 diabetes mellitus, high cholesterol, or both.  Not getting enough exercise or physical activity.  Being overweight.  Having too much fat, sugar, calories, or salt (sodium) in your diet.  Drinking too much alcohol. Factors that are difficult or impossible to change  Having chronic kidney disease.  Having a family history of high blood pressure.  Age. Risk increases with age.  Race. You may be at higher risk if you are African-American.  Gender. Men are at higher risk than women before age 64. After age 89, women are at higher risk than men.  Having obstructive sleep apnea.  Stress. What are the signs or symptoms? Extremely high blood pressure (hypertensive crisis) may  cause:  Headache.  Anxiety.  Shortness of breath.  Nosebleed.  Nausea and vomiting.  Severe chest pain.  Jerky movements you cannot control (seizures).  How is this diagnosed? This condition is diagnosed by measuring your blood pressure while you are seated, with your arm resting on a surface. The cuff of the blood pressure monitor will be placed directly against the skin of your upper arm at the level of your heart. It should be measured at least twice using the same arm. Certain conditions can cause a difference in blood pressure between your right and left arms. Certain factors can cause blood pressure readings to be lower or higher than normal (elevated) for a short period of time:  When your blood pressure is higher when you are in a health care provider's office than when you are at home, this is called white coat hypertension. Most people with this condition do not need medicines.  When your blood pressure is higher at home than when you are in a health care provider's office, this is called masked hypertension. Most people with this condition may need medicines to control blood pressure.  If you have a high blood pressure reading during one visit or you have normal blood pressure with other risk factors:  You may be asked to return on a different day to have your blood pressure checked again.  You may be asked to monitor your blood pressure at home for 1 week or longer.  If you are diagnosed with hypertension, you may have other blood or imaging tests to help your health care  provider understand your overall risk for other conditions. How is this treated? This condition is treated by making healthy lifestyle changes, such as eating healthy foods, exercising more, and reducing your alcohol intake. Your health care provider may prescribe medicine if lifestyle changes are not enough to get your blood pressure under control, and if:  Your systolic blood pressure is above  130.  Your diastolic blood pressure is above 80.  Your personal target blood pressure may vary depending on your medical conditions, your age, and other factors. Follow these instructions at home: Eating and drinking  Eat a diet that is high in fiber and potassium, and low in sodium, added sugar, and fat. An example eating plan is called the DASH (Dietary Approaches to Stop Hypertension) diet. To eat this way: ? Eat plenty of fresh fruits and vegetables. Try to fill half of your plate at each meal with fruits and vegetables. ? Eat whole grains, such as whole wheat pasta, brown rice, or whole grain bread. Fill about one quarter of your plate with whole grains. ? Eat or drink low-fat dairy products, such as skim milk or low-fat yogurt. ? Avoid fatty cuts of meat, processed or cured meats, and poultry with skin. Fill about one quarter of your plate with lean proteins, such as fish, chicken without skin, beans, eggs, and tofu. ? Avoid premade and processed foods. These tend to be higher in sodium, added sugar, and fat.  Reduce your daily sodium intake. Most people with hypertension should eat less than 1,500 mg of sodium a day.  Limit alcohol intake to no more than 1 drink a day for nonpregnant women and 2 drinks a day for men. One drink equals 12 oz of beer, 5 oz of wine, or 1 oz of hard liquor. Lifestyle  Work with your health care provider to maintain a healthy body weight or to lose weight. Ask what an ideal weight is for you.  Get at least 30 minutes of exercise that causes your heart to beat faster (aerobic exercise) most days of the week. Activities may include walking, swimming, or biking.  Include exercise to strengthen your muscles (resistance exercise), such as pilates or lifting weights, as part of your weekly exercise routine. Try to do these types of exercises for 30 minutes at least 3 days a week.  Do not use any products that contain nicotine or tobacco, such as cigarettes and  e-cigarettes. If you need help quitting, ask your health care provider.  Monitor your blood pressure at home as told by your health care provider.  Keep all follow-up visits as told by your health care provider. This is important. Medicines  Take over-the-counter and prescription medicines only as told by your health care provider. Follow directions carefully. Blood pressure medicines must be taken as prescribed.  Do not skip doses of blood pressure medicine. Doing this puts you at risk for problems and can make the medicine less effective.  Ask your health care provider about side effects or reactions to medicines that you should watch for. Contact a health care provider if:  You think you are having a reaction to a medicine you are taking.  You have headaches that keep coming back (recurring).  You feel dizzy.  You have swelling in your ankles.  You have trouble with your vision. Get help right away if:  You develop a severe headache or confusion.  You have unusual weakness or numbness.  You feel faint.  You have severe pain in  your chest or abdomen.  You vomit repeatedly.  You have trouble breathing. Summary  Hypertension is when the force of blood pumping through your arteries is too strong. If this condition is not controlled, it may put you at risk for serious complications.  Your personal target blood pressure may vary depending on your medical conditions, your age, and other factors. For most people, a normal blood pressure is less than 120/80.  Hypertension is treated with lifestyle changes, medicines, or a combination of both. Lifestyle changes include weight loss, eating a healthy, low-sodium diet, exercising more, and limiting alcohol. This information is not intended to replace advice given to you by your health care provider. Make sure you discuss any questions you have with your health care provider. Document Released: 12/11/2005 Document Revised: 11/08/2016  Document Reviewed: 11/08/2016 Elsevier Interactive Patient Education  2018 [employer].    How to Take Your Blood Pressure   Blood pressure is a measurement of how strongly your blood is pressing against the walls of your arteries. Arteries are blood vessels that carry blood from your heart throughout your body. Your health care provider takes your blood pressure at each office visit. You can also take your own blood pressure at home with a blood pressure machine. You may need to take your own blood pressure:  To confirm a diagnosis of high blood pressure (hypertension).  To monitor your blood pressure over time.  To make sure your blood pressure medicine is working.  Supplies needed: To take your blood pressure, you will need a blood pressure machine. You can buy a blood pressure machine, or blood pressure monitor, at most drugstores or online. There are several types of home blood pressure monitors. When choosing one, consider the following:  Choose a monitor that has an arm cuff.  Choose a monitor that wraps snugly around your upper arm. You should be able to fit only one finger between your arm and the cuff.  Do not choose a monitor that measures your blood pressure from your wrist or finger.  Your health care provider can suggest a reliable monitor that will meet your needs. How to prepare To get the most accurate reading, avoid the following for 30 minutes before you check your blood pressure:  Drinking caffeine.  Drinking alcohol.  Eating.  Smoking.  Exercising.  Five minutes before you check your blood pressure:  Empty your bladder.  Sit quietly without talking in a dining chair, rather than in a soft couch or armchair.  How to take your blood pressure To check your blood pressure, follow the instructions in the manual that came with your blood pressure monitor. If you have a digital blood pressure monitor, the instructions may be as follows: 1. Sit up  straight. 2. Place your feet on the floor. Do not cross your ankles or legs. 3. Rest your left arm at the level of your heart on a table or desk or on the arm of a chair. 4. Pull up your shirt sleeve. 5. Wrap the blood pressure cuff around the upper part of your left arm, 1 inch (2.5 cm) above your elbow. It is best to wrap the cuff around bare skin. 6. Fit the cuff snugly around your arm. You should be able to place only one finger between the cuff and your arm. 7. Position the cord inside the groove of your elbow. 8. Press the power button. 9. Sit quietly while the cuff inflates and deflates. 10. Read the digital reading on  the monitor screen and write it down (record it). 11. Wait 2-3 minutes, then repeat the steps, starting at step 1.  What does my blood pressure reading mean? A blood pressure reading consists of a higher number over a lower number. Ideally, your blood pressure should be below 120/80. The first ("top") number is called the systolic pressure. It is a measure of the pressure in your arteries as your heart beats. The second ("bottom") number is called the diastolic pressure. It is a measure of the pressure in your arteries as the heart relaxes. Blood pressure is classified into four stages. The following are the stages for adults who do not have a short-term serious illness or a chronic condition. Systolic pressure and diastolic pressure are measured in a unit called mm Hg. Normal  Systolic pressure: below 353.  Diastolic pressure: below 80. Elevated  Systolic pressure: 614-431.  Diastolic pressure: below 80. Hypertension stage 1  Systolic pressure: 540-086.  Diastolic pressure: 76-19. Hypertension stage 2  Systolic pressure: 509 or above.  Diastolic pressure: 90 or above. You can have prehypertension or hypertension even if only the systolic or only the diastolic number in your reading is higher than normal. Follow these instructions at home:  Check your blood  pressure as often as recommended by your health care provider.  Take your monitor to the next appointment with your health care provider to make sure: ? That you are using it correctly. ? That it provides accurate readings.  Be sure you understand what your goal blood pressure numbers are.  Tell your health care provider if you are having any side effects from blood pressure medicine. Contact a health care provider if:  Your blood pressure is consistently high. Get help right away if:  Your systolic blood pressure is higher than 180.  Your diastolic blood pressure is higher than 110. This information is not intended to replace advice given to you by your health care provider. Make sure you discuss any questions you have with your health care provider. Document Released: 05/19/2016 Document Revised: 08/01/2016 Document Reviewed: 05/19/2016 Elsevier Interactive Patient Education  2018 [employer].      Diabetes Mellitus and Standards of Medical Care  Managing diabetes (diabetes mellitus) can be complicated. Your diabetes treatment may be managed by a team of health care providers, including:  A diet and nutrition specialist (registered dietitian).  A nurse.  A certified diabetes educator (CDE).  A diabetes specialist (endocrinologist).  An eye doctor.  A primary care provider.  A dentist.  Your health care providers follow a schedule in order to help you get the best quality of care. The following schedule is a general guideline for your diabetes management plan. Your health care providers may also give you more specific instructions.  HbA1c (hemoglobin A1c) test This test provides information about blood sugar (glucose) control over the previous 2-3 months. It is used to check whether your diabetes management plan needs to be adjusted.  If you are meeting your treatment goals, this test is done at least 2 times a year.  If you are not meeting treatment goals or if  your treatment goals have changed, this test is done 4 times a year.  Blood pressure test  This test is done at every routine medical visit. For most people, the goal is less than 130/80. Ask your health care provider what your goal blood pressure should be.  Dental and eye exams  Visit your dentist two times a year.  If  you have type 1 diabetes, get an eye exam 3-5 years after you are diagnosed, and then once a year after your first exam. ? If you were diagnosed with type 1 diabetes as a child, get an eye exam when you are age 61 or older and have had diabetes for 3-5 years. After the first exam, you should get an eye exam once a year.  If you have type 2 diabetes, have an eye exam as soon as you are diagnosed, and then once a year after your first exam.  Foot care exam  Visual foot exams are done at every routine medical visit. The exams check for cuts, bruises, redness, blisters, sores, or other problems with the feet.  A complete foot exam is done by your health care provider once a year. This exam includes an inspection of the structure and skin of your feet, and a check of the pulses and sensation in your feet. ? Type 1 diabetes: Get your first exam 3-5 years after diagnosis. ? Type 2 diabetes: Get your first exam as soon as you are diagnosed.  Check your feet every day for cuts, bruises, redness, blisters, or sores. If you have any of these or other problems that are not healing, contact your health care provider.  Kidney function test (urine microalbumin)  This test is done once a year. ? Type 1 diabetes: Get your first test 5 years after diagnosis. ? Type 2 diabetes: Get your first test as soon as you are diagnosed._  If you have chronic kidney disease (CKD), get a serum creatinine and estimated glomerular filtration rate (eGFR) test once a year.  Lipid profile (cholesterol, HDL, LDL, triglycerides)  This test should be done when you are diagnosed with diabetes, and every  5 years after the first test. If you are on medicines to lower your cholesterol, you may need to get this test done every year. ? The goal for LDL is less than 100 mg/dL (5.5 mmol/L). If you are at high risk, the goal is less than 70 mg/dL (3.9 mmol/L). ? The goal for HDL is 40 mg/dL (2.2 mmol/L) for men and 50 mg/dL(2.8 mmol/L) for women. An HDL cholesterol of 60 mg/dL (3.3 mmol/L) or higher gives some protection against heart disease. ? The goal for triglycerides is less than 150 mg/dL (8.3 mmol/L).  Immunizations  The yearly flu (influenza) vaccine is recommended for everyone 6 months or older who has diabetes.  The pneumonia (pneumococcal) vaccine is recommended for everyone 2 years or older who has diabetes. If you are 34 or older, you may get the pneumonia vaccine as a series of two separate shots.  The hepatitis B vaccine is recommended for adults shortly after they have been diagnosed with diabetes.  The Tdap (tetanus, diphtheria, and pertussis) vaccine should be given: ? According to normal childhood vaccination schedules, for children. ? Every 10 years, for adults who have diabetes.  The shingles vaccine is recommended for people who have had chicken pox and are 50 years or older.  Mental and emotional health  Screening for symptoms of eating disorders, anxiety, and depression is recommended at the time of diagnosis and afterward as needed. If your screening shows that you have symptoms (you have a positive screening result), you may need further evaluation and be referred to a mental health care provider.  Diabetes self-management education  Education about how to manage your diabetes is recommended at diagnosis and ongoing as needed.  Treatment plan  Your treatment  plan will be reviewed at every medical visit.  Summary  Managing diabetes (diabetes mellitus) can be complicated. Your diabetes treatment may be managed by a team of health care providers.  Your health care  providers follow a schedule in order to help you get the best quality of care.  Standards of care including having regular physical exams, blood tests, blood pressure monitoring, immunizations, screening tests, and education about how to manage your diabetes.  Your health care providers may also give you more specific instructions based on your individual health.      Type 2 Diabetes Mellitus, Self Care, Adult Caring for yourself after you have been diagnosed with type 2 diabetes (type 2 diabetes mellitus) means keeping your blood sugar (glucose) under control with a balance of:  Nutrition.  Exercise.  Lifestyle changes.  Medicines or insulin, if necessary.  Support from your team of health care providers and others.  The following information explains what you need to know to manage your diabetes at home. What do I need to do to manage my blood glucose?  Check your blood glucose every day, as often as told by your health care provider.  Contact your health care provider if your blood glucose is above your target for 2 tests in a row.  Have your A1c (hemoglobin A1c) level checked at least two times a year, or as often as told by your health care provider. Your health care provider will set individualized treatment goals for you. Generally, the goal of treatment is to maintain the following blood glucose levels:  Before meals (preprandial): 80-130 mg/dL (4.4-7.2 mmol/L).  After meals (postprandial): below 180 mg/dL (10 mmol/L).  A1c level: less than 7%.  What do I need to know about hyperglycemia and hypoglycemia? What is hyperglycemia? Hyperglycemia, also called high blood glucose, occurs when blood glucose is too high.Make sure you know the early signs of hyperglycemia, such as:  Increased thirst.  Hunger.  Feeling very tired.  Needing to urinate more often than usual.  Blurry vision.  What is hypoglycemia? Hypoglycemia, also called low blood glucose,  occurswith a blood glucose level at or below 70 mg/dL (3.9 mmol/L). The risk for hypoglycemia increases during or after exercise, during sleep, during illness, and when skipping meals or not eating for a long time (fasting). It is important to know the symptoms of hypoglycemia and treat it right away. Always have a 15-gram rapid-acting carbohydrate snack with you to treat low blood glucose. Family members and close friends should also know the symptoms and should understand how to treat hypoglycemia, in case you are not able to treat yourself. What are the symptoms of hypoglycemia? Hypoglycemia symptoms can include:  Hunger.  Anxiety.  Sweating and feeling clammy.  Confusion.  Dizziness or feeling light-headed.  Sleepiness.  Nausea.  Increased heart rate.  Headache.  Blurry vision.  Seizure.  Nightmares.  Tingling or numbness around the mouth, lips, or tongue.  A change in speech.  Decreased ability to concentrate.  A change in coordination.  Restless sleep.  Tremors or shakes.  Fainting.  Irritability.  How do I treat hypoglycemia?  If you are alert and able to swallow safely, follow the 15:15 rule:  Take 15 grams of a rapid-acting carbohydrate. Rapid-acting options include: ? 1 tube of glucose gel. ? 3 glucose pills. ? 6-8 pieces of hard candy. ? 4 oz (120 mL) of fruit juice. ? 4 oz (120 mL) of regular (not diet) soda.  Check your blood glucose 15  minutes after you take the carbohydrate.  If the repeat blood glucose level is still at or below 70 mg/dL (3.9 mmol/L), take 15 grams of a carbohydrate again.  If your blood glucose level does not increase above 70 mg/dL (3.9 mmol/L) after 3 tries, seek emergency medical care.  After your blood glucose level returns to normal, eat a meal or a snack within 1 hour.  How do I treat severe hypoglycemia? Severe hypoglycemia is when your blood glucose level is at or below 54 mg/dL (3 mmol/L). Severe hypoglycemia  is an emergency. Do not wait to see if the symptoms will go away. Get medical help right away. Call your local emergency services (911 in the [place] Do not drive yourself to the hospital. If you have severe hypoglycemia and you cannot eat or drink, you may need an injection of glucagon. A family member or close friend should learn how to check your blood glucose and how to give you a glucagon injection. Ask your health care provider if you need to have an emergency glucagon injection kit available. Severe hypoglycemia may need to be treated in a hospital. The treatment may include getting glucose through an IV tube. You may also need treatment for the cause of your hypoglycemia. Can having diabetes put me at risk for other conditions? Having diabetes can put you at risk for other long-term (chronic) conditions, such as heart disease and kidney disease. Your health care provider may prescribe medicines to help prevent complications from diabetes. These medicines may include:  Aspirin.  Medicine to lower cholesterol.  Medicine to control blood pressure.  What else can I do to manage my diabetes? Take your diabetes medicines as told  If your health care provider prescribed insulin or diabetes medicines, take them every day.  Do not run out of insulin or other diabetes medicines that you take. Plan ahead so you always have these available.  If you use insulin, adjust your dosage based on how physically active you are and what foods you eat. Your health care provider will tell you how to adjust your dosage. Make healthy food choices  The things that you eat and drink affect your blood glucose and your insulin dosage. Making good choices helps to control your diabetes and prevent other health problems. A healthy meal plan includes eating lean proteins, complex carbohydrates, fresh fruits and vegetables, low-fat dairy products, and healthy fats. Make an appointment to see a diet and nutrition  specialist (registered dietitian) to help you create an eating plan that is right for you. Make sure that you:  Follow instructions from your health care provider about eating or drinking restrictions.  Drink enough fluid to keep your urine clear or pale yellow.  Eat healthy snacks between nutritious meals.  Track the carbohydrates that you eat. Do this by reading food labels and learning the standard serving sizes of foods.  Follow your sick day plan whenever you cannot eat or drink as usual. Make this plan in advance with your health care provider.  Stay active  Exercise regularly, as told by your health care provider. This may include:  Stretching and doing strength exercises, such as yoga or weightlifting, at least 2 times a week.  Doing at least 150 minutes of moderate-intensity or vigorous-intensity exercise each week. This could be brisk walking, biking, or water aerobics. ? Spread out your activity over at least 3 days of the week. ? Do not go more than 2 days in a row without doing  some kind of physical activity.  When you start a new exercise or activity, work with your health care provider to adjust your insulin, medicines, or food intake as needed. Make healthy lifestyle choices  Do not use any tobacco products, such as cigarettes, chewing tobacco, and e-cigarettes. If you need help quitting, ask your health care provider.  If your health care provider says that alcohol is safe for you, limit alcohol intake to no more than 1 drink per day for nonpregnant women and 2 drinks per day for men. One drink equals 12 oz of beer, 5 oz of wine, or 1 oz of hard liquor.  Learn to manage stress. If you need help with this, ask your health care provider. Care for your body   Keep your immunizations up to date. In addition to getting vaccinations as told by your health care provider, it is recommended that you get vaccinated against the following illnesses: ? The flu (influenza). Get  a flu shot every year. ? Pneumonia. ? Hepatitis B.  Schedule an eye exam soon after your diagnosis, and then one time every year after that.  Check your skin and feet every day for cuts, bruises, redness, blisters, or sores. Schedule a foot exam with your health care provider once every year.  Brush your teeth and gums two times a day, and floss at least one time a day. Visit your dentist at least once every 6 months.  Maintain a healthy weight. General instructions  Take over-the-counter and prescription medicines only as told by your health care provider.  Share your diabetes management plan with people in your workplace, school, and household.  Check your urine for ketones when you are ill and as told by your health care provider.  Ask your health care provider: ? Do I need to meet with a diabetes educator? ? Where can I find a support group for people with diabetes?  Carry a medical alert card or wear medical alert jewelry.  Keep all follow-up visits as told by your health care provider. This is important. Where to find more information: For more information about diabetes, visit:  American Diabetes Association (ADA): www.diabetes.org  American Association of Diabetes Educators (AADE): www.diabeteseducator.org/patient-resources  This information is not intended to replace advice given to you by your health care provider. Make sure you discuss any questions you have with your health care provider. Document Released: 04/03/2016 Document Revised: 05/18/2016 Document Reviewed: 01/14/2016 Elsevier Interactive Patient Education  2017 [employer].      Blood Glucose Monitoring, Adult Monitoring your blood sugar (glucose) helps you manage your diabetes. It also helps you and your health care provider determine how well your diabetes management plan is working. Blood glucose monitoring involves checking your blood glucose as often as directed, and keeping a record (log) of your  results over time. Why should I monitor my blood glucose? Checking your blood glucose regularly can:  Help you understand how food, exercise, illnesses, and medicines affect your blood glucose.  Let you know what your blood glucose is at any time. You can quickly tell if you are having low blood glucose (hypoglycemia) or high blood glucose (hyperglycemia).  Help you and your health care provider adjust your medicines as needed.  When should I check my blood glucose? Follow instructions from your health care provider about how often to check your blood glucose.   This may depend on:  The type of diabetes you have.  How well-controlled your diabetes is.  Medicines you  are taking.  If you have type 1 diabetes:  Check your blood glucose at least 2 times a day.  Also check your blood glucose: ? Before every insulin injection. ? Before and after exercise. ? Between meals. ? 2 hours after a meal. ? Occasionally between 2:00 a.m. and 3:00 a.m., as directed. ? Before potentially dangerous tasks, like driving or using heavy machinery. ? At bedtime.  You may need to check your blood glucose more often, up to 6-10 times a day: ? If you use an insulin pump. ? If you need multiple daily injections (MDI). ? If your diabetes is not well-controlled. ? If you are ill. ? If you have a history of severe hypoglycemia. ? If you have a history of not knowing when your blood glucose is getting low (hypoglycemia unawareness).  If you have type 2 diabetes:  If you take insulin or other diabetes medicines, check your blood glucose at least 2 times a day.  If you are on intensive insulin therapy, check your blood glucose at least 4 times a day. Occasionally, you may also need to check between 2:00 a.m. and 3:00 a.m., as directed.  Also check your blood glucose: ? Before and after exercise. ? Before potentially dangerous tasks, like driving or using heavy machinery.  You may need to check your  blood glucose more often if: ? Your medicine is being adjusted. ? Your diabetes is not well-controlled. ? You are ill.  What is a blood glucose log?  A blood glucose log is a record of your blood glucose readings. It helps you and your health care provider: ? Look for patterns in your blood glucose over time. ? Adjust your diabetes management plan as needed.  Every time you check your blood glucose, write down your result and notes about things that may be affecting your blood glucose, such as your diet and exercise for the day.  Most glucose meters store a record of glucose readings in the meter. Some meters allow you to download your records to a computer. How do I check my blood glucose? Follow these steps to get accurate readings of your blood glucose: Supplies needed   Blood glucose meter.  Test strips for your meter. Each meter has its own strips. You must use the strips that come with your meter.  A needle to prick your finger (lancet). Do not use lancets more than once.  A device that holds the lancet (lancing device).  A journal or log book to write down your results.  Procedure  Wash your hands with soap and water.  Prick the side of your finger (not the tip) with the lancet. Use a different finger each time.  Gently rub the finger until a small drop of blood appears.  Follow instructions that come with your meter for inserting the test strip, applying blood to the strip, and using your blood glucose meter.  Write down your result and any notes.  Alternative testing sites  Some meters allow you to use areas of your body other than your finger (alternative sites) to test your blood.  If you think you may have hypoglycemia, or if you have hypoglycemia unawareness, do not use alternative sites. Use your finger instead.  Alternative sites may not be as accurate as the fingers, because blood flow is slower in these areas. This means that the result you get may be  delayed, and it may be different from the result that you would get from your finger.  The most common alternative sites are: ? Forearm. ? Thigh. ? Palm of the hand.  Additional tips  Always keep your supplies with you.  If you have questions or need help, all blood glucose meters have a 24-hour "hotline" number that you can call. You may also contact your health care provider.  After you use a few boxes of test strips, adjust (calibrate) your blood glucose meter by following instructions that came with your meter.    The American Diabetes Association suggests the following targets for most nonpregnant adults with diabetes.  More or less stringent glycemic goals may be appropriate for each individual.  A1C: Less than 7% A1C may also be reported as eAG: Less than 154 mg/dl Before a meal (preprandial plasma glucose): 80-130 mg/dl 1-2 hours after beginning of the meal (Postprandial plasma glucose)*: Less than 180 mg/dl  *Postprandial glucose may be targeted if A1C goals are not met despite reaching preprandial glucose goals.   GOALS in short:  The goals are for the Hgb A1C to be less than 7.0 & blood pressure to be less than 130/80.    It is recommended that all diabetics are educated on and follow a healthy diabetic diet, exercise for 30 minutes 3-4 times per week (walking, biking, swimming, or machine), monitor blood glucose readings and bring that record with you to be reviewed at your next office visit.     You should be checking fasting blood sugars- especially after you eat poorly or eat really healthy, and also check 2 hour postprandial blood sugars after largest meal of the day.    Write these down and bring in your log at each office visit.    You will need to be seen every 3 months by the provider managing your Diabetes unless told otherwise by that provider.   You will need yearly eye exams from an eye specialist and foot exams to check the nerves of your feet.  Also, your  urine should be checked yearly as well to make sure excess protein is not present.   If you are checking your blood pressure at home, please record it and bring it to your next office visit.    Follow the Dietary Approaches to Stop Hypertension (DASH) diet (3 servings of fruit and vegetables daily, whole grains, low sodium, low-fat proteins).  See below.    Lastly, when it comes to your cholesterol, the goal is to have the HDL (good cholesterol) >40, and the LDL (bad cholesterol) <100.   It is recommended that you follow a heart healthy, low saturated and trans-fat diet and exercise for 30 minutes at least 5 times a week.     (( Check out the DASH diet = 1.5 Gram Low Sodium Diet   A 1.5 gram sodium diet restricts the amount of sodium in the diet to no more than 1.5 g or 1500 mg daily.  The American Heart Association recommends Americans over the age of 65 to consume no more than 1500 mg of sodium each day to reduce the risk of developing high blood pressure.  Research also shows that limiting sodium may reduce heart attack and stroke risk.  Many foods contain sodium for flavor and sometimes as a preservative.  When the amount of sodium in a diet needs to be low, it is important to know what to look for when choosing foods and drinks.  The following includes some information and guidelines to help make it easier for you to adapt to a  low sodium diet.    QUICK TIPS  Do not add salt to food.  Avoid convenience items and fast food.  Choose unsalted snack foods.  Buy lower sodium products, often labeled as "lower sodium" or "no salt added."  Check food labels to learn how much sodium is in 1 serving.  When eating at a restaurant, ask that your food be prepared with less salt or none, if possible.    READING FOOD LABELS FOR SODIUM INFORMATION  The nutrition facts label is a good place to find how much sodium is in foods. Look for products with no more than 400 mg of sodium per serving.  Remember  that 1.5 g = 1500 mg.  The food label may also list foods as:  Sodium-free: Less than 5 mg in a serving.  Very low sodium: 35 mg or less in a serving.  Low-sodium: 140 mg or less in a serving.  Light in sodium: 50% less sodium in a serving. For example, if a food that usually has 300 mg of sodium is changed to become light in sodium, it will have 150 mg of sodium.  Reduced sodium: 25% less sodium in a serving. For example, if a food that usually has 400 mg of sodium is changed to reduced sodium, it will have 300 mg of sodium.    CHOOSING FOODS  Grains  Avoid: Salted crackers and snack items. Some cereals, including instant hot cereals. Bread stuffing and biscuit mixes. Seasoned rice or pasta mixes.  Choose: Unsalted snack items. Low-sodium cereals, oats, puffed wheat and rice, shredded wheat. English muffins and bread. Pasta.  Meats  Avoid: Salted, canned, smoked, spiced, pickled meats, including fish and poultry. Bacon, ham, sausage, cold cuts, hot dogs, anchovies.  Choose: Low-sodium canned tuna and salmon. Fresh or frozen meat, poultry, and fish.  Dairy  Avoid: Processed cheese and spreads. Cottage cheese. Buttermilk and condensed milk. Regular cheese.  Choose: Milk. Low-sodium cottage cheese. Yogurt. Sour cream. Low-sodium cheese.  Fruits and Vegetables  Avoid: Regular canned vegetables. Regular canned tomato sauce and paste. Frozen vegetables in sauces. Olives. Angie Fava. Relishes. Sauerkraut.  Choose: Low-sodium canned vegetables. Low-sodium tomato sauce and paste. Frozen or fresh vegetables. Fresh and frozen fruit.  Condiments  Avoid: Canned and packaged gravies. Worcestershire sauce. Tartar sauce. Barbecue sauce. Soy sauce. Steak sauce. Ketchup. Onion, garlic, and table salt. Meat flavorings and tenderizers.  Choose: Fresh and dried herbs and spices. Low-sodium varieties of mustard and ketchup. Lemon juice. Tabasco sauce. Horseradish.    SAMPLE 1.5 GRAM SODIUM MEAL PLAN:   Breakfast  / Sodium (mg)  1 cup low-fat milk / 143 mg  1 whole-wheat English muffin / 240 mg  1 tbs heart-healthy margarine / 153 mg  1 hard-boiled egg / 139 mg  1 small orange / 0 mg  Lunch / Sodium (mg)  1 cup raw carrots / 76 mg  2 tbs no salt added peanut butter / 5 mg  2 slices whole-wheat bread / 270 mg  1 tbs jelly / 6 mg   cup red grapes / 2 mg  Dinner / Sodium (mg)  1 cup whole-wheat pasta / 2 mg  1 cup low-sodium tomato sauce / 73 mg  3 oz lean ground beef / 57 mg  1 small side salad (1 cup raw spinach leaves,  cup cucumber,  cup yellow bell pepper) with 1 tsp olive oil and 1 tsp red wine vinegar / 25 mg  Snack / Sodium (mg)  1 container  low-fat vanilla yogurt / 107 mg  3 graham cracker squares / 127 mg  Nutrient Analysis  Calories: 1683  Protein: 75 g  Carbohydrate: 237 g  Fat: 57 g  Sodium: 1425 mg  Document Released: 12/11/2005 Document Revised: 08/23/2011 Document Reviewed: 03/14/2010  ExitCare Patient Information 2012 [place], [facility].))    This information is not intended to replace advice given to you by your health care provider. Make sure you discuss any questions you have with your health care provider. Document Released: 12/14/2003 Document Revised: 06/30/2016 Document Reviewed: 05/22/2016 Elsevier Interactive Patient Education  2017 [employer].

## 2019-02-10 ENCOUNTER — Encounter: Payer: Self-pay | Admitting: Gastroenterology

## 2019-02-25 ENCOUNTER — Telehealth: Payer: Self-pay | Admitting: Family Medicine

## 2019-02-25 ENCOUNTER — Other Ambulatory Visit: Payer: Self-pay

## 2019-02-25 NOTE — Telephone Encounter (Signed)
Patient called states he needs a refill on : ( advised pt provider is out of office sick & returning tomorrow 3/4 to address his request)   tamsulosin (FLOMAX) 0.4 MG CAPS capsule [[id number]]   Order Details  Dose, Route, Frequency: As Directed   Dispense Quantity: 90 capsule Refills: 2 Fills remaining: --        Sig: TAKE 1 CAPSULE BY MOUTH ONCE DAILY     ---Forward request to medical assistant that when approved pls send refill order to:   [facility] [address], [place] - [address] [phone] (Phone) [phone] (Fax)   --glh

## 2019-02-25 NOTE — Telephone Encounter (Signed)
Sent request to Dr. Opalski to review. MPulliam, CMA/RT(R)  

## 2019-02-25 NOTE — Telephone Encounter (Signed)
Patient requesting a refill on flomax, medication was last filled by previous provider. LOV 01/03/2019. Please review and advise. MPulliam, CMA/RT(R)

## 2019-02-27 ENCOUNTER — Telehealth: Payer: Self-pay | Admitting: Family Medicine

## 2019-02-27 NOTE — Telephone Encounter (Signed)
Notified the patient - he has an appointment tomorrow with Dr. Sharee Holster.  MPulliam, CMA/RT(R)

## 2019-02-27 NOTE — Telephone Encounter (Signed)
Pt called a 2nd time checking on status of Rx refill request-- He has been out of Flomax for over a week now & really needs.  --Forwarding another request to medical assistant --  Patient is New to [place] & tried to get prior provider to honor just 1 refill but since he left that practice to join [place] (doctor will not)  Urgent message .  --glh

## 2019-02-28 ENCOUNTER — Ambulatory Visit (INDEPENDENT_AMBULATORY_CARE_PROVIDER_SITE_OTHER): Payer: Medicare Other | Admitting: Family Medicine

## 2019-02-28 ENCOUNTER — Encounter: Payer: Self-pay | Admitting: Family Medicine

## 2019-02-28 VITALS — BP 148/84 | HR 67 | Temp 98.3°F | Ht 67.0 in | Wt 219.0 lb

## 2019-02-28 DIAGNOSIS — E1169 Type 2 diabetes mellitus with other specified complication: Secondary | ICD-10-CM

## 2019-02-28 DIAGNOSIS — N401 Enlarged prostate with lower urinary tract symptoms: Secondary | ICD-10-CM | POA: Diagnosis not present

## 2019-02-28 DIAGNOSIS — N521 Erectile dysfunction due to diseases classified elsewhere: Secondary | ICD-10-CM

## 2019-02-28 DIAGNOSIS — R351 Nocturia: Secondary | ICD-10-CM

## 2019-02-28 DIAGNOSIS — E782 Mixed hyperlipidemia: Secondary | ICD-10-CM

## 2019-02-28 DIAGNOSIS — J302 Other seasonal allergic rhinitis: Secondary | ICD-10-CM | POA: Diagnosis not present

## 2019-02-28 DIAGNOSIS — I471 Supraventricular tachycardia, unspecified: Secondary | ICD-10-CM

## 2019-02-28 DIAGNOSIS — R0989 Other specified symptoms and signs involving the circulatory and respiratory systems: Secondary | ICD-10-CM | POA: Diagnosis not present

## 2019-02-28 DIAGNOSIS — R531 Weakness: Secondary | ICD-10-CM

## 2019-02-28 DIAGNOSIS — E119 Type 2 diabetes mellitus without complications: Secondary | ICD-10-CM

## 2019-02-28 DIAGNOSIS — E559 Vitamin D deficiency, unspecified: Secondary | ICD-10-CM | POA: Diagnosis not present

## 2019-02-28 DIAGNOSIS — Z794 Long term (current) use of insulin: Secondary | ICD-10-CM | POA: Diagnosis not present

## 2019-02-28 DIAGNOSIS — I10 Essential (primary) hypertension: Secondary | ICD-10-CM | POA: Diagnosis not present

## 2019-02-28 DIAGNOSIS — E1069 Type 1 diabetes mellitus with other specified complication: Secondary | ICD-10-CM

## 2019-02-28 DIAGNOSIS — E1159 Type 2 diabetes mellitus with other circulatory complications: Secondary | ICD-10-CM

## 2019-02-28 DIAGNOSIS — R5383 Other fatigue: Secondary | ICD-10-CM

## 2019-02-28 DIAGNOSIS — R413 Other amnesia: Secondary | ICD-10-CM

## 2019-02-28 DIAGNOSIS — R09A2 Foreign body sensation, throat: Secondary | ICD-10-CM

## 2019-02-28 MED ORDER — TADALAFIL 5 MG PO TABS: 5.0000 mg | ORAL_TABLET | Freq: Every day | ORAL | 3 refills | Status: DC | PRN

## 2019-02-28 MED ORDER — TAMSULOSIN HCL 0.4 MG PO CAPS: 0.4000 mg | ORAL_CAPSULE | Freq: Every day | ORAL | 1 refills | Status: DC

## 2019-02-28 MED ORDER — SEMAGLUTIDE(0.25 OR 0.5MG/DOS) 2 MG/1.5ML ~~LOC~~ SOPN: PEN_INJECTOR | SUBCUTANEOUS | 0 refills | Status: DC

## 2019-02-28 MED ORDER — AMLODIPINE BESYLATE 5 MG PO TABS: 5.0000 mg | ORAL_TABLET | Freq: Every day | ORAL | 3 refills | Status: DC

## 2019-02-28 MED ORDER — FEXOFENADINE HCL 180 MG PO TABS: 180.0000 mg | ORAL_TABLET | Freq: Every day | ORAL | 0 refills | Status: DC

## 2019-02-28 MED ORDER — TADALAFIL 5 MG PO TABS: 5.0000 mg | ORAL_TABLET | Freq: Every day | ORAL | 1 refills | Status: DC | PRN

## 2019-02-28 MED ORDER — FEXOFENADINE HCL 180 MG PO TABS: 180.0000 mg | ORAL_TABLET | Freq: Every day | ORAL | 3 refills | Status: DC

## 2019-02-28 NOTE — Patient Instructions (Signed)
Your goal blood pressure should be less than 140/90 on a regular basis, or medications should be started/ modified.    Normal blood pressure is less than 120/80.    Hypertension Hypertension, commonly called high blood pressure, is when the force of blood pumping through the arteries is too strong. The arteries are the blood vessels that carry blood from the heart throughout the body. Hypertension forces the heart to work harder to pump blood and may cause arteries to become narrow or stiff. Having untreated or uncontrolled hypertension can cause heart attacks, strokes, kidney disease, and other problems. A blood pressure reading consists of a higher number over a lower number. Ideally, your blood pressure should be below 120/80. The first ("top") number is called the systolic pressure. It is a measure of the pressure in your arteries as your heart beats. The second ("bottom") number is called the diastolic pressure. It is a measure of the pressure in your arteries as the heart relaxes. What are the causes? The cause of this condition is not known. What increases the risk? Some risk factors for high blood pressure are under your control. Others are not. Factors you can change  Smoking.  Having type 2 diabetes mellitus, high cholesterol, or both.  Not getting enough exercise or physical activity.  Being overweight.  Having too much fat, sugar, calories, or salt (sodium) in your diet.  Drinking too much alcohol. Factors that are difficult or impossible to change  Having chronic kidney disease.  Having a family history of high blood pressure.  Age. Risk increases with age.  Race. You may be at higher risk if you are African-American.  Gender. Men are at higher risk than women before age 2. After age 56, women are at higher risk than men.  Having obstructive sleep apnea.  Stress. What are the signs or symptoms? Extremely high blood pressure (hypertensive crisis) may  cause:  Headache.  Anxiety.  Shortness of breath.  Nosebleed.  Nausea and vomiting.  Severe chest pain.  Jerky movements you cannot control (seizures).  How is this diagnosed? This condition is diagnosed by measuring your blood pressure while you are seated, with your arm resting on a surface. The cuff of the blood pressure monitor will be placed directly against the skin of your upper arm at the level of your heart. It should be measured at least twice using the same arm. Certain conditions can cause a difference in blood pressure between your right and left arms. Certain factors can cause blood pressure readings to be lower or higher than normal (elevated) for a short period of time:  When your blood pressure is higher when you are in a health care provider's office than when you are at home, this is called white coat hypertension. Most people with this condition do not need medicines.  When your blood pressure is higher at home than when you are in a health care provider's office, this is called masked hypertension. Most people with this condition may need medicines to control blood pressure.  If you have a high blood pressure reading during one visit or you have normal blood pressure with other risk factors:  You may be asked to return on a different day to have your blood pressure checked again.  You may be asked to monitor your blood pressure at home for 1 week or longer.  If you are diagnosed with hypertension, you may have other blood or imaging tests to help your health care provider understand  your overall risk for other conditions. How is this treated? This condition is treated by making healthy lifestyle changes, such as eating healthy foods, exercising more, and reducing your alcohol intake. Your health care provider may prescribe medicine if lifestyle changes are not enough to get your blood pressure under control, and if:  Your systolic blood pressure is above  130.  Your diastolic blood pressure is above 80.  Your personal target blood pressure may vary depending on your medical conditions, your age, and other factors. Follow these instructions at home: Eating and drinking  Eat a diet that is high in fiber and potassium, and low in sodium, added sugar, and fat. An example eating plan is called the DASH (Dietary Approaches to Stop Hypertension) diet. To eat this way: ? Eat plenty of fresh fruits and vegetables. Try to fill half of your plate at each meal with fruits and vegetables. ? Eat whole grains, such as whole wheat pasta, brown rice, or whole grain bread. Fill about one quarter of your plate with whole grains. ? Eat or drink low-fat dairy products, such as skim milk or low-fat yogurt. ? Avoid fatty cuts of meat, processed or cured meats, and poultry with skin. Fill about one quarter of your plate with lean proteins, such as fish, chicken without skin, beans, eggs, and tofu. ? Avoid premade and processed foods. These tend to be higher in sodium, added sugar, and fat.  Reduce your daily sodium intake. Most people with hypertension should eat less than 1,500 mg of sodium a day.  Limit alcohol intake to no more than 1 drink a day for nonpregnant women and 2 drinks a day for men. One drink equals 12 oz of beer, 5 oz of wine, or 1 oz of hard liquor. Lifestyle  Work with your health care provider to maintain a healthy body weight or to lose weight. Ask what an ideal weight is for you.  Get at least 30 minutes of exercise that causes your heart to beat faster (aerobic exercise) most days of the week. Activities may include walking, swimming, or biking.  Include exercise to strengthen your muscles (resistance exercise), such as pilates or lifting weights, as part of your weekly exercise routine. Try to do these types of exercises for 30 minutes at least 3 days a week.  Do not use any products that contain nicotine or tobacco, such as cigarettes and  e-cigarettes. If you need help quitting, ask your health care provider.  Monitor your blood pressure at home as told by your health care provider.  Keep all follow-up visits as told by your health care provider. This is important. Medicines  Take over-the-counter and prescription medicines only as told by your health care provider. Follow directions carefully. Blood pressure medicines must be taken as prescribed.  Do not skip doses of blood pressure medicine. Doing this puts you at risk for problems and can make the medicine less effective.  Ask your health care provider about side effects or reactions to medicines that you should watch for. Contact a health care provider if:  You think you are having a reaction to a medicine you are taking.  You have headaches that keep coming back (recurring).  You feel dizzy.  You have swelling in your ankles.  You have trouble with your vision. Get help right away if:  You develop a severe headache or confusion.  You have unusual weakness or numbness.  You feel faint.  You have severe pain in your chest  or abdomen.  You vomit repeatedly.  You have trouble breathing. Summary  Hypertension is when the force of blood pumping through your arteries is too strong. If this condition is not controlled, it may put you at risk for serious complications.  Your personal target blood pressure may vary depending on your medical conditions, your age, and other factors. For most people, a normal blood pressure is less than 120/80.  Hypertension is treated with lifestyle changes, medicines, or a combination of both. Lifestyle changes include weight loss, eating a healthy, low-sodium diet, exercising more, and limiting alcohol. This information is not intended to replace advice given to you by your health care provider. Make sure you discuss any questions you have with your health care provider. Document Released: 12/11/2005 Document Revised: 11/08/2016  Document Reviewed: 11/08/2016 Elsevier Interactive Patient Education  2018 [employer].    How to Take Your Blood Pressure   Blood pressure is a measurement of how strongly your blood is pressing against the walls of your arteries. Arteries are blood vessels that carry blood from your heart throughout your body. Your health care provider takes your blood pressure at each office visit. You can also take your own blood pressure at home with a blood pressure machine. You may need to take your own blood pressure:  To confirm a diagnosis of high blood pressure (hypertension).  To monitor your blood pressure over time.  To make sure your blood pressure medicine is working.  Supplies needed: To take your blood pressure, you will need a blood pressure machine. You can buy a blood pressure machine, or blood pressure monitor, at most drugstores or online. There are several types of home blood pressure monitors. When choosing one, consider the following:  Choose a monitor that has an arm cuff.  Choose a monitor that wraps snugly around your upper arm. You should be able to fit only one finger between your arm and the cuff.  Do not choose a monitor that measures your blood pressure from your wrist or finger.  Your health care provider can suggest a reliable monitor that will meet your needs. How to prepare To get the most accurate reading, avoid the following for 30 minutes before you check your blood pressure:  Drinking caffeine.  Drinking alcohol.  Eating.  Smoking.  Exercising.  Five minutes before you check your blood pressure:  Empty your bladder.  Sit quietly without talking in a dining chair, rather than in a soft couch or armchair.  How to take your blood pressure To check your blood pressure, follow the instructions in the manual that came with your blood pressure monitor. If you have a digital blood pressure monitor, the instructions may be as follows: 1. Sit up  straight. 2. Place your feet on the floor. Do not cross your ankles or legs. 3. Rest your left arm at the level of your heart on a table or desk or on the arm of a chair. 4. Pull up your shirt sleeve. 5. Wrap the blood pressure cuff around the upper part of your left arm, 1 inch (2.5 cm) above your elbow. It is best to wrap the cuff around bare skin. 6. Fit the cuff snugly around your arm. You should be able to place only one finger between the cuff and your arm. 7. Position the cord inside the groove of your elbow. 8. Press the power button. 9. Sit quietly while the cuff inflates and deflates. 10. Read the digital reading on the monitor  screen and write it down (record it). 11. Wait 2-3 minutes, then repeat the steps, starting at step 1.  What does my blood pressure reading mean? A blood pressure reading consists of a higher number over a lower number. Ideally, your blood pressure should be below 120/80. The first ("top") number is called the systolic pressure. It is a measure of the pressure in your arteries as your heart beats. The second ("bottom") number is called the diastolic pressure. It is a measure of the pressure in your arteries as the heart relaxes. Blood pressure is classified into four stages. The following are the stages for adults who do not have a short-term serious illness or a chronic condition. Systolic pressure and diastolic pressure are measured in a unit called mm Hg. Normal  Systolic pressure: below 300.  Diastolic pressure: below 80. Elevated  Systolic pressure: 923-300.  Diastolic pressure: below 80. Hypertension stage 1  Systolic pressure: 762-263.  Diastolic pressure: 33-54. Hypertension stage 2  Systolic pressure: 562 or above.  Diastolic pressure: 90 or above. You can have prehypertension or hypertension even if only the systolic or only the diastolic number in your reading is higher than normal. Follow these instructions at home:  Check your blood  pressure as often as recommended by your health care provider.  Take your monitor to the next appointment with your health care provider to make sure: ? That you are using it correctly. ? That it provides accurate readings.  Be sure you understand what your goal blood pressure numbers are.  Tell your health care provider if you are having any side effects from blood pressure medicine. Contact a health care provider if:  Your blood pressure is consistently high. Get help right away if:  Your systolic blood pressure is higher than 180.  Your diastolic blood pressure is higher than 110. This information is not intended to replace advice given to you by your health care provider. Make sure you discuss any questions you have with your health care provider. Document Released: 05/19/2016 Document Revised: 08/01/2016 Document Reviewed: 05/19/2016 Elsevier Interactive Patient Education  2018 [employer].        Diabetes Mellitus and Standards of Medical Care  Managing diabetes (diabetes mellitus) can be complicated. Your diabetes treatment may be managed by a team of health care providers, including:  A diet and nutrition specialist (registered dietitian).  A nurse.  A certified diabetes educator (CDE).  A diabetes specialist (endocrinologist).  An eye doctor.  A primary care provider.  A dentist.  Your health care providers follow a schedule in order to help you get the best quality of care. The following schedule is a general guideline for your diabetes management plan. Your health care providers may also give you more specific instructions.  HbA1c (hemoglobin A1c) test This test provides information about blood sugar (glucose) control over the previous 2-3 months. It is used to check whether your diabetes management plan needs to be adjusted.  If you are meeting your treatment goals, this test is done at least 2 times a year.  If you are not meeting treatment goals or  if your treatment goals have changed, this test is done 4 times a year.  Blood pressure test  This test is done at every routine medical visit. For most people, the goal is less than 130/80. Ask your health care provider what your goal blood pressure should be.  Dental and eye exams  Visit your dentist two times a year.  If  you have type 1 diabetes, get an eye exam 3-5 years after you are diagnosed, and then once a year after your first exam. ? If you were diagnosed with type 1 diabetes as a child, get an eye exam when you are age 81 or older and have had diabetes for 3-5 years. After the first exam, you should get an eye exam once a year.  If you have type 2 diabetes, have an eye exam as soon as you are diagnosed, and then once a year after your first exam.  Foot care exam  Visual foot exams are done at every routine medical visit. The exams check for cuts, bruises, redness, blisters, sores, or other problems with the feet.  A complete foot exam is done by your health care provider once a year. This exam includes an inspection of the structure and skin of your feet, and a check of the pulses and sensation in your feet. ? Type 1 diabetes: Get your first exam 3-5 years after diagnosis. ? Type 2 diabetes: Get your first exam as soon as you are diagnosed.  Check your feet every day for cuts, bruises, redness, blisters, or sores. If you have any of these or other problems that are not healing, contact your health care provider.  Kidney function test (urine microalbumin)  This test is done once a year. ? Type 1 diabetes: Get your first test 5 years after diagnosis. ? Type 2 diabetes: Get your first test as soon as you are diagnosed._  If you have chronic kidney disease (CKD), get a serum creatinine and estimated glomerular filtration rate (eGFR) test once a year.  Lipid profile (cholesterol, HDL, LDL, triglycerides)  This test should be done when you are diagnosed with diabetes, and  every 5 years after the first test. If you are on medicines to lower your cholesterol, you may need to get this test done every year. ? The goal for LDL is less than 100 mg/dL (5.5 mmol/L). If you are at high risk, the goal is less than 70 mg/dL (3.9 mmol/L). ? The goal for HDL is 40 mg/dL (2.2 mmol/L) for men and 50 mg/dL(2.8 mmol/L) for women. An HDL cholesterol of 60 mg/dL (3.3 mmol/L) or higher gives some protection against heart disease. ? The goal for triglycerides is less than 150 mg/dL (8.3 mmol/L).  Immunizations  The yearly flu (influenza) vaccine is recommended for everyone 6 months or older who has diabetes.  The pneumonia (pneumococcal) vaccine is recommended for everyone 2 years or older who has diabetes. If you are 70 or older, you may get the pneumonia vaccine as a series of two separate shots.  The hepatitis B vaccine is recommended for adults shortly after they have been diagnosed with diabetes.  The Tdap (tetanus, diphtheria, and pertussis) vaccine should be given: ? According to normal childhood vaccination schedules, for children. ? Every 10 years, for adults who have diabetes.  The shingles vaccine is recommended for people who have had chicken pox and are 50 years or older.  Mental and emotional health  Screening for symptoms of eating disorders, anxiety, and depression is recommended at the time of diagnosis and afterward as needed. If your screening shows that you have symptoms (you have a positive screening result), you may need further evaluation and be referred to a mental health care provider.  Diabetes self-management education  Education about how to manage your diabetes is recommended at diagnosis and ongoing as needed.  Treatment plan  Your treatment  plan will be reviewed at every medical visit.  Summary  Managing diabetes (diabetes mellitus) can be complicated. Your diabetes treatment may be managed by a team of health care providers.  Your health  care providers follow a schedule in order to help you get the best quality of care.  Standards of care including having regular physical exams, blood tests, blood pressure monitoring, immunizations, screening tests, and education about how to manage your diabetes.  Your health care providers may also give you more specific instructions based on your individual health.      Type 2 Diabetes Mellitus, Self Care, Adult Caring for yourself after you have been diagnosed with type 2 diabetes (type 2 diabetes mellitus) means keeping your blood sugar (glucose) under control with a balance of:  Nutrition.  Exercise.  Lifestyle changes.  Medicines or insulin, if necessary.  Support from your team of health care providers and others.  The following information explains what you need to know to manage your diabetes at home. What do I need to do to manage my blood glucose?  Check your blood glucose every day, as often as told by your health care provider.  Contact your health care provider if your blood glucose is above your target for 2 tests in a row.  Have your A1c (hemoglobin A1c) level checked at least two times a year, or as often as told by your health care provider. Your health care provider will set individualized treatment goals for you. Generally, the goal of treatment is to maintain the following blood glucose levels:  Before meals (preprandial): 80-130 mg/dL (4.4-7.2 mmol/L).  After meals (postprandial): below 180 mg/dL (10 mmol/L).  A1c level: less than 7%.  What do I need to know about hyperglycemia and hypoglycemia? What is hyperglycemia? Hyperglycemia, also called high blood glucose, occurs when blood glucose is too high.Make sure you know the early signs of hyperglycemia, such as:  Increased thirst.  Hunger.  Feeling very tired.  Needing to urinate more often than usual.  Blurry vision.  What is hypoglycemia? Hypoglycemia, also called low blood glucose,  occurswith a blood glucose level at or below 70 mg/dL (3.9 mmol/L). The risk for hypoglycemia increases during or after exercise, during sleep, during illness, and when skipping meals or not eating for a long time (fasting). It is important to know the symptoms of hypoglycemia and treat it right away. Always have a 15-gram rapid-acting carbohydrate snack with you to treat low blood glucose. Family members and close friends should also know the symptoms and should understand how to treat hypoglycemia, in case you are not able to treat yourself. What are the symptoms of hypoglycemia? Hypoglycemia symptoms can include:  Hunger.  Anxiety.  Sweating and feeling clammy.  Confusion.  Dizziness or feeling light-headed.  Sleepiness.  Nausea.  Increased heart rate.  Headache.  Blurry vision.  Seizure.  Nightmares.  Tingling or numbness around the mouth, lips, or tongue.  A change in speech.  Decreased ability to concentrate.  A change in coordination.  Restless sleep.  Tremors or shakes.  Fainting.  Irritability.  How do I treat hypoglycemia?  If you are alert and able to swallow safely, follow the 15:15 rule:  Take 15 grams of a rapid-acting carbohydrate. Rapid-acting options include: ? 1 tube of glucose gel. ? 3 glucose pills. ? 6-8 pieces of hard candy. ? 4 oz (120 mL) of fruit juice. ? 4 oz (120 mL) of regular (not diet) soda.  Check your blood glucose 15  minutes after you take the carbohydrate.  If the repeat blood glucose level is still at or below 70 mg/dL (3.9 mmol/L), take 15 grams of a carbohydrate again.  If your blood glucose level does not increase above 70 mg/dL (3.9 mmol/L) after 3 tries, seek emergency medical care.  After your blood glucose level returns to normal, eat a meal or a snack within 1 hour.  How do I treat severe hypoglycemia? Severe hypoglycemia is when your blood glucose level is at or below 54 mg/dL (3 mmol/L). Severe hypoglycemia  is an emergency. Do not wait to see if the symptoms will go away. Get medical help right away. Call your local emergency services (911 in the [place] Do not drive yourself to the hospital. If you have severe hypoglycemia and you cannot eat or drink, you may need an injection of glucagon. A family member or close friend should learn how to check your blood glucose and how to give you a glucagon injection. Ask your health care provider if you need to have an emergency glucagon injection kit available. Severe hypoglycemia may need to be treated in a hospital. The treatment may include getting glucose through an IV tube. You may also need treatment for the cause of your hypoglycemia. Can having diabetes put me at risk for other conditions? Having diabetes can put you at risk for other long-term (chronic) conditions, such as heart disease and kidney disease. Your health care provider may prescribe medicines to help prevent complications from diabetes. These medicines may include:  Aspirin.  Medicine to lower cholesterol.  Medicine to control blood pressure.  What else can I do to manage my diabetes? Take your diabetes medicines as told  If your health care provider prescribed insulin or diabetes medicines, take them every day.  Do not run out of insulin or other diabetes medicines that you take. Plan ahead so you always have these available.  If you use insulin, adjust your dosage based on how physically active you are and what foods you eat. Your health care provider will tell you how to adjust your dosage. Make healthy food choices  The things that you eat and drink affect your blood glucose and your insulin dosage. Making good choices helps to control your diabetes and prevent other health problems. A healthy meal plan includes eating lean proteins, complex carbohydrates, fresh fruits and vegetables, low-fat dairy products, and healthy fats. Make an appointment to see a diet and nutrition  specialist (registered dietitian) to help you create an eating plan that is right for you. Make sure that you:  Follow instructions from your health care provider about eating or drinking restrictions.  Drink enough fluid to keep your urine clear or pale yellow.  Eat healthy snacks between nutritious meals.  Track the carbohydrates that you eat. Do this by reading food labels and learning the standard serving sizes of foods.  Follow your sick day plan whenever you cannot eat or drink as usual. Make this plan in advance with your health care provider.  Stay active  Exercise regularly, as told by your health care provider. This may include:  Stretching and doing strength exercises, such as yoga or weightlifting, at least 2 times a week.  Doing at least 150 minutes of moderate-intensity or vigorous-intensity exercise each week. This could be brisk walking, biking, or water aerobics. ? Spread out your activity over at least 3 days of the week. ? Do not go more than 2 days in a row without doing  some kind of physical activity.  When you start a new exercise or activity, work with your health care provider to adjust your insulin, medicines, or food intake as needed. Make healthy lifestyle choices  Do not use any tobacco products, such as cigarettes, chewing tobacco, and e-cigarettes. If you need help quitting, ask your health care provider.  If your health care provider says that alcohol is safe for you, limit alcohol intake to no more than 1 drink per day for nonpregnant women and 2 drinks per day for men. One drink equals 12 oz of beer, 5 oz of wine, or 1 oz of hard liquor.  Learn to manage stress. If you need help with this, ask your health care provider. Care for your body   Keep your immunizations up to date. In addition to getting vaccinations as told by your health care provider, it is recommended that you get vaccinated against the following illnesses: ? The flu (influenza). Get  a flu shot every year. ? Pneumonia. ? Hepatitis B.  Schedule an eye exam soon after your diagnosis, and then one time every year after that.  Check your skin and feet every day for cuts, bruises, redness, blisters, or sores. Schedule a foot exam with your health care provider once every year.  Brush your teeth and gums two times a day, and floss at least one time a day. Visit your dentist at least once every 6 months.  Maintain a healthy weight. General instructions  Take over-the-counter and prescription medicines only as told by your health care provider.  Share your diabetes management plan with people in your workplace, school, and household.  Check your urine for ketones when you are ill and as told by your health care provider.  Ask your health care provider: ? Do I need to meet with a diabetes educator? ? Where can I find a support group for people with diabetes?  Carry a medical alert card or wear medical alert jewelry.  Keep all follow-up visits as told by your health care provider. This is important. Where to find more information: For more information about diabetes, visit:  American Diabetes Association (ADA): www.diabetes.org  American Association of Diabetes Educators (AADE): www.diabeteseducator.org/patient-resources  This information is not intended to replace advice given to you by your health care provider. Make sure you discuss any questions you have with your health care provider. Document Released: 04/03/2016 Document Revised: 05/18/2016 Document Reviewed: 01/14/2016 Elsevier Interactive Patient Education  2017 [employer].      Blood Glucose Monitoring, Adult Monitoring your blood sugar (glucose) helps you manage your diabetes. It also helps you and your health care provider determine how well your diabetes management plan is working. Blood glucose monitoring involves checking your blood glucose as often as directed, and keeping a record (log) of your  results over time. Why should I monitor my blood glucose? Checking your blood glucose regularly can:  Help you understand how food, exercise, illnesses, and medicines affect your blood glucose.  Let you know what your blood glucose is at any time. You can quickly tell if you are having low blood glucose (hypoglycemia) or high blood glucose (hyperglycemia).  Help you and your health care provider adjust your medicines as needed.  When should I check my blood glucose? Follow instructions from your health care provider about how often to check your blood glucose.   This may depend on:  The type of diabetes you have.  How well-controlled your diabetes is.  Medicines you  are taking.  If you have type 1 diabetes:  Check your blood glucose at least 2 times a day.  Also check your blood glucose: ? Before every insulin injection. ? Before and after exercise. ? Between meals. ? 2 hours after a meal. ? Occasionally between 2:00 a.m. and 3:00 a.m., as directed. ? Before potentially dangerous tasks, like driving or using heavy machinery. ? At bedtime.  You may need to check your blood glucose more often, up to 6-10 times a day: ? If you use an insulin pump. ? If you need multiple daily injections (MDI). ? If your diabetes is not well-controlled. ? If you are ill. ? If you have a history of severe hypoglycemia. ? If you have a history of not knowing when your blood glucose is getting low (hypoglycemia unawareness).  If you have type 2 diabetes:  If you take insulin or other diabetes medicines, check your blood glucose at least 2 times a day.  If you are on intensive insulin therapy, check your blood glucose at least 4 times a day. Occasionally, you may also need to check between 2:00 a.m. and 3:00 a.m., as directed.  Also check your blood glucose: ? Before and after exercise. ? Before potentially dangerous tasks, like driving or using heavy machinery.  You may need to check your  blood glucose more often if: ? Your medicine is being adjusted. ? Your diabetes is not well-controlled. ? You are ill.  What is a blood glucose log?  A blood glucose log is a record of your blood glucose readings. It helps you and your health care provider: ? Look for patterns in your blood glucose over time. ? Adjust your diabetes management plan as needed.  Every time you check your blood glucose, write down your result and notes about things that may be affecting your blood glucose, such as your diet and exercise for the day.  Most glucose meters store a record of glucose readings in the meter. Some meters allow you to download your records to a computer. How do I check my blood glucose? Follow these steps to get accurate readings of your blood glucose: Supplies needed   Blood glucose meter.  Test strips for your meter. Each meter has its own strips. You must use the strips that come with your meter.  A needle to prick your finger (lancet). Do not use lancets more than once.  A device that holds the lancet (lancing device).  A journal or log book to write down your results.  Procedure  Wash your hands with soap and water.  Prick the side of your finger (not the tip) with the lancet. Use a different finger each time.  Gently rub the finger until a small drop of blood appears.  Follow instructions that come with your meter for inserting the test strip, applying blood to the strip, and using your blood glucose meter.  Write down your result and any notes.  Alternative testing sites  Some meters allow you to use areas of your body other than your finger (alternative sites) to test your blood.  If you think you may have hypoglycemia, or if you have hypoglycemia unawareness, do not use alternative sites. Use your finger instead.  Alternative sites may not be as accurate as the fingers, because blood flow is slower in these areas. This means that the result you get may be  delayed, and it may be different from the result that you would get from your finger.  The most common alternative sites are: ? Forearm. ? Thigh. ? Palm of the hand.  Additional tips  Always keep your supplies with you.  If you have questions or need help, all blood glucose meters have a 24-hour "hotline" number that you can call. You may also contact your health care provider.  After you use a few boxes of test strips, adjust (calibrate) your blood glucose meter by following instructions that came with your meter.    The American Diabetes Association suggests the following targets for most nonpregnant adults with diabetes.  More or less stringent glycemic goals may be appropriate for each individual.  A1C: Less than 7% A1C may also be reported as eAG: Less than 154 mg/dl Before a meal (preprandial plasma glucose): 80-130 mg/dl 1-2 hours after beginning of the meal (Postprandial plasma glucose)*: Less than 180 mg/dl  *Postprandial glucose may be targeted if A1C goals are not met despite reaching preprandial glucose goals.   GOALS in short:  The goals are for the Hgb A1C to be less than 7.0 & blood pressure to be less than 130/80.    It is recommended that all diabetics are educated on and follow a healthy diabetic diet, exercise for 30 minutes 3-4 times per week (walking, biking, swimming, or machine), monitor blood glucose readings and bring that record with you to be reviewed at your next office visit.     You should be checking fasting blood sugars- especially after you eat poorly or eat really healthy, and also check 2 hour postprandial blood sugars after largest meal of the day.    Write these down and bring in your log at each office visit.    You will need to be seen every 3 months by the provider managing your Diabetes unless told otherwise by that provider.   You will need yearly eye exams from an eye specialist and foot exams to check the nerves of your feet.  Also, your  urine should be checked yearly as well to make sure excess protein is not present.   If you are checking your blood pressure at home, please record it and bring it to your next office visit.    Follow the Dietary Approaches to Stop Hypertension (DASH) diet (3 servings of fruit and vegetables daily, whole grains, low sodium, low-fat proteins).  See below.    Lastly, when it comes to your cholesterol, the goal is to have the HDL (good cholesterol) >40, and the LDL (bad cholesterol) <100.   It is recommended that you follow a heart healthy, low saturated and trans-fat diet and exercise for 30 minutes at least 5 times a week.     (( Check out the DASH diet = 1.5 Gram Low Sodium Diet   A 1.5 gram sodium diet restricts the amount of sodium in the diet to no more than 1.5 g or 1500 mg daily.  The American Heart Association recommends Americans over the age of 45 to consume no more than 1500 mg of sodium each day to reduce the risk of developing high blood pressure.  Research also shows that limiting sodium may reduce heart attack and stroke risk.  Many foods contain sodium for flavor and sometimes as a preservative.  When the amount of sodium in a diet needs to be low, it is important to know what to look for when choosing foods and drinks.  The following includes some information and guidelines to help make it easier for you to adapt to a  low sodium diet.    QUICK TIPS  Do not add salt to food.  Avoid convenience items and fast food.  Choose unsalted snack foods.  Buy lower sodium products, often labeled as "lower sodium" or "no salt added."  Check food labels to learn how much sodium is in 1 serving.  When eating at a restaurant, ask that your food be prepared with less salt or none, if possible.    READING FOOD LABELS FOR SODIUM INFORMATION  The nutrition facts label is a good place to find how much sodium is in foods. Look for products with no more than 400 mg of sodium per serving.  Remember  that 1.5 g = 1500 mg.  The food label may also list foods as:  Sodium-free: Less than 5 mg in a serving.  Very low sodium: 35 mg or less in a serving.  Low-sodium: 140 mg or less in a serving.  Light in sodium: 50% less sodium in a serving. For example, if a food that usually has 300 mg of sodium is changed to become light in sodium, it will have 150 mg of sodium.  Reduced sodium: 25% less sodium in a serving. For example, if a food that usually has 400 mg of sodium is changed to reduced sodium, it will have 300 mg of sodium.    CHOOSING FOODS  Grains  Avoid: Salted crackers and snack items. Some cereals, including instant hot cereals. Bread stuffing and biscuit mixes. Seasoned rice or pasta mixes.  Choose: Unsalted snack items. Low-sodium cereals, oats, puffed wheat and rice, shredded wheat. English muffins and bread. Pasta.  Meats  Avoid: Salted, canned, smoked, spiced, pickled meats, including fish and poultry. Bacon, ham, sausage, cold cuts, hot dogs, anchovies.  Choose: Low-sodium canned tuna and salmon. Fresh or frozen meat, poultry, and fish.  Dairy  Avoid: Processed cheese and spreads. Cottage cheese. Buttermilk and condensed milk. Regular cheese.  Choose: Milk. Low-sodium cottage cheese. Yogurt. Sour cream. Low-sodium cheese.  Fruits and Vegetables  Avoid: Regular canned vegetables. Regular canned tomato sauce and paste. Frozen vegetables in sauces. Olives. Angie Fava. Relishes. Sauerkraut.  Choose: Low-sodium canned vegetables. Low-sodium tomato sauce and paste. Frozen or fresh vegetables. Fresh and frozen fruit.  Condiments  Avoid: Canned and packaged gravies. Worcestershire sauce. Tartar sauce. Barbecue sauce. Soy sauce. Steak sauce. Ketchup. Onion, garlic, and table salt. Meat flavorings and tenderizers.  Choose: Fresh and dried herbs and spices. Low-sodium varieties of mustard and ketchup. Lemon juice. Tabasco sauce. Horseradish.    SAMPLE 1.5 GRAM SODIUM MEAL PLAN:   Breakfast  / Sodium (mg)  1 cup low-fat milk / 143 mg  1 whole-wheat English muffin / 240 mg  1 tbs heart-healthy margarine / 153 mg  1 hard-boiled egg / 139 mg  1 small orange / 0 mg  Lunch / Sodium (mg)  1 cup raw carrots / 76 mg  2 tbs no salt added peanut butter / 5 mg  2 slices whole-wheat bread / 270 mg  1 tbs jelly / 6 mg   cup red grapes / 2 mg  Dinner / Sodium (mg)  1 cup whole-wheat pasta / 2 mg  1 cup low-sodium tomato sauce / 73 mg  3 oz lean ground beef / 57 mg  1 small side salad (1 cup raw spinach leaves,  cup cucumber,  cup yellow bell pepper) with 1 tsp olive oil and 1 tsp red wine vinegar / 25 mg  Snack / Sodium (mg)  1 container  low-fat vanilla yogurt / 107 mg  3 graham cracker squares / 127 mg  Nutrient Analysis  Calories: 1683  Protein: 75 g  Carbohydrate: 237 g  Fat: 57 g  Sodium: 1425 mg  Document Released: 12/11/2005 Document Revised: 08/23/2011 Document Reviewed: 03/14/2010  ExitCare Patient Information 2012 [place], [facility].))    This information is not intended to replace advice given to you by your health care provider. Make sure you discuss any questions you have with your health care provider. Document Released: 12/14/2003 Document Revised: 06/30/2016 Document Reviewed: 05/22/2016 Elsevier Interactive Patient Education  2017 [employer].

## 2019-02-28 NOTE — Progress Notes (Signed)
Impression and Recommendations:    1. Hypertension associated with diabetes (HCC)   2. Type 2 diabetes mellitus without complication, with long-term current use of insulin (HCC)   3. 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 Globus pharyngeus   5. Morbid obesity (HCC)   6. Type 2 diabetes mellitus with other specified complication, with long-term current use of insulin (HCC)   7. BPH associated with nocturia   8. Seasonal allergies   9. Paroxysmal supraventricular tachycardia (HCC)   10. Erectile dysfunction due to diseases classified elsewhere   11. Vitamin D deficiency   12. Weakness   13. Fatigue, unspecified type   14. Memory difficulties     1. Elevated Blood Pressure:  -Blood pressure in the office today at 145/75. Discussed that the blood pressure goal would be under 140/90 at this time. -Patient is only on losartan at this time.  -Recommended that we add amlodipine to aid in decreasing the blood pressure, to which the patient agrees.  -Will add amlodipine and have the patient follow up in 6 weeks.   2. DM:  -Patient is only on Novolin R at this time.  -will start the patient on Ozempic and discussed with the patient that he may feel slightly nauseous when he first starts taking it.  -Patient will follow up in 6 weeks.   3. Prior Tachycardia episodes:  Pt asymptomatic today. Discussed with the patient that if he ever has tachycardia with CP, SOB, or chest tightness, to call 911. Discussed with the patient regarding valsalva maneuver when the patient has tachycardia.  -Recommended carbonated water and for the patient to discontinue use of caffeinated beverages.  -Recommended that the patient discontinue use of afrin nasal spray and any decongestants.  -Advised the patient to began using AYR or Neilmed sinus rinses BID.   4. BPH:  -Discussed with the patient that I will refill his Flomax today, 90 day supply with refill.    -Will check PSA level today and will check DRE on next visit.   5. Erectile Dysfunction:  -Patient has tried Viagra in the past with no relief.  -Discussed penile injections that can be administered by an Urologist, to which the patient would like to explore his options.   -Will prescribe Cialis at lower dose and patient will report back if he would like a prescription of this.  -Patient to follow up in 6 weeks for management of this.   6. Obesity:  -Encouraged to speed walk at least 45 minutes daily.  - Healthy dietary habits encouraged, including low-carb, and high amounts of lean protein in diet.  - Patient should also consume adequate amounts of water - half of body weight in oz of water per day    Patient to follow up next week for wellness visit. Patient to follow up in 6 weeks for review of new medications. a    Orders Placed This Encounter  Procedures  . CBC with Differential/Platelet  . Comprehensive metabolic panel  . Hemoglobin A1c  . Lipid panel  . TSH  . T4, free  . VITAMIN D 25 Hydroxy (Vit-D Deficiency, Fractures)  . B12  . PSA, total and free    Meds ordered this encounter  Medications  . tamsulosin (FLOMAX) 0.4 MG CAPS capsule    Sig: Take 1 capsule (0.4 mg total) by mouth daily.    Dispense:  90 capsule    Refill:  1  .  DISCONTD: fexofenadine (ALLEGRA) 180 MG tablet    Sig: Take 1 tablet (180 mg total) by mouth daily.    Dispense:  90 tablet    Refill:  3  . DISCONTD: tadalafil (CIALIS) 5 MG tablet    Sig: Take 1 tablet (5 mg total) by mouth daily as needed for erectile dysfunction.    Dispense:  90 tablet    Refill:  3  . amLODipine (NORVASC) 5 MG tablet    Sig: Take 1 tablet (5 mg total) by mouth daily.    Dispense:  90 tablet    Refill:  3  . Semaglutide,0.25 or 0.5MG /DOS, (OZEMPIC, 0.25 OR 0.5 MG/DOSE,) 2 MG/1.5ML SOPN    Sig: Inject 0.5 mg into the skin once a week for 30 days, THEN 1 mg once a week for 30 days.    Dispense:  5 pen     Refill:  0  . tadalafil (CIALIS) 5 MG tablet    Sig: Take 1 tablet (5 mg total) by mouth daily as needed for erectile dysfunction.    Dispense:  10 tablet    Refill:  1  . fexofenadine (ALLEGRA) 180 MG tablet    Sig: Take 1 tablet (180 mg total) by mouth daily.    Dispense:  90 tablet    Refill:  0    Medications Discontinued During This Encounter  Medication Reason  . tamsulosin (FLOMAX) 0.4 MG CAPS capsule Reorder  . tadalafil (CIALIS) 5 MG tablet Reorder  . fexofenadine (ALLEGRA) 180 MG tablet Reorder     Gross side effects, risk and benefits, and alternatives of medications and treatment plan in general discussed with patient.  Patient is aware that all medications have potential side effects and we are unable to predict every side effect or drug-drug interaction that may occur.   Patient will call with any questions prior to using medication if they have concerns.    Expresses verbal understanding and consents to current therapy and treatment regimen.  No barriers to understanding were identified.  Red flag symptoms and signs discussed in detail.  Patient expressed understanding regarding what to do in case of emergency\urgent symptoms  Please see AVS handed out to patient at the end of our visit for further patient instructions/ counseling done pertaining to today's office visit.   Return for 2) follow-up 6 weeks started amlodipine, Ozempic, Cialis-bring blood pressure and blood sugar logs.     Note:  This note was prepared with assistance of Dragon voice recognition software. Occasional wrong-word or sound-a-like substitutions may have occurred due to the inherent limitations of voice recognition software.   This document serves as a record of services personally performed by Thomasene Lot, DO. It was created on her behalf by Chestine Spore, a trained medical scribe. The creation of this record is based on the scribe's personal observations and the provider's statements to them.    I have reviewed the above medical documentation for accuracy and completeness and I concur.  Thomasene Lot, DO 03/02/2019 9:02 PM       --------------------------------------------------------------------------------------------------------------------------------------------------------------------------------------------------------------------------------------------    Subjective:     HPI: Antonio Patterson is a 68 y.o. male who presents to [facility] today for issues as discussed below.   BPH:  He has been on the flomax for enlarged prostate and difficulty with the stream. He has been on flomax for several years. Dr. Everardo All has been managing it with yearly PSA and DRE. When he is on the  flomax, he isn't have much nocturia.   Elevated blood pressure:  He notes that at home his blood pressure has been running high systolic and diastolic. He has has tachycardia lasting for a couple minutes up to 190 (3 episodes). He reports that it felt as if there was a lump in his throat when he had the episodes of tachycardia. He denies SOB, dizziness, diaphoresis, chest tightness, vision change, numbness, tingling, and any other symptoms. He doesn't drink coffee, however, he drinks diet sodas, caffeine free (up to or more than a 2 liter daily).   Erectile Dysfunction:  He reports that he has tried viagra in the past with no relief of his ED. He hasn't had sex lately due to his son passing in 2009 and his wife feeling depressed.   DM:  He goes for his yearly eye and foot exams. His blood sugars have been "good". He takes his medication after he eats instead of before he eats and then sometimes forgets to take it. He has tried the longer acting insulin, however, it was harder for him to keep track of it.       Wt Readings from Last 3 Encounters:  02/28/19 219 lb (99.3 kg)  01/03/19 218 lb 6.4 oz (99.1 kg)  08/09/18 209 lb (94.8 kg)   BP Readings from Last 3  Encounters:  02/28/19 (!) 148/84  01/03/19 (!) 143/81  08/09/18 124/84   Pulse Readings from Last 3 Encounters:  02/28/19 67  01/03/19 89  08/09/18 72   BMI Readings from Last 3 Encounters:  02/28/19 34.30 kg/m  01/03/19 34.21 kg/m  08/09/18 32.73 kg/m     Patient Care Team    Relationship Specialty Notifications Start End  Thomasene Lot, DO PCP - General Family Medicine  01/02/19   Romero Belling, MD Consulting Physician Endocrinology  01/03/19      Patient Active Problem List   Diagnosis Date Noted  . Morbid obesity (HCC) 01/03/2019    Priority: High  . Mixed diabetic hyperlipidemia associated with type 2 diabetes mellitus (HCC) 01/03/2019    Priority: High  . Hypertension associated with diabetes (HCC) 01/03/2019    Priority: High  . Diabetes (HCC) 08/10/2007    Priority: High  . Gastroesophageal reflux disease without esophagitis 01/05/2017    Priority: Medium  . ALCOHOL USE 11/13/2008    Priority: Medium  . BLADDER OUTLET OBSTRUCTION 11/13/2008    Priority: Medium  . 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 Hand pain 08/24/2016    Priority: Low  . ERECTILE DYSFUNCTION 11/13/2008    Priority: Low  . ALLERGIC RHINITIS 08/10/2007    Priority: Low  . Chronic pansinusitis 04/06/2017  . Nasal turbinate hypertrophy 04/06/2017  . Cough 12/08/2016  . Wellness examination 07/09/2015  . Headache(784.0) 10/31/2013  . Screening for prostate cancer 03/28/2013  . ABNORMAL ELECTROCARDIOGRAM 10/21/2010  . 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 UNSPECIFIED PERIPHERAL VASCULAR DISEASE 11/13/2008  . SEBORRHEA 11/13/2008  . Dyslipidemia 08/10/2007  . Essential hypertension 08/10/2007    Past Medical history, Surgical history, Family history, Social history, Allergies and Medications have been entered into the medical record, reviewed and changed as needed.    Current Meds  Medication Sig  . aspirin 81 MG tablet Take 81 mg by mouth daily.    .  fluticasone (FLONASE) 50 MCG/ACT nasal spray Place 2 sprays into the nose daily.  Marland Kitchen glucose blood (RELION GLUCOSE TEST STRIPS) test strip Use as instructed   . insulin  regular (NOVOLIN R,HUMULIN R) 100 units/mL injection Inject 0.6 mLs (60 Units total) into the skin 2 (two) times daily before a meal. And syringes 2/day  . loratadine-pseudoephedrine (LORATADINE-D 24HR) 10-240 MG 24 hr tablet Take 1 tablet by mouth daily.  Marland Kitchen losartan-hydrochlorothiazide (HYZAAR) 100-12.5 MG tablet TAKE 1 TABLET BY MOUTH ONCE DAILY  . omeprazole (PRILOSEC) 40 MG capsule Take 1 capsule (40 mg total) by mouth daily.  . simvastatin (ZOCOR) 40 MG tablet TAKE 1 TABLET BY MOUTH ONCE DAILY IN THE EVENING  . tamsulosin (FLOMAX) 0.4 MG CAPS capsule Take 1 capsule (0.4 mg total) by mouth daily.  . traMADol (ULTRAM) 50 MG tablet TAKE 1 TABLET BY MOUTH EVERY 8 HOURS AS NEEDED  . [DISCONTINUED] tamsulosin (FLOMAX) 0.4 MG CAPS capsule TAKE 1 CAPSULE BY MOUTH ONCE DAILY    Allergies:  Allergies  Allergen Reactions  . Metformin   . Rosuvastatin   . Sulfonamide Derivatives      Review of Systems:  A fourteen system review of systems was performed and found to be positive as per HPI.   Objective:   Blood pressure (!) 148/84, pulse 67, temperature 98.3 F (36.8 C), height 5\' 7"  (1.702 m), weight 219 lb (99.3 kg), SpO2 94 %. Body mass index is 34.3 kg/m. General:  Well Developed, well nourished, appropriate for stated age.  Neuro:  Alert and oriented,  extra-ocular muscles intact  HEENT:  Normocephalic, atraumatic, neck supple, no carotid bruits appreciated  Skin:  no gross rash, warm, pink. Cardiac:  RRR, S1 S2 Respiratory:  ECTA B/L and A/P, Not using accessory muscles, speaking in full sentences- unlabored. Vascular:  Ext warm, no cyanosis apprec.; cap RF less 2 sec. Psych:  No HI/SI, judgement and insight good, Euthymic mood. Full Affect.

## 2019-03-01 LAB — LIPID PANEL
Chol/HDL Ratio: 2.7 ratio (ref 0.0–5.0)
Cholesterol, Total: 140 mg/dL (ref 100–199)
HDL: 51 mg/dL (ref >39)
LDL Calculated: 79 mg/dL (ref 0–99)
Triglycerides: 51 mg/dL (ref 0–149)
VLDL Cholesterol Cal: 10 mg/dL (ref 5–40)

## 2019-03-01 LAB — CBC WITH DIFFERENTIAL/PLATELET
BASOS ABS: 0.1 10*3/uL (ref 0.0–0.2)
Basos: 1 %
EOS (ABSOLUTE): 0.3 10*3/uL (ref 0.0–0.4)
Eos: 4 %
Hematocrit: 44 % (ref 37.5–51.0)
Hemoglobin: 14.6 g/dL (ref 13.0–17.7)
IMMATURE GRANS (ABS): 0 10*3/uL (ref 0.0–0.1)
Immature Granulocytes: 0 %
LYMPHS: 28 %
Lymphocytes Absolute: 2.5 10*3/uL (ref 0.7–3.1)
MCH: 28.3 pg (ref 26.6–33.0)
MCHC: 33.2 g/dL (ref 31.5–35.7)
MCV: 85 fL (ref 79–97)
Monocytes Absolute: 0.6 10*3/uL (ref 0.1–0.9)
Monocytes: 7 %
NEUTROS PCT: 60 %
Neutrophils Absolute: 5.2 10*3/uL (ref 1.4–7.0)
PLATELETS: 218 10*3/uL (ref 150–450)
RBC: 5.15 x10E6/uL (ref 4.14–5.80)
RDW: 13.1 % (ref 11.6–15.4)
WBC: 8.7 10*3/uL (ref 3.4–10.8)

## 2019-03-01 LAB — COMPREHENSIVE METABOLIC PANEL
ALK PHOS: 70 IU/L (ref 39–117)
ALT: 31 IU/L (ref 0–44)
AST: 26 IU/L (ref 0–40)
Albumin/Globulin Ratio: 1.9 (ref 1.2–2.2)
Albumin: 4.8 g/dL (ref 3.8–4.8)
BILIRUBIN TOTAL: 0.9 mg/dL (ref 0.0–1.2)
BUN/Creatinine Ratio: 12 (ref 10–24)
BUN: 12 mg/dL (ref 8–27)
CHLORIDE: 104 mmol/L (ref 96–106)
CO2: 24 mmol/L (ref 20–29)
Calcium: 9.7 mg/dL (ref 8.6–10.2)
Creatinine, Ser: 0.97 mg/dL (ref 0.76–1.27)
GFR calc non Af Amer: 80 mL/min/{1.73_m2} (ref >59)
GFR, EST AFRICAN AMERICAN: 92 mL/min/{1.73_m2} (ref >59)
GLUCOSE: 131 mg/dL — AB (ref 65–99)
Globulin, Total: 2.5 g/dL (ref 1.5–4.5)
Potassium: 4.5 mmol/L (ref 3.5–5.2)
Sodium: 142 mmol/L (ref 134–144)
TOTAL PROTEIN: 7.3 g/dL (ref 6.0–8.5)

## 2019-03-01 LAB — PSA, TOTAL AND FREE
PSA FREE: 0.68 ng/mL
PSA, Free Pct: 26.2 %
Prostate Specific Ag, Serum: 2.6 ng/mL (ref 0.0–4.0)

## 2019-03-01 LAB — VITAMIN D 25 HYDROXY (VIT D DEFICIENCY, FRACTURES): Vit D, 25-Hydroxy: 10.6 ng/mL — ABNORMAL LOW (ref 30.0–100.0)

## 2019-03-01 LAB — TSH: TSH: 1.63 u[IU]/mL (ref 0.450–4.500)

## 2019-03-01 LAB — VITAMIN B12: Vitamin B-12: 456 pg/mL (ref 232–1245)

## 2019-03-01 LAB — HEMOGLOBIN A1C
Est. average glucose Bld gHb Est-mCnc: 171 mg/dL
HEMOGLOBIN A1C: 7.6 % — AB (ref 4.8–5.6)

## 2019-03-01 LAB — T4, FREE: Free T4: 1.19 ng/dL (ref 0.82–1.77)

## 2019-03-07 ENCOUNTER — Ambulatory Visit (INDEPENDENT_AMBULATORY_CARE_PROVIDER_SITE_OTHER): Payer: Medicare Other | Admitting: Family Medicine

## 2019-03-07 ENCOUNTER — Telehealth: Payer: Self-pay | Admitting: Family Medicine

## 2019-03-07 ENCOUNTER — Encounter: Payer: Self-pay | Admitting: Family Medicine

## 2019-03-07 ENCOUNTER — Other Ambulatory Visit: Payer: Self-pay

## 2019-03-07 VITALS — BP 108/69 | HR 85 | Temp 98.3°F | Ht 65.0 in | Wt 219.2 lb

## 2019-03-07 DIAGNOSIS — E1159 Type 2 diabetes mellitus with other circulatory complications: Secondary | ICD-10-CM

## 2019-03-07 DIAGNOSIS — I152 Hypertension secondary to endocrine disorders: Secondary | ICD-10-CM

## 2019-03-07 DIAGNOSIS — E119 Type 2 diabetes mellitus without complications: Secondary | ICD-10-CM | POA: Diagnosis not present

## 2019-03-07 DIAGNOSIS — J302 Other seasonal allergic rhinitis: Secondary | ICD-10-CM | POA: Insufficient documentation

## 2019-03-07 DIAGNOSIS — R531 Weakness: Secondary | ICD-10-CM | POA: Insufficient documentation

## 2019-03-07 DIAGNOSIS — N401 Enlarged prostate with lower urinary tract symptoms: Secondary | ICD-10-CM | POA: Diagnosis not present

## 2019-03-07 DIAGNOSIS — Z794 Long term (current) use of insulin: Secondary | ICD-10-CM | POA: Diagnosis not present

## 2019-03-07 DIAGNOSIS — I10 Essential (primary) hypertension: Secondary | ICD-10-CM

## 2019-03-07 DIAGNOSIS — E559 Vitamin D deficiency, unspecified: Secondary | ICD-10-CM | POA: Insufficient documentation

## 2019-03-07 DIAGNOSIS — E782 Mixed hyperlipidemia: Secondary | ICD-10-CM | POA: Diagnosis not present

## 2019-03-07 DIAGNOSIS — E1069 Type 1 diabetes mellitus with other specified complication: Secondary | ICD-10-CM | POA: Insufficient documentation

## 2019-03-07 DIAGNOSIS — R351 Nocturia: Secondary | ICD-10-CM | POA: Diagnosis not present

## 2019-03-07 MED ORDER — LIRAGLUTIDE 18 MG/3ML ~~LOC~~ SOPN: PEN_INJECTOR | SUBCUTANEOUS | 3 refills | Status: DC

## 2019-03-07 MED ORDER — VITAMIN D (ERGOCALCIFEROL) 1.25 MG (50000 UNIT) PO CAPS: ORAL_CAPSULE | ORAL | 3 refills | Status: DC

## 2019-03-07 MED ORDER — EMPAGLIFLOZIN 10 MG PO TABS: 10.0000 mg | ORAL_TABLET | Freq: Every day | ORAL | 3 refills | Status: DC

## 2019-03-07 NOTE — Patient Instructions (Addendum)
Today we are starting you on Victoza which is an injectable daily for your diabetes.  We are starting this because the Ozempic was too expensive.  Again you can try the good Rx card and see if you can get it cheaper. Additionally, we are starting you on Jardiance for the diabetes as well.  This is a pill you take every day. -Please be very vigilant with your blood sugars as you are insulin needs will go down and hopefully we can take you off of them.  - added vit D tabs once wkly    Diabetes Mellitus and Standards of Medical Care  Managing diabetes (diabetes mellitus) can be complicated. Your diabetes treatment may be managed by a team of health care providers, including:  A diet and nutrition specialist (registered dietitian).  A nurse.  A certified diabetes educator (CDE).  A diabetes specialist (endocrinologist).  An eye doctor.  A primary care provider.  A dentist.  Your health care providers follow a schedule in order to help you get the best quality of care. The following schedule is a general guideline for your diabetes management plan. Your health care providers may also give you more specific instructions.  HbA1c (hemoglobin A1c) test This test provides information about blood sugar (glucose) control over the previous 2-3 months. It is used to check whether your diabetes management plan needs to be adjusted.  If you are meeting your treatment goals, this test is done at least 2 times a year.  If you are not meeting treatment goals or if your treatment goals have changed, this test is done 4 times a year.  Blood pressure test  This test is done at every routine medical visit. For most people, the goal is less than 130/80. Ask your health care provider what your goal blood pressure should be.  Dental and eye exams  Visit your dentist two times a year.  If you have type 1 diabetes, get an eye exam 3-5 years after you are diagnosed, and then once a year after your  first exam. ? If you were diagnosed with type 1 diabetes as a child, get an eye exam when you are age 55 or older and have had diabetes for 3-5 years. After the first exam, you should get an eye exam once a year.  If you have type 2 diabetes, have an eye exam as soon as you are diagnosed, and then once a year after your first exam.  Foot care exam  Visual foot exams are done at every routine medical visit. The exams check for cuts, bruises, redness, blisters, sores, or other problems with the feet.  A complete foot exam is done by your health care provider once a year. This exam includes an inspection of the structure and skin of your feet, and a check of the pulses and sensation in your feet. ? Type 1 diabetes: Get your first exam 3-5 years after diagnosis. ? Type 2 diabetes: Get your first exam as soon as you are diagnosed.  Check your feet every day for cuts, bruises, redness, blisters, or sores. If you have any of these or other problems that are not healing, contact your health care provider.  Kidney function test (urine microalbumin)  This test is done once a year. ? Type 1 diabetes: Get your first test 5 years after diagnosis. ? Type 2 diabetes: Get your first test as soon as you are diagnosed._  If you have chronic kidney disease (CKD), get a serum  creatinine and estimated glomerular filtration rate (eGFR) test once a year.  Lipid profile (cholesterol, HDL, LDL, triglycerides)  This test should be done when you are diagnosed with diabetes, and every 5 years after the first test. If you are on medicines to lower your cholesterol, you may need to get this test done every year. ? The goal for LDL is less than 100 mg/dL (5.5 mmol/L). If you are at high risk, the goal is less than 70 mg/dL (3.9 mmol/L). ? The goal for HDL is 40 mg/dL (2.2 mmol/L) for men and 50 mg/dL(2.8 mmol/L) for women. An HDL cholesterol of 60 mg/dL (3.3 mmol/L) or higher gives some protection against heart  disease. ? The goal for triglycerides is less than 150 mg/dL (8.3 mmol/L).  Immunizations  The yearly flu (influenza) vaccine is recommended for everyone 6 months or older who has diabetes.  The pneumonia (pneumococcal) vaccine is recommended for everyone 2 years or older who has diabetes. If you are 60 or older, you may get the pneumonia vaccine as a series of two separate shots.  The hepatitis B vaccine is recommended for adults shortly after they have been diagnosed with diabetes.  The Tdap (tetanus, diphtheria, and pertussis) vaccine should be given: ? According to normal childhood vaccination schedules, for children. ? Every 10 years, for adults who have diabetes.  The shingles vaccine is recommended for people who have had chicken pox and are 50 years or older.  Mental and emotional health  Screening for symptoms of eating disorders, anxiety, and depression is recommended at the time of diagnosis and afterward as needed. If your screening shows that you have symptoms (you have a positive screening result), you may need further evaluation and be referred to a mental health care provider.  Diabetes self-management education  Education about how to manage your diabetes is recommended at diagnosis and ongoing as needed.  Treatment plan  Your treatment plan will be reviewed at every medical visit.  Summary  Managing diabetes (diabetes mellitus) can be complicated. Your diabetes treatment may be managed by a team of health care providers.  Your health care providers follow a schedule in order to help you get the best quality of care.  Standards of care including having regular physical exams, blood tests, blood pressure monitoring, immunizations, screening tests, and education about how to manage your diabetes.  Your health care providers may also give you more specific instructions based on your individual health.      Type 2 Diabetes Mellitus, Self Care, Adult Caring for  yourself after you have been diagnosed with type 2 diabetes (type 2 diabetes mellitus) means keeping your blood sugar (glucose) under control with a balance of:  Nutrition.  Exercise.  Lifestyle changes.  Medicines or insulin, if necessary.  Support from your team of health care providers and others.  The following information explains what you need to know to manage your diabetes at home. What do I need to do to manage my blood glucose?  Check your blood glucose every day, as often as told by your health care provider.  Contact your health care provider if your blood glucose is above your target for 2 tests in a row.  Have your A1c (hemoglobin A1c) level checked at least two times a year, or as often as told by your health care provider. Your health care provider will set individualized treatment goals for you. Generally, the goal of treatment is to maintain the following blood glucose levels:  Before  meals (preprandial): 80-130 mg/dL (4.4-7.2 mmol/L).  After meals (postprandial): below 180 mg/dL (10 mmol/L).  A1c level: less than 7%.  What do I need to know about hyperglycemia and hypoglycemia? What is hyperglycemia? Hyperglycemia, also called high blood glucose, occurs when blood glucose is too high.Make sure you know the early signs of hyperglycemia, such as:  Increased thirst.  Hunger.  Feeling very tired.  Needing to urinate more often than usual.  Blurry vision.  What is hypoglycemia? Hypoglycemia, also called low blood glucose, occurswith a blood glucose level at or below 70 mg/dL (3.9 mmol/L). The risk for hypoglycemia increases during or after exercise, during sleep, during illness, and when skipping meals or not eating for a long time (fasting). It is important to know the symptoms of hypoglycemia and treat it right away. Always have a 15-gram rapid-acting carbohydrate snack with you to treat low blood glucose. Family members and close friends should also know  the symptoms and should understand how to treat hypoglycemia, in case you are not able to treat yourself. What are the symptoms of hypoglycemia? Hypoglycemia symptoms can include:  Hunger.  Anxiety.  Sweating and feeling clammy.  Confusion.  Dizziness or feeling light-headed.  Sleepiness.  Nausea.  Increased heart rate.  Headache.  Blurry vision.  Seizure.  Nightmares.  Tingling or numbness around the mouth, lips, or tongue.  A change in speech.  Decreased ability to concentrate.  A change in coordination.  Restless sleep.  Tremors or shakes.  Fainting.  Irritability.  How do I treat hypoglycemia?  If you are alert and able to swallow safely, follow the 15:15 rule:  Take 15 grams of a rapid-acting carbohydrate. Rapid-acting options include: ? 1 tube of glucose gel. ? 3 glucose pills. ? 6-8 pieces of hard candy. ? 4 oz (120 mL) of fruit juice. ? 4 oz (120 mL) of regular (not diet) soda.  Check your blood glucose 15 minutes after you take the carbohydrate.  If the repeat blood glucose level is still at or below 70 mg/dL (3.9 mmol/L), take 15 grams of a carbohydrate again.  If your blood glucose level does not increase above 70 mg/dL (3.9 mmol/L) after 3 tries, seek emergency medical care.  After your blood glucose level returns to normal, eat a meal or a snack within 1 hour.  How do I treat severe hypoglycemia? Severe hypoglycemia is when your blood glucose level is at or below 54 mg/dL (3 mmol/L). Severe hypoglycemia is an emergency. Do not wait to see if the symptoms will go away. Get medical help right away. Call your local emergency services (911 in the [place] Do not drive yourself to the hospital. If you have severe hypoglycemia and you cannot eat or drink, you may need an injection of glucagon. A family member or close friend should learn how to check your blood glucose and how to give you a glucagon injection. Ask your health care provider if you  need to have an emergency glucagon injection kit available. Severe hypoglycemia may need to be treated in a hospital. The treatment may include getting glucose through an IV tube. You may also need treatment for the cause of your hypoglycemia. Can having diabetes put me at risk for other conditions? Having diabetes can put you at risk for other long-term (chronic) conditions, such as heart disease and kidney disease. Your health care provider may prescribe medicines to help prevent complications from diabetes. These medicines may include:  Aspirin.  Medicine to lower cholesterol.  Medicine to control blood pressure.  What else can I do to manage my diabetes? Take your diabetes medicines as told  If your health care provider prescribed insulin or diabetes medicines, take them every day.  Do not run out of insulin or other diabetes medicines that you take. Plan ahead so you always have these available.  If you use insulin, adjust your dosage based on how physically active you are and what foods you eat. Your health care provider will tell you how to adjust your dosage. Make healthy food choices  The things that you eat and drink affect your blood glucose and your insulin dosage. Making good choices helps to control your diabetes and prevent other health problems. A healthy meal plan includes eating lean proteins, complex carbohydrates, fresh fruits and vegetables, low-fat dairy products, and healthy fats. Make an appointment to see a diet and nutrition specialist (registered dietitian) to help you create an eating plan that is right for you. Make sure that you:  Follow instructions from your health care provider about eating or drinking restrictions.  Drink enough fluid to keep your urine clear or pale yellow.  Eat healthy snacks between nutritious meals.  Track the carbohydrates that you eat. Do this by reading food labels and learning the standard serving sizes of foods.  Follow your  sick day plan whenever you cannot eat or drink as usual. Make this plan in advance with your health care provider.  Stay active  Exercise regularly, as told by your health care provider. This may include:  Stretching and doing strength exercises, such as yoga or weightlifting, at least 2 times a week.  Doing at least 150 minutes of moderate-intensity or vigorous-intensity exercise each week. This could be brisk walking, biking, or water aerobics. ? Spread out your activity over at least 3 days of the week. ? Do not go more than 2 days in a row without doing some kind of physical activity.  When you start a new exercise or activity, work with your health care provider to adjust your insulin, medicines, or food intake as needed. Make healthy lifestyle choices  Do not use any tobacco products, such as cigarettes, chewing tobacco, and e-cigarettes. If you need help quitting, ask your health care provider.  If your health care provider says that alcohol is safe for you, limit alcohol intake to no more than 1 drink per day for nonpregnant women and 2 drinks per day for men. One drink equals 12 oz of beer, 5 oz of wine, or 1 oz of hard liquor.  Learn to manage stress. If you need help with this, ask your health care provider. Care for your body   Keep your immunizations up to date. In addition to getting vaccinations as told by your health care provider, it is recommended that you get vaccinated against the following illnesses: ? The flu (influenza). Get a flu shot every year. ? Pneumonia. ? Hepatitis B.  Schedule an eye exam soon after your diagnosis, and then one time every year after that.  Check your skin and feet every day for cuts, bruises, redness, blisters, or sores. Schedule a foot exam with your health care provider once every year.  Brush your teeth and gums two times a day, and floss at least one time a day. Visit your dentist at least once every 6 months.  Maintain a healthy  weight. General instructions  Take over-the-counter and prescription medicines only as told by your health care provider.  Share  your diabetes management plan with people in your workplace, school, and household.  Check your urine for ketones when you are ill and as told by your health care provider.  Ask your health care provider: ? Do I need to meet with a diabetes educator? ? Where can I find a support group for people with diabetes?  Carry a medical alert card or wear medical alert jewelry.  Keep all follow-up visits as told by your health care provider. This is important. Where to find more information: For more information about diabetes, visit:  American Diabetes Association (ADA): www.diabetes.org  American Association of Diabetes Educators (AADE): www.diabeteseducator.org/patient-resources  This information is not intended to replace advice given to you by your health care provider. Make sure you discuss any questions you have with your health care provider. Document Released: 04/03/2016 Document Revised: 05/18/2016 Document Reviewed: 01/14/2016 Elsevier Interactive Patient Education  2017 [place].      Blood Glucose Monitoring, Adult Monitoring your blood sugar (glucose) helps you manage your diabetes. It also helps you and your health care provider determine how well your diabetes management plan is working. Blood glucose monitoring involves checking your blood glucose as often as directed, and keeping a record (log) of your results over time. Why should I monitor my blood glucose? Checking your blood glucose regularly can:  Help you understand how food, exercise, illnesses, and medicines affect your blood glucose.  Let you know what your blood glucose is at any time. You can quickly tell if you are having low blood glucose (hypoglycemia) or high blood glucose (hyperglycemia).  Help you and your health care provider adjust your medicines as needed.  When  should I check my blood glucose? Follow instructions from your health care provider about how often to check your blood glucose.   This may depend on:  The type of diabetes you have.  How well-controlled your diabetes is.  Medicines you are taking.  If you have type 1 diabetes:  Check your blood glucose at least 2 times a day.  Also check your blood glucose: ? Before every insulin injection. ? Before and after exercise. ? Between meals. ? 2 hours after a meal. ? Occasionally between 2:00 a.m. and 3:00 a.m., as directed. ? Before potentially dangerous tasks, like driving or using heavy machinery. ? At bedtime.  You may need to check your blood glucose more often, up to 6-10 times a day: ? If you use an insulin pump. ? If you need multiple daily injections (MDI). ? If your diabetes is not well-controlled. ? If you are ill. ? If you have a history of severe hypoglycemia. ? If you have a history of not knowing when your blood glucose is getting low (hypoglycemia unawareness).  If you have type 2 diabetes:  If you take insulin or other diabetes medicines, check your blood glucose at least 2 times a day.  If you are on intensive insulin therapy, check your blood glucose at least 4 times a day. Occasionally, you may also need to check between 2:00 a.m. and 3:00 a.m., as directed.  Also check your blood glucose: ? Before and after exercise. ? Before potentially dangerous tasks, like driving or using heavy machinery.  You may need to check your blood glucose more often if: ? Your medicine is being adjusted. ? Your diabetes is not well-controlled. ? You are ill.  What is a blood glucose log?  A blood glucose log is a record of your blood glucose readings. It  helps you and your health care provider: ? Look for patterns in your blood glucose over time. ? Adjust your diabetes management plan as needed.  Every time you check your blood glucose, write down your result and notes  about things that may be affecting your blood glucose, such as your diet and exercise for the day.  Most glucose meters store a record of glucose readings in the meter. Some meters allow you to download your records to a computer. How do I check my blood glucose? Follow these steps to get accurate readings of your blood glucose: Supplies needed   Blood glucose meter.  Test strips for your meter. Each meter has its own strips. You must use the strips that come with your meter.  A needle to prick your finger (lancet). Do not use lancets more than once.  A device that holds the lancet (lancing device).  A journal or log book to write down your results.  Procedure  Wash your hands with soap and water.  Prick the side of your finger (not the tip) with the lancet. Use a different finger each time.  Gently rub the finger until a small drop of blood appears.  Follow instructions that come with your meter for inserting the test strip, applying blood to the strip, and using your blood glucose meter.  Write down your result and any notes.  Alternative testing sites  Some meters allow you to use areas of your body other than your finger (alternative sites) to test your blood.  If you think you may have hypoglycemia, or if you have hypoglycemia unawareness, do not use alternative sites. Use your finger instead.  Alternative sites may not be as accurate as the fingers, because blood flow is slower in these areas. This means that the result you get may be delayed, and it may be different from the result that you would get from your finger.  The most common alternative sites are: ? Forearm. ? Thigh. ? Palm of the hand.  Additional tips  Always keep your supplies with you.  If you have questions or need help, all blood glucose meters have a 24-hour hotline number that you can call. You may also contact your health care provider.  After you use a few boxes of test strips, adjust  (calibrate) your blood glucose meter by following instructions that came with your meter.    The American Diabetes Association suggests the following targets for most nonpregnant adults with diabetes.  More or less stringent glycemic goals may be appropriate for each individual.  A1C: Less than 7% A1C may also be reported as eAG: Less than 154 mg/dl Before a meal (preprandial plasma glucose): 80-130 mg/dl 1-2 hours after beginning of the meal (Postprandial plasma glucose)*: Less than 180 mg/dl  *Postprandial glucose may be targeted if A1C goals are not met despite reaching preprandial glucose goals.   GOALS in short:  The goals are for the Hgb A1C to be less than 7.0 & blood pressure to be less than 130/80.    It is recommended that all diabetics are educated on and follow a healthy diabetic diet, exercise for 30 minutes 3-4 times per week (walking, biking, swimming, or machine), monitor blood glucose readings and bring that record with you to be reviewed at your next office visit.     You should be checking fasting blood sugars- especially after you eat poorly or eat really healthy, and also check 2 hour postprandial blood sugars after largest meal  of the day.    Write these down and bring in your log at each office visit.    You will need to be seen every 3 months by the provider managing your Diabetes unless told otherwise by that provider.   You will need yearly eye exams from an eye specialist and foot exams to check the nerves of your feet.  Also, your urine should be checked yearly as well to make sure excess protein is not present.   If you are checking your blood pressure at home, please record it and bring it to your next office visit.    Follow the Dietary Approaches to Stop Hypertension (DASH) diet (3 servings of fruit and vegetables daily, whole grains, low sodium, low-fat proteins).  See below.    Lastly, when it comes to your cholesterol, the goal is to have the HDL (good  cholesterol) >40, and the LDL (bad cholesterol) <100.   It is recommended that you follow a heart healthy, low saturated and trans-fat diet and exercise for 30 minutes at least 5 times a week.     (( Check out the DASH diet = 1.5 Gram Low Sodium Diet   A 1.5 gram sodium diet restricts the amount of sodium in the diet to no more than 1.5 g or 1500 mg daily.  The American Heart Association recommends Americans over the age of 20 to consume no more than 1500 mg of sodium each day to reduce the risk of developing high blood pressure.  Research also shows that limiting sodium may reduce heart attack and stroke risk.  Many foods contain sodium for flavor and sometimes as a preservative.  When the amount of sodium in a diet needs to be low, it is important to know what to look for when choosing foods and drinks.  The following includes some information and guidelines to help make it easier for you to adapt to a low sodium diet.    QUICK TIPS  Do not add salt to food.  Avoid convenience items and fast food.  Choose unsalted snack foods.  Buy lower sodium products, often labeled as "lower sodium" or "no salt added."  Check food labels to learn how much sodium is in 1 serving.  When eating at a restaurant, ask that your food be prepared with less salt or none, if possible.    READING FOOD LABELS FOR SODIUM INFORMATION  The nutrition facts label is a good place to find how much sodium is in foods. Look for products with no more than 400 mg of sodium per serving.  Remember that 1.5 g = 1500 mg.  The food label may also list foods as:  Sodium-free: Less than 5 mg in a serving.  Very low sodium: 35 mg or less in a serving.  Low-sodium: 140 mg or less in a serving.  Light in sodium: 50% less sodium in a serving. For example, if a food that usually has 300 mg of sodium is changed to become light in sodium, it will have 150 mg of sodium.  Reduced sodium: 25% less sodium in a serving. For example, if a food  that usually has 400 mg of sodium is changed to reduced sodium, it will have 300 mg of sodium.    CHOOSING FOODS  Grains  Avoid: Salted crackers and snack items. Some cereals, including instant hot cereals. Bread stuffing and biscuit mixes. Seasoned rice or pasta mixes.  Choose: Unsalted snack items. Low-sodium cereals, oats, puffed wheat and  rice, shredded wheat. English muffins and bread. Pasta.  Meats  Avoid: Salted, canned, smoked, spiced, pickled meats, including fish and poultry. Bacon, ham, sausage, cold cuts, hot dogs, anchovies.  Choose: Low-sodium canned tuna and salmon. Fresh or frozen meat, poultry, and fish.  Dairy  Avoid: Processed cheese and spreads. Cottage cheese. Buttermilk and condensed milk. Regular cheese.  Choose: Milk. Low-sodium cottage cheese. Yogurt. Sour cream. Low-sodium cheese.  Fruits and Vegetables  Avoid: Regular canned vegetables. Regular canned tomato sauce and paste. Frozen vegetables in sauces. Olives. Antonio Patterson. Relishes. Sauerkraut.  Choose: Low-sodium canned vegetables. Low-sodium tomato sauce and paste. Frozen or fresh vegetables. Fresh and frozen fruit.  Condiments  Avoid: Canned and packaged gravies. Worcestershire sauce. Tartar sauce. Barbecue sauce. Soy sauce. Steak sauce. Ketchup. Onion, garlic, and table salt. Meat flavorings and tenderizers.  Choose: Fresh and dried herbs and spices. Low-sodium varieties of mustard and ketchup. Lemon juice. Tabasco sauce. Horseradish.    SAMPLE 1.5 GRAM SODIUM MEAL PLAN:   Breakfast / Sodium (mg)  1 cup low-fat milk / 143 mg  1 whole-wheat English muffin / 240 mg  1 tbs heart-healthy margarine / 153 mg  1 hard-boiled egg / 139 mg  1 small orange / 0 mg  Lunch / Sodium (mg)  1 cup raw carrots / 76 mg  2 tbs no salt added peanut butter / 5 mg  2 slices whole-wheat bread / 270 mg  1 tbs jelly / 6 mg   cup red grapes / 2 mg  Dinner / Sodium (mg)  1 cup whole-wheat pasta / 2 mg  1 cup low-sodium tomato  sauce / 73 mg  3 oz lean ground beef / 57 mg  1 small side salad (1 cup raw spinach leaves,  cup cucumber,  cup yellow bell pepper) with 1 tsp olive oil and 1 tsp red wine vinegar / 25 mg  Snack / Sodium (mg)  1 container low-fat vanilla yogurt / 107 mg  3 graham cracker squares / 127 mg  Nutrient Analysis  Calories: 1745  Protein: 75 g  Carbohydrate: 237 g  Fat: 57 g  Sodium: 1425 mg  Document Released: 12/11/2005 Document Revised: 08/23/2011 Document Reviewed: 03/14/2010  ExitCare Patient Information 2012 [place].))    This information is not intended to replace advice given to you by your health care provider. Make sure you discuss any questions you have with your health care provider. Document Released: 12/14/2003 Document Revised: 06/30/2016 Document Reviewed: 05/22/2016 Elsevier Interactive Patient Education  2017 [employer].

## 2019-03-07 NOTE — Progress Notes (Signed)
Impression and Recommendations:    1. Encounter for Medicare annual wellness exam    Encounter for Medicare annual wellness exam  Prior Labs reviewed from 02/28/2019:  -Labs reviewed and discussed in detail with the patient today.  -All labs from 02/28/2019 WNL except for the vitamin D and A1c.    DM: -Patient was started on Ozempic on the last visit, however, the pt didn't start on this medication due to it being over $500 with insurance. Discontinue Ozempic today.  -A1c on 02/28/2019 was at 7.6 which was increased from 6.9 on 08/09/2018. Discussed with the patient that we would like this to be at or below goal of 7.0.  - Counseled patient on pathophysiology of disease and discussed various treatment options, which often includes dietary and lifestyle modifications as first line.  Importance of low carb/ketogenic diet discussed with patient in addition to regular exercise.  - Check FBS and 2 hours after the biggest meal of your day.  Keep log and bring in next OV for my review.   Also, if you ever feel poorly, please check your blood pressure and blood sugar, as one or the other could be the cause of your symptoms.  - Being a diabetic, you need yearly eye and foot exams. Make appt.for diabetic eye exam   - Continue current medication(s) and the patient will be started on Victoza and will start on SGLT2 inhibitor as well at a low dose.  Patient given GoodRx information to help and determine if there is a cheaper cost for his medication. Also, risks and benefits of medications discussed with patient, including alternative treatments.   Encouraged patient to read drug information handouts to further educate self about the medicine prior to starting it. Contact us prior with any questions/ concerns.    HTN:  --Blood pressure in the office today at 108/69.  -Advised that the patients' blood pressure should remain at or above goal of 130/80.  -Monitor blood pressure 2-3 times a week and will keep a  blood pressure log and bring it into the office on next visit.  -Lifestyle changes such as dash diet and engaging in a regular exercise program discussed with patient.  Educational handouts provided -Patient was started on amlodipine on last visit which he is taking a full tablet of.  -Patient continues on Hyzar and amlodipine with good tolerance.   Erectile Dysfunction:  -Patient prescribed Cialis however, he didn't start this medication due to costs -Patient given GoodRx information to help and determine if there is a cheaper cost for his medication.   Seasonal Allergies -Patient on Claritin-D, allegra, and flonase. Recommended that the patient to discontinue Claritin and continue on allegra and flonase only.  -Advised the patient to continue with AYR or Neilmed sinus rinses BID followed by flonase BID (one spray to each nostril).   Vitamin D Deficiency:  -Labs reviewed from 02/28/2019 and note that the vitamin D level was at 10.6.  -Discussed with the patient that we typically like this range to be between 40-60. Will prescribe and start the patient to start on Drisdol today  BPH:  -Patient currently on flomax at this time, however, he notes that his nocturia has worsened.  -Discussed with the patient that this could be contributed to his Claritin-D that he is taking. Advised the patient to discontinue use of Claritin-D.     Follow up in 6 weeks for medicare wellness exam. Follow up in 3 months for chronic follow up.  Education and routine counseling performed. Handouts provided.  No orders of the defined types were placed in this encounter.   Medications Discontinued During This Encounter  Medication Reason  . Semaglutide,0.25 or 0.5MG /DOS, (OZEMPIC, 0.25 OR 0.5 MG/DOSE,) 2 MG/1.5ML SOPN Error  . tadalafil (CIALIS) 5 MG tablet Error      No orders of the defined types were placed in this encounter.   The patient was counseled, risk factors were discussed, anticipatory  guidance given.  Gross side effects, risk and benefits, and alternatives of medications discussed with patient.  Patient is aware that all medications have potential side effects and we are unable to predict every side effect or drug-drug interaction that may occur.  Expresses verbal understanding and consents to current therapy plan and treatment regimen.   No follow-ups on file.   Please see AVS handed out to patient at the end of our visit for further patient instructions/ counseling done pertaining to today's office visit.    Note:  This document was prepared using Dragon voice recognition software and may include unintentional dictation errors.   This document serves as a record of services personally performed by Thomasene Lot, DO. It was created on her behalf by Chestine Spore, a trained medical scribe. The creation of this record is based on the scribe's personal observations and the provider's statements to them.   I have reviewed the above medical documentation for accuracy and completeness and I concur.  Thomasene Lot, DO 03/19/2019 4:32 PM     Subjective:    Chief Complaint  Patient presents with  . Medicare Wellness     Antonio Patterson is a 68 y.o. male who presents to [facility] today for Diabetes Management.    1. HTN HPI: -  His blood pressure has been controlled at home.  Pt is checking it at home and his systolic blood pressure is 120-160.   - Patient reports good compliance with blood pressure medications and is taking a full tab of amlodipine.   - Denies medication S-E   - Smoking Status noted   - He denies new onset of: chest pain, exercise intolerance, shortness of breath, dizziness, visual changes, headache, lower extremity swelling or claudication.   Last 3 blood pressure readings in our office are as follows: BP Readings from Last 3 Encounters:  03/07/19 108/69  02/28/19 (!) 148/84  01/03/19 (!) 143/81    Filed  Weights   03/07/19 1009  Weight: 219 lb 3.2 oz (99.4 kg)     DM HPI: -  He has not been working on diet and exercise for diabetes  Pt is currently maintained on the following medications for diabetes:   see med list today Medication compliance - He didn't start on Ozempic because it was $500+ with his insurance. He continues on Novolin-R and is taking at least 30-35 units up to 3-4 times a day. He completes this on a sliding scale.   He was previously on Metformin and several other medications with his prior PCP. He hasn't tried invokona or victoza.   Home glucose readings range: Fasting blood sugar ranges from 100-130-140 depending on what he eats the night before.    Denies polyuria/polydipsia. Denies hypo/ hyperglycemia symptoms - He denies new onset of: chest pain, exercise intolerance, shortness of breath, dizziness, visual changes, headache, lower extremity swelling or claudication.   Last diabetic eye exam was No results found for: HMDIABEYEEXA  Foot exam- UTD  Last A1C in  the office was:  Lab Results  Component Value Date   HGBA1C 7.6 (H) 02/28/2019   HGBA1C 6.9 (A) 08/09/2018   HGBA1C 6.8 02/22/2018    Lab Results  Component Value Date   MICROALBUR <0.7 02/22/2018   LDLCALC 79 02/28/2019   CREATININE 0.97 02/28/2019      Last 3 blood pressure readings in our office are as follows: BP Readings from Last 3 Encounters:  03/07/19 108/69  02/28/19 (!) 148/84  01/03/19 (!) 143/81    BMI Readings from Last 3 Encounters:  03/07/19 36.48 kg/m  02/28/19 34.30 kg/m  01/03/19 34.21 kg/m     No problems updated.    Patient Care Team    Relationship Specialty Notifications Start End  Thomasene Lot, DO PCP - General Family Medicine  01/02/19   Romero Belling, MD Consulting Physician Endocrinology  01/03/19      Patient Active Problem List   Diagnosis Date Noted  . Morbid obesity (HCC) 01/03/2019  . Mixed diabetic hyperlipidemia associated with type 2  diabetes mellitus (HCC) 01/03/2019  . Hypertension associated with diabetes (HCC) 01/03/2019  . Chronic pansinusitis 04/06/2017  . Nasal turbinate hypertrophy 04/06/2017  . Gastroesophageal reflux disease without esophagitis 01/05/2017  . Globus pharyngeus 01/05/2017  . Cough 12/08/2016  . Hand pain 08/24/2016  . Wellness examination 07/09/2015  . Headache(784.0) 10/31/2013  . Screening for prostate cancer 03/28/2013  . ABNORMAL ELECTROCARDIOGRAM 10/21/2010  . 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 ERECTILE DYSFUNCTION 11/13/2008  . ALCOHOL USE 11/13/2008  . UNSPECIFIED PERIPHERAL VASCULAR DISEASE 11/13/2008  . BLADDER OUTLET OBSTRUCTION 11/13/2008  . SEBORRHEA 11/13/2008  . Diabetes (HCC) 08/10/2007  . Dyslipidemia 08/10/2007  . Essential hypertension 08/10/2007  . ALLERGIC RHINITIS 08/10/2007     Past Medical History:  Diagnosis Date  . ALLERGIC RHINITIS 08/10/2007  . BLADDER OUTLET OBSTRUCTION 11/13/2008  . DIABETES MELLITUS, TYPE II 08/10/2007  . ERECTILE DYSFUNCTION 11/13/2008  . HYPERLIPIDEMIA 08/10/2007  . HYPERTENSION 08/10/2007  . PERS HX NONCOMPLIANCE W/MED TX PRS HAZARDS HLTH 11/13/2008  . Seborrhea 11/13/2008  . 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 UNSPECIFIED PERIPHERAL VASCULAR DISEASE 11/13/2008     Past Surgical History:  Procedure Laterality Date  . TURBINATE REDUCTION Bilateral 2017  . VASECTOMY  1976     Family History  Problem Relation Age of Onset  . Heart disease Father   . Cancer Maternal Grandmother        breast  . Stroke Maternal Grandfather   . Cancer Paternal Grandfather      Social History   Substance and Sexual Activity  Drug Use Never  ,  Social History   Substance and Sexual Activity  Alcohol Use Yes   Comment: 6/week  ,  Social History   Tobacco Use  Smoking Status Former Smoker  . Packs/day: 1.00  . Years: 10.00  . Pack years: 10.00  . Last attempt to quit: 1971  . Years since quitting: 49.2   Smokeless Tobacco Never Used  ,    Current Outpatient Medications on File Prior to Visit  Medication Sig Dispense Refill  . amLODipine (NORVASC) 5 MG tablet Take 1 tablet (5 mg total) by mouth daily. 90 tablet 3  . aspirin 81 MG tablet Take 81 mg by mouth daily.      . fexofenadine (ALLEGRA) 180 MG tablet Take 1 tablet (180 mg total) by mouth daily. 90 tablet 0  . fluticasone (FLONASE) 50 MCG/ACT nasal spray Place 2 sprays into the nose daily. 16 g 11  .  glucose blood (RELION GLUCOSE TEST STRIPS) test strip Use as instructed     . insulin regular (NOVOLIN R,HUMULIN R) 100 units/mL injection Inject 0.6 mLs (60 Units total) into the skin 2 (two) times daily before a meal. And syringes 2/day 40 mL 11  . loratadine-pseudoephedrine (LORATADINE-D 24HR) 10-240 MG 24 hr tablet Take 1 tablet by mouth daily. 90 tablet 0  . losartan-hydrochlorothiazide (HYZAAR) 100-12.5 MG tablet TAKE 1 TABLET BY MOUTH ONCE DAILY 90 tablet 0  . omeprazole (PRILOSEC) 40 MG capsule Take 1 capsule (40 mg total) by mouth daily. 90 capsule 3  . simvastatin (ZOCOR) 40 MG tablet TAKE 1 TABLET BY MOUTH ONCE DAILY IN THE EVENING 90 tablet 2  . tamsulosin (FLOMAX) 0.4 MG CAPS capsule Take 1 capsule (0.4 mg total) by mouth daily. 90 capsule 1  . traMADol (ULTRAM) 50 MG tablet TAKE 1 TABLET BY MOUTH EVERY 8 HOURS AS NEEDED 50 tablet 4   No current facility-administered medications on file prior to visit.      Allergies  Allergen Reactions  . Metformin   . Rosuvastatin   . Sulfonamide Derivatives      Review of Systems:   General:  Denies fever, chills Optho/Auditory:   Denies visual changes, blurred vision Respiratory:   Denies SOB, cough, wheeze, DIB  Cardiovascular:   Denies chest pain, palpitations, painful respirations Gastrointestinal:   Denies nausea, vomiting, diarrhea.  Endocrine:     Denies new hot or cold intolerance Musculoskeletal:  Denies joint swelling, gait issues, or new unexplained myalgias/  arthralgias Skin:  Denies rash, suspicious lesions  Neurological:    Denies dizziness, unexplained weakness, numbness  Psychiatric/Behavioral:   Denies mood changes    Objective:     Blood pressure 108/69, pulse 85, temperature 98.3 F (36.8 C), temperature source Oral, height 5\' 5"  (1.651 m), weight 219 lb 3.2 oz (99.4 kg), SpO2 95 %.  Body mass index is 36.48 kg/m.  General: Well Developed, well nourished, and in no acute distress.  HEENT: Normocephalic, atraumatic, pupils equal round reactive to light, neck supple, No carotid bruits, no JVD Skin: Warm and dry, cap RF less 2 sec Cardiac: Regular rate and rhythm, S1, S2 WNL's, no murmurs rubs or gallops Respiratory: ECTA B/L, Not using accessory muscles, speaking in full sentences. NeuroM-Sk: Ambulates w/o assistance, moves ext * 4 w/o difficulty, sensation grossly intact. Pitting edema in BLE about halfway up his calf bilaterally.  Ext: scant edema b/l lower ext Psych: No HI/SI, judgement and insight good, Euthymic mood. Full Affect.

## 2019-03-07 NOTE — Telephone Encounter (Signed)
Patient states that he got a prescription for Victoza at [facility] today, and when he went to pharm to pick it up, it was going to be about $500 (which he cannot afford). He is requesting a new prescription of something else cheaper if possible, and please send new med to [facility] in [place].

## 2019-03-10 ENCOUNTER — Other Ambulatory Visit: Payer: Self-pay

## 2019-03-11 ENCOUNTER — Other Ambulatory Visit: Payer: Self-pay

## 2019-03-11 MED ORDER — LOSARTAN POTASSIUM-HCTZ 100-12.5 MG PO TABS: 1.0000 | ORAL_TABLET | Freq: Every day | ORAL | 0 refills | Status: DC

## 2019-03-11 NOTE — Telephone Encounter (Signed)
Called patient and advised patient to call insurance to find out what medication in that class is covered and would be a lower cost for him. MPulliam, CMA/RT(R)

## 2019-03-11 NOTE — Telephone Encounter (Signed)
Patient is requesting a refill on losartan-hctz.  Medication was last filled by a previous provider.  LOV 03/07/2019. MPulliam, CMA/RT(R)

## 2019-03-14 ENCOUNTER — Other Ambulatory Visit: Payer: Self-pay

## 2019-03-14 DIAGNOSIS — I10 Essential (primary) hypertension: Secondary | ICD-10-CM

## 2019-03-14 MED ORDER — HYDROCHLOROTHIAZIDE 12.5 MG PO CAPS: 12.5000 mg | ORAL_CAPSULE | Freq: Every day | ORAL | 0 refills | Status: DC

## 2019-03-14 MED ORDER — LOSARTAN POTASSIUM 100 MG PO TABS: 100.0000 mg | ORAL_TABLET | Freq: Every day | ORAL | 0 refills | Status: DC

## 2019-03-14 NOTE — Telephone Encounter (Signed)
Losartan-HCTZ combo tablet is on backorder, sent in medications separate. MPulliam, CMA/RT(R)

## 2019-03-18 ENCOUNTER — Telehealth: Payer: Self-pay

## 2019-03-18 NOTE — Telephone Encounter (Signed)
Patient called to notify that with his insurance the Victoza is $500 and he can not afford that. I advised the patient to call his insurance to see which medication in that class of meds are formulary.  Patient has not called me back.  Please review and advise if change in med is approved. LOV 03/07/2019.  MPulliam, CMA/RT(R)

## 2019-03-19 ENCOUNTER — Other Ambulatory Visit: Payer: Self-pay

## 2019-03-19 ENCOUNTER — Telehealth: Payer: Self-pay

## 2019-03-19 NOTE — Telephone Encounter (Signed)
What have his blood sugars been running at home fasting as well as 2 hours post-prandial as he is using his insulin I presume

## 2019-03-19 NOTE — Telephone Encounter (Signed)
lmtcb ref. Telephone visit 3/26 

## 2019-03-19 NOTE — Telephone Encounter (Signed)
Called patient's insurance and all GLP-1s are $400-600 and the patient is not able to afford, please review and advise. MPulliam, CMA/RT(R)

## 2019-03-19 NOTE — Telephone Encounter (Signed)
Spoke to the patient and he does not currently have a log of hs blood sugars on him.  He states that he was having the problem with morning levels being 140-200 since starting the Jardiance in addition to his insulin fasting AM levels have improved and are ranging 90-120.  Please review and advise. MPulliam, CMA/RT(R)

## 2019-03-19 NOTE — Telephone Encounter (Signed)
Great so with him being 68 years old, if all of his fasting blood sugars are between 90 and 120 now being on Jardiance in addition to his insulin, he is at goal and I recommend he continue this regimen and follow-up as we discussed last office visit.   Being 68 years old I would not want to increase or change the dose and make his blood sugars go any lower.  Thus tell him to stay the course

## 2019-03-19 NOTE — Telephone Encounter (Signed)
Patient notified. MPulliam, CMA/RT(R)  

## 2019-03-20 ENCOUNTER — Telehealth: Payer: Medicare Other | Admitting: Gastroenterology

## 2019-03-21 ENCOUNTER — Ambulatory Visit: Payer: Medicare Other | Admitting: Gastroenterology

## 2019-04-30 ENCOUNTER — Other Ambulatory Visit: Payer: Self-pay | Admitting: Endocrinology

## 2019-04-30 ENCOUNTER — Other Ambulatory Visit: Payer: Self-pay

## 2019-04-30 NOTE — Telephone Encounter (Signed)
Pharmacy sent refill request for Omeprazole 40mg , medication was last filled by a pervious provider.   LOV 03/07/2019.  Please review and advised. MPulliam, CMA/RT(R)

## 2019-05-01 NOTE — Telephone Encounter (Signed)
Patient's medical history -without a diagnosis of Barrett's esophagitis.  I do not recommend that higher dose-that dose it is not appropriate for reflux which is what is on his med problem list.  Okay to refill 20 mg, 1 p.o. daily, dispense 30 with 1 refill.  Patient will need an office visit to discuss how he is doing with his symptoms. -Tell patient I recommend for better control of his GERD that he avoid spicy or reflux triggering foods.  He avoid any more alcohol than 1-2 drinks per day, avoid eating or drinking anything within 3 to 4 hours of laying down to sleep or rest and avoid excess caffeine use as all of these can significantly worsen reflux symptoms  -Thank you.

## 2019-05-05 ENCOUNTER — Telehealth: Payer: Self-pay | Admitting: Family Medicine

## 2019-05-05 ENCOUNTER — Other Ambulatory Visit: Payer: Self-pay | Admitting: Family Medicine

## 2019-05-05 DIAGNOSIS — J302 Other seasonal allergic rhinitis: Secondary | ICD-10-CM

## 2019-05-05 MED ORDER — LORATADINE 10 MG PO TABS: 10.0000 mg | ORAL_TABLET | Freq: Every day | ORAL | 0 refills | Status: DC

## 2019-05-05 MED ORDER — OMEPRAZOLE 20 MG PO CPDR: 40.0000 mg | DELAYED_RELEASE_CAPSULE | Freq: Every day | ORAL | 1 refills | Status: DC

## 2019-05-05 NOTE — Telephone Encounter (Signed)
Medication sent in per note and patient notified. MPulliam, CMA/RT(R)  

## 2019-05-05 NOTE — Addendum Note (Signed)
Addended by: Leda Min D on: 05/05/2019 02:04 PM   Modules accepted: Orders

## 2019-05-05 NOTE — Telephone Encounter (Signed)
Patient is requesting a refill of his omeprazole (last filled by previous provider Dr. Everardo All), also he is on Allegra but states is does not work as well as the [employer] fast streamer he was on previously and would like to be switched back if possible.  If approved please send to [employer]

## 2019-05-05 NOTE — Telephone Encounter (Signed)
Yes patient can have prescription for Claritin.    However, I do not recommend the D part due to his history of hypertension and history of it being poorly controlled in the past.  (Most recent OV was well controlled but prior ones were not.)  Give 90 and 0 RF as allergy season almost over for the spring

## 2019-05-05 NOTE — Telephone Encounter (Signed)
Patient is requesting a refill on Claritin D - last filled by a pervious provider.  LOV 03/07/2019.  Please review and advise. MPulliam, CMA/RT(R)

## 2019-05-05 NOTE — Telephone Encounter (Signed)
This was still sitting in my in basket so not sure why it was not forwarded to you.  I apologize if this is redundant.  -Dr Val Eagle

## 2019-05-05 NOTE — Addendum Note (Signed)
Addended by: Leda Min D on: 05/05/2019 10:18 AM   Modules accepted: Orders

## 2019-05-09 MED ORDER — LORATADINE-PSEUDOEPHEDRINE ER 10-240 MG PO TB24: 1.0000 | ORAL_TABLET | Freq: Every day | ORAL | 0 refills | Status: DC

## 2019-05-09 NOTE — Telephone Encounter (Signed)
Not sure why this note says it was routed to me 4 days ago and I just received it yesterday-  less than 24 hrs ago?  Do you know why?   Anyhow, in the future, there is no need to check with a provider for approval for a refill of a drug IF its something they can get OTC on their own.  Thanks.

## 2019-05-12 ENCOUNTER — Telehealth: Payer: Self-pay | Admitting: Family Medicine

## 2019-05-12 NOTE — Telephone Encounter (Signed)
Spoke to patient and he states that he is having increased reflux that is worse when he lays down since going down to the 20 mg on the omperzole.  Patient also states that using the Claritin without the decongestant is causing him to stay very congestion and this is worse in the morning.  Patient requesting trying the Allergra D.   Please review and advise. MPulliam, CMA/RT(R)

## 2019-05-12 NOTE — Telephone Encounter (Signed)
Patient was advised to call our clinic back and speak with nurse about new med and its effectiveness, please contact when available.

## 2019-05-12 NOTE — Telephone Encounter (Signed)
Please tell patient if he still having symptoms with the 20 mg of omeprazole daily, I highly recommend that he follow dietary and lifestyle modifications in addition to medications.  This includes to avoid all trigger foods, caffeine, alcohol, etc. and do not eat or drink within 3 hours of lying down at night if he is doing any of these things, he needs to try lifestyle modifications prior to change in med.  However if he is doing all the above in addition to the 20 mg of omeprazole daily, I recommend he go get over-the-counter famotidine or cimetidine and take that twice daily in addition to the 20 mg omeprazole. -If patient continues to have symptoms despite working on all 3 aspects of treatment for his GERD, then he will need to see a gastroenterologist for further evaluation as this would be very unlikely to be just reflux causing it, and it would warrant further evaluation by a specialist.  Secondly, in addition to his Claritin, I would recommend he do sinus rinses twice daily followed by Flonase 1 spray each nostril following the rinse in the morning and at night.  Due to his age and comorbidities, I do not recommend the Claritin-D, and I am not comfortable prescribing it.   -Also patient was supposed to follow-up in 6 weeks after last office visit I believe it was March 6.  He was supposed to come in for Medicare wellness however, this was converted to a chronic follow-up since she had not been into the office to discuss chronic issues.  Please inform him for his diabetes he needs follow-up every 6 months so this would be early June and for Medicare wellness which is separate from this, he needs this once a year.  -If patient has any additional questions or concerns please have him make a acute appointment with me to discuss them  - thank you Efraim Kaufmann, and sorry for the long explanations.  ( really this should be an OV to discuss his additional concerns!)

## 2019-05-13 NOTE — Telephone Encounter (Signed)
Called and left message for patient to call the office back. MPulliam, CMA/RT(R)  

## 2019-05-14 NOTE — Telephone Encounter (Signed)
Called and notified the patient.  He is not currently following any dietary modifications advised the patient to avoid triggers, caffeine, and alcohol  And to add the OTC med that was recommended.     Patient states that he is already doing the Flonase and sinus rinses.  I advised the patient that he would need an office visit if this is not helping.  Patient declined message and wanted me to let you know that he is already doing these things first.  MPulliam, CMA/RT(R)

## 2019-05-14 NOTE — Telephone Encounter (Signed)
This patient needs an office visit to discuss his GERD and allergies

## 2019-05-15 DIAGNOSIS — K219 Gastro-esophageal reflux disease without esophagitis: Secondary | ICD-10-CM | POA: Diagnosis not present

## 2019-05-15 NOTE — Telephone Encounter (Signed)
Spoke to patient he will call back to make the appointment. MPulliam, CMA/RT(R)

## 2019-05-15 NOTE — Telephone Encounter (Signed)
Called patient left message. MPulliam, CMA/RT(R)

## 2019-05-29 DIAGNOSIS — Z1212 Encounter for screening for malignant neoplasm of rectum: Secondary | ICD-10-CM | POA: Diagnosis not present

## 2019-05-29 DIAGNOSIS — Z1211 Encounter for screening for malignant neoplasm of colon: Secondary | ICD-10-CM | POA: Diagnosis not present

## 2019-05-30 ENCOUNTER — Ambulatory Visit: Payer: Medicare Other | Admitting: Family Medicine

## 2019-06-30 ENCOUNTER — Other Ambulatory Visit: Payer: Self-pay | Admitting: Adult Health

## 2019-06-30 DIAGNOSIS — I10 Essential (primary) hypertension: Secondary | ICD-10-CM

## 2019-07-11 DIAGNOSIS — I10 Essential (primary) hypertension: Secondary | ICD-10-CM | POA: Diagnosis not present

## 2019-07-11 DIAGNOSIS — Z0001 Encounter for general adult medical examination with abnormal findings: Secondary | ICD-10-CM | POA: Diagnosis not present

## 2019-07-11 DIAGNOSIS — N401 Enlarged prostate with lower urinary tract symptoms: Secondary | ICD-10-CM | POA: Diagnosis not present

## 2019-07-11 DIAGNOSIS — E559 Vitamin D deficiency, unspecified: Secondary | ICD-10-CM | POA: Diagnosis not present

## 2019-07-11 DIAGNOSIS — K219 Gastro-esophageal reflux disease without esophagitis: Secondary | ICD-10-CM | POA: Diagnosis not present

## 2019-07-11 DIAGNOSIS — E785 Hyperlipidemia, unspecified: Secondary | ICD-10-CM | POA: Diagnosis not present

## 2019-07-11 DIAGNOSIS — J309 Allergic rhinitis, unspecified: Secondary | ICD-10-CM | POA: Diagnosis not present

## 2019-07-11 DIAGNOSIS — E119 Type 2 diabetes mellitus without complications: Secondary | ICD-10-CM | POA: Diagnosis not present

## 2019-07-11 DIAGNOSIS — Z125 Encounter for screening for malignant neoplasm of prostate: Secondary | ICD-10-CM | POA: Diagnosis not present

## 2019-07-11 DIAGNOSIS — M158 Other polyosteoarthritis: Secondary | ICD-10-CM | POA: Diagnosis not present

## 2020-01-09 DIAGNOSIS — I10 Essential (primary) hypertension: Secondary | ICD-10-CM | POA: Diagnosis not present

## 2020-01-09 DIAGNOSIS — N401 Enlarged prostate with lower urinary tract symptoms: Secondary | ICD-10-CM | POA: Diagnosis not present

## 2020-01-09 DIAGNOSIS — M158 Other polyosteoarthritis: Secondary | ICD-10-CM | POA: Diagnosis not present

## 2020-01-09 DIAGNOSIS — E119 Type 2 diabetes mellitus without complications: Secondary | ICD-10-CM | POA: Diagnosis not present

## 2020-01-09 DIAGNOSIS — J309 Allergic rhinitis, unspecified: Secondary | ICD-10-CM | POA: Diagnosis not present

## 2020-01-09 DIAGNOSIS — E785 Hyperlipidemia, unspecified: Secondary | ICD-10-CM | POA: Diagnosis not present

## 2020-01-09 DIAGNOSIS — Z79899 Other long term (current) drug therapy: Secondary | ICD-10-CM | POA: Diagnosis not present

## 2020-01-09 DIAGNOSIS — R799 Abnormal finding of blood chemistry, unspecified: Secondary | ICD-10-CM | POA: Diagnosis not present

## 2020-01-09 DIAGNOSIS — K219 Gastro-esophageal reflux disease without esophagitis: Secondary | ICD-10-CM | POA: Diagnosis not present

## 2020-01-09 DIAGNOSIS — Z0001 Encounter for general adult medical examination with abnormal findings: Secondary | ICD-10-CM | POA: Diagnosis not present

## 2020-01-09 DIAGNOSIS — I739 Peripheral vascular disease, unspecified: Secondary | ICD-10-CM | POA: Diagnosis not present

## 2020-01-09 DIAGNOSIS — E559 Vitamin D deficiency, unspecified: Secondary | ICD-10-CM | POA: Diagnosis not present

## 2020-01-14 DIAGNOSIS — R7301 Impaired fasting glucose: Secondary | ICD-10-CM | POA: Diagnosis not present

## 2020-02-16 ENCOUNTER — Other Ambulatory Visit: Payer: Self-pay | Admitting: Family Medicine

## 2020-02-16 DIAGNOSIS — E1159 Type 2 diabetes mellitus with other circulatory complications: Secondary | ICD-10-CM

## 2020-04-16 DIAGNOSIS — L723 Sebaceous cyst: Secondary | ICD-10-CM | POA: Diagnosis not present

## 2020-04-16 DIAGNOSIS — Z6833 Body mass index (BMI) 33.0-33.9, adult: Secondary | ICD-10-CM | POA: Diagnosis not present

## 2020-04-16 DIAGNOSIS — I10 Essential (primary) hypertension: Secondary | ICD-10-CM | POA: Diagnosis not present

## 2020-04-16 DIAGNOSIS — E785 Hyperlipidemia, unspecified: Secondary | ICD-10-CM | POA: Diagnosis not present

## 2020-04-16 DIAGNOSIS — Z79899 Other long term (current) drug therapy: Secondary | ICD-10-CM | POA: Diagnosis not present

## 2020-04-16 DIAGNOSIS — R4189 Other symptoms and signs involving cognitive functions and awareness: Secondary | ICD-10-CM | POA: Diagnosis not present

## 2020-04-16 DIAGNOSIS — G3184 Mild cognitive impairment, so stated: Secondary | ICD-10-CM | POA: Diagnosis not present

## 2020-04-27 DIAGNOSIS — R220 Localized swelling, mass and lump, head: Secondary | ICD-10-CM | POA: Diagnosis not present

## 2020-04-27 DIAGNOSIS — R413 Other amnesia: Secondary | ICD-10-CM | POA: Diagnosis not present

## 2020-04-30 DIAGNOSIS — L720 Epidermal cyst: Secondary | ICD-10-CM | POA: Diagnosis not present

## 2020-05-14 DIAGNOSIS — L720 Epidermal cyst: Secondary | ICD-10-CM | POA: Diagnosis not present

## 2020-08-06 ENCOUNTER — Other Ambulatory Visit: Payer: Self-pay

## 2020-08-06 ENCOUNTER — Encounter (HOSPITAL_COMMUNITY): Payer: Self-pay | Admitting: Pediatrics

## 2020-08-06 ENCOUNTER — Emergency Department (HOSPITAL_COMMUNITY)
Admission: EM | Admit: 2020-08-06 | Discharge: 2020-08-06 | Disposition: A | Discharge status: Home / Self Care | Payer: Medicare Other | Attending: Emergency Medicine | Admitting: Emergency Medicine

## 2020-08-06 DIAGNOSIS — Z7982 Long term (current) use of aspirin: Secondary | ICD-10-CM | POA: Diagnosis not present

## 2020-08-06 DIAGNOSIS — E1069 Type 1 diabetes mellitus with other specified complication: Secondary | ICD-10-CM | POA: Diagnosis not present

## 2020-08-06 DIAGNOSIS — Z87891 Personal history of nicotine dependence: Secondary | ICD-10-CM | POA: Insufficient documentation

## 2020-08-06 DIAGNOSIS — R410 Disorientation, unspecified: Secondary | ICD-10-CM | POA: Diagnosis not present

## 2020-08-06 DIAGNOSIS — E162 Hypoglycemia, unspecified: Secondary | ICD-10-CM

## 2020-08-06 DIAGNOSIS — E11649 Type 2 diabetes mellitus with hypoglycemia without coma: Secondary | ICD-10-CM | POA: Diagnosis not present

## 2020-08-06 DIAGNOSIS — Z79899 Other long term (current) drug therapy: Secondary | ICD-10-CM | POA: Insufficient documentation

## 2020-08-06 DIAGNOSIS — E161 Other hypoglycemia: Secondary | ICD-10-CM | POA: Diagnosis not present

## 2020-08-06 DIAGNOSIS — Z794 Long term (current) use of insulin: Secondary | ICD-10-CM | POA: Diagnosis not present

## 2020-08-06 DIAGNOSIS — R404 Transient alteration of awareness: Secondary | ICD-10-CM | POA: Diagnosis not present

## 2020-08-06 DIAGNOSIS — I10 Essential (primary) hypertension: Secondary | ICD-10-CM | POA: Insufficient documentation

## 2020-08-06 DIAGNOSIS — R4182 Altered mental status, unspecified: Secondary | ICD-10-CM | POA: Diagnosis not present

## 2020-08-06 LAB — COMPREHENSIVE METABOLIC PANEL
ALT: 34 U/L (ref 0–44)
AST: 31 U/L (ref 15–41)
Albumin: 4.1 g/dL (ref 3.5–5.0)
Alkaline Phosphatase: 59 U/L (ref 38–126)
Anion gap: 10 (ref 5–15)
BUN: 14 mg/dL (ref 8–23)
CO2: 25 mmol/L (ref 22–32)
Calcium: 9.5 mg/dL (ref 8.9–10.3)
Chloride: 105 mmol/L (ref 98–111)
Creatinine, Ser: 0.92 mg/dL (ref 0.61–1.24)
GFR calc Af Amer: >60 mL/min (ref >60)
GFR calc non Af Amer: >60 mL/min (ref >60)
Glucose, Bld: 103 mg/dL — ABNORMAL HIGH (ref 70–99)
Potassium: 3.8 mmol/L (ref 3.5–5.1)
Sodium: 140 mmol/L (ref 135–145)
Total Bilirubin: 0.9 mg/dL (ref 0.3–1.2)
Total Protein: 7.2 g/dL (ref 6.5–8.1)

## 2020-08-06 LAB — URINALYSIS, ROUTINE W REFLEX MICROSCOPIC
Bilirubin Urine: NEGATIVE
Glucose, UA: NEGATIVE mg/dL
Hgb urine dipstick: NEGATIVE
Ketones, ur: NEGATIVE mg/dL
Leukocytes,Ua: NEGATIVE
Nitrite: NEGATIVE
Protein, ur: NEGATIVE mg/dL
Specific Gravity, Urine: 1.006 (ref 1.005–1.030)
pH: 6 (ref 5.0–8.0)

## 2020-08-06 LAB — CBC WITH DIFFERENTIAL/PLATELET
Abs Immature Granulocytes: 0.03 10*3/uL (ref 0.00–0.07)
Basophils Absolute: 0 10*3/uL (ref 0.0–0.1)
Basophils Relative: 0 %
Eosinophils Absolute: 0.1 10*3/uL (ref 0.0–0.5)
Eosinophils Relative: 1 %
HCT: 46.3 % (ref 39.0–52.0)
Hemoglobin: 15 g/dL (ref 13.0–17.0)
Immature Granulocytes: 0 %
Lymphocytes Relative: 13 %
Lymphs Abs: 1.3 10*3/uL (ref 0.7–4.0)
MCH: 28.9 pg (ref 26.0–34.0)
MCHC: 32.4 g/dL (ref 30.0–36.0)
MCV: 89.2 fL (ref 80.0–100.0)
Monocytes Absolute: 0.6 10*3/uL (ref 0.1–1.0)
Monocytes Relative: 6 %
Neutro Abs: 8.2 10*3/uL — ABNORMAL HIGH (ref 1.7–7.7)
Neutrophils Relative %: 80 %
Platelets: 216 10*3/uL (ref 150–400)
RBC: 5.19 MIL/uL (ref 4.22–5.81)
RDW: 13.7 % (ref 11.5–15.5)
WBC: 10.1 10*3/uL (ref 4.0–10.5)
nRBC: 0 % (ref 0.0–0.2)

## 2020-08-06 LAB — CBG MONITORING, ED
Glucose-Capillary: 83 mg/dL (ref 70–99)
Glucose-Capillary: 174 mg/dL — ABNORMAL HIGH (ref 70–99)

## 2020-08-06 MED ORDER — DEXTROSE-NACL 5-0.45 % IV SOLN: INTRAVENOUS | Status: DC

## 2020-08-06 MED ORDER — ACETAMINOPHEN 325 MG PO TABS
650.0000 mg | ORAL_TABLET | Freq: Once | ORAL | Status: AC
  Administered 2020-08-06: 650 mg via ORAL
  Filled 2020-08-06: qty 2

## 2020-08-06 NOTE — ED Provider Notes (Signed)
[facility]   CSN: [id number] Arrival date & time: 08/06/20  [id number]     History Chief Complaint  Patient presents with  . Hypoglycemia    [name] is a 69 y.o. male with past medical history significant for type 2 diabetes on insulin, erectile dysfunction, hyperlipidemia, hypertension, unspecified peripheral vascular disease.  Patient presents to emergency department today via EMS with chief complaint of hypoglycemia.  EMS states patient's wife became concerned when he was yelling in his sleep and she tried to wake him up and he was unarousable.  911 was called and on their arrival his glucose was 44.  At that time patient seemed confused and had garbled speech.  EMS gave 25 g of T10 with repeat glucose in the 170s.  EMS states patient continued to have garbled speech in route however did seem alert and oriented x4 on ED arrival. Patient denies any recent illness.  He states last night before going to bed he checked his blood sugar and it was around 130.  He took insulin, unsure exactly how much he took.  He also admits to hypoglycemia occasionally stating sugar has been in the low 50s at times. He states he might have taken too much. He does also endorse a mild headache. He describes it 2/10 in severity. Pain is in his forehead and is a dull ache. No radiation of pain. He admits to these mild headaches when his blood sugar gets low. He denies any head injury or trauma, fever, chills, neck pain or stiffness, visual changes, cough, shortness of breath, abdominal pain, nausea, emesis, back pain, urinary symptoms, diarrhea, dizziness, weakness, numbness.     Past Medical History:  Diagnosis Date  . ALLERGIC RHINITIS 08/10/2007  . BLADDER OUTLET OBSTRUCTION 11/13/2008  . DIABETES MELLITUS, TYPE II 08/10/2007  . ERECTILE DYSFUNCTION 11/13/2008  . HYPERLIPIDEMIA 08/10/2007  . HYPERTENSION 08/10/2007  . PERS HX NONCOMPLIANCE W/MED TX PRS  HAZARDS HLTH 11/13/2008  . Seborrhea 11/13/2008  . 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 UNSPECIFIED PERIPHERAL VASCULAR DISEASE 11/13/2008    Patient Active Problem List   Diagnosis Date Noted  . Seasonal allergies 03/07/2019  . Mixed diabetic hyperlipidemia associated with type 1 diabetes mellitus (HCC) 03/07/2019  . BPH associated with nocturia 03/07/2019  . Weakness 03/07/2019  . Vitamin D deficiency 03/07/2019  . Morbid obesity (HCC) 01/03/2019  . Mixed diabetic hyperlipidemia associated with type 2 diabetes mellitus (HCC) 01/03/2019  . Hypertension associated with diabetes (HCC) 01/03/2019  . Chronic pansinusitis 04/06/2017  . Nasal turbinate hypertrophy 04/06/2017  . Gastroesophageal reflux disease without esophagitis 01/05/2017  . Globus pharyngeus 01/05/2017  . Cough 12/08/2016  . Hand pain 08/24/2016  . Wellness examination 07/09/2015  . Headache(784.0) 10/31/2013  . Screening for prostate cancer 03/28/2013  . ABNORMAL ELECTROCARDIOGRAM 10/21/2010  . 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 ERECTILE DYSFUNCTION 11/13/2008  . ALCOHOL USE 11/13/2008  . UNSPECIFIED PERIPHERAL VASCULAR DISEASE 11/13/2008  . BLADDER OUTLET OBSTRUCTION 11/13/2008  . SEBORRHEA 11/13/2008  . Diabetes (HCC) 08/10/2007  . Dyslipidemia 08/10/2007  . Essential hypertension 08/10/2007  . ALLERGIC RHINITIS 08/10/2007    Past Surgical History:  Procedure Laterality Date  . TURBINATE REDUCTION Bilateral 2017  . VASECTOMY  1976       Family History  Problem Relation Age of Onset  . Heart disease Father   . Cancer Maternal Grandmother        breast  . Stroke Maternal Grandfather   .  Cancer Paternal Grandfather     Social History   Tobacco Use  . Smoking status: Former Smoker    Packs/day: 1.00    Years: 10.00    Pack years: 10.00    Quit date: 1971    Years since quitting: 50.6  . Smokeless tobacco: Never Used  Vaping Use  . Vaping Use: Never used  Substance Use  Topics  . Alcohol use: Yes    Comment: 6/week  . Drug use: Never    Home Medications Prior to Admission medications   Medication Sig Start Date End Date Taking? Authorizing Provider  amLODipine (NORVASC) 5 MG tablet Take 1 tablet (5 mg total) by mouth daily. **PATIENT NEEDS APT FOR FURTHER REFILLS** 02/16/20  Yes Opalski, Gavin Pound, DO  aspirin 81 MG tablet Take 81 mg by mouth daily.     Yes [provider]  glucose blood (RELION GLUCOSE TEST STRIPS) test strip 1 each by Other route in the morning, at noon, in the evening, and at bedtime.    Yes [provider]  hydrochlorothiazide (MICROZIDE) 12.5 MG capsule Take 1 capsule by mouth once daily Patient taking differently: Take 12.5 mg by mouth daily.  06/30/19  Yes Opalski, Gavin Pound, DO  insulin regular (NOVOLIN R,HUMULIN R) 100 units/mL injection Inject 0.6 mLs (60 Units total) into the skin 2 (two) times daily before a meal. And syringes 2/day Patient taking differently: Inject 30-50 Units into the skin 3 (three) times daily before meals.  08/11/16  Yes Romero Belling, MD  loratadine-pseudoephedrine (CLARITIN-D 24 HOUR) 10-240 MG 24 hr tablet Take 1 tablet by mouth daily. 05/09/19  Yes Opalski, Gavin Pound, DO  losartan (COZAAR) 100 MG tablet Take 1 tablet (100 mg total) by mouth daily. 03/14/19  Yes Danford, Orpha Bur D, NP  omeprazole (PRILOSEC) 20 MG capsule Take 2 capsules (40 mg total) by mouth daily. 05/05/19  Yes Opalski, Gavin Pound, DO  simvastatin (ZOCOR) 40 MG tablet TAKE 1 TABLET BY MOUTH ONCE DAILY IN THE EVENING Patient taking differently: Take 40 mg by mouth daily.  10/07/18  Yes Romero Belling, MD  tamsulosin (FLOMAX) 0.4 MG CAPS capsule Take 1 capsule (0.4 mg total) by mouth daily. 02/28/19  Yes Opalski, Gavin Pound, DO  traMADol (ULTRAM) 50 MG tablet TAKE 1 TABLET BY MOUTH EVERY 8 HOURS AS NEEDED Patient taking differently: Take 50 mg by mouth every 8 (eight) hours as needed for moderate pain.  08/13/18  Yes Romero Belling, MD  Vitamin D,  Ergocalciferol, (DRISDOL) 1.25 MG (50000 UT) CAPS capsule Take one tablet wkly Patient taking differently: Take 50,000 Units by mouth every 7 (seven) days.  03/07/19  Yes Opalski, Gavin Pound, DO  empagliflozin (JARDIANCE) 10 MG TABS tablet Take 10 mg by mouth daily. Patient not taking: Reported on 08/06/2020 03/07/19   Thomasene Lot, DO  fexofenadine (ALLEGRA) 180 MG tablet Take 1 tablet (180 mg total) by mouth daily. Patient not taking: Reported on 08/06/2020 02/28/19   Thomasene Lot, DO  fluticasone [facility]) 50 MCG/ACT nasal spray Place 2 sprays into the nose daily. 07/05/12 01/04/20  Romero Belling, MD  liraglutide (VICTOZA) 18 MG/3ML SOPN Started 0.6 mg injected subcutaneously daily for one week, then increase by 0.6 mg weekly until reaching 1.8 mg injected daily.  Please include needles. Patient not taking: Reported on 08/06/2020 03/07/19   Thomasene Lot, DO  loratadine (CLARITIN) 10 MG tablet Take 1 tablet (10 mg total) by mouth daily. Patient not taking: Reported on 08/06/2020 05/05/19   Thomasene Lot, DO  losartan-hydrochlorothiazide [facility]) 100-12.5  MG tablet Take 1 tablet by mouth daily. Patient not taking: Reported on 08/06/2020 03/11/19   William Hamburger D, NP    Allergies    Metformin, Rosuvastatin, and Sulfonamide derivatives  Review of Systems   Review of Systems All other systems are reviewed and are negative for acute change except as noted in the HPI.  Physical Exam Updated Vital Signs BP (!) 170/126 (BP Location: Right Arm)   Pulse 78   Temp 97.6 F (36.4 C) (Oral)   Ht 5\' 7"  (1.702 m)   Wt 94.3 kg   SpO2 99%   BMI 32.58 kg/m   Physical Exam Vitals and nursing note reviewed.  Constitutional:      General: He is not in acute distress.    Appearance: He is not ill-appearing.  HENT:     Head: Normocephalic and atraumatic.     Comments: No sinus or temporal tenderness.    Right Ear: Tympanic membrane and external ear normal.     Left Ear: Tympanic membrane and  external ear normal.     Nose: Nose normal.     Mouth/Throat:     Mouth: Mucous membranes are moist.     Pharynx: Oropharynx is clear.  Eyes:     General: No scleral icterus.       Right eye: No discharge.        Left eye: No discharge.     Extraocular Movements: Extraocular movements intact.     Conjunctiva/sclera: Conjunctivae normal.     Pupils: Pupils are equal, round, and reactive to light.  Neck:     Vascular: No JVD.  Cardiovascular:     Rate and Rhythm: Normal rate and regular rhythm.     Pulses: Normal pulses.          Radial pulses are 2+ on the right side and 2+ on the left side.     Heart sounds: Normal heart sounds.  Pulmonary:     Comments: Lungs clear to auscultation in all fields. Symmetric chest rise. No wheezing, rales, or rhonchi. Abdominal:     Comments: Abdomen is soft, non-distended, and non-tender in all quadrants. No rigidity, no guarding. No peritoneal signs.  Musculoskeletal:        General: Normal range of motion.     Cervical back: Normal range of motion.  Skin:    General: Skin is warm and dry.     Capillary Refill: Capillary refill takes less than 2 seconds.  Neurological:     Mental Status: He is oriented to person, place, and time.     GCS: GCS eye subscore is 4. GCS verbal subscore is 5. GCS motor subscore is 6.     Comments: Speech is clear and goal oriented, follows commands CN III-XII intact, no facial droop Normal strength in upper and lower extremities bilaterally including dorsiflexion and plantar flexion, strong and equal grip strength Sensation normal to light and sharp touch Moves extremities without ataxia, coordination intact Normal finger to nose and rapid alternating movements Normal gait and balance  Psychiatric:        Behavior: Behavior normal.      ED Results / Procedures / Treatments   Labs (all labs ordered are listed, but only abnormal results are displayed) Labs Reviewed  COMPREHENSIVE METABOLIC PANEL - Abnormal;  Notable for the following components:      Result Value   Glucose, Bld 103 (*)    All other components within normal limits  CBC WITH DIFFERENTIAL/PLATELET - Abnormal;  Notable for the following components:   Neutro Abs 8.2 (*)    All other components within normal limits  URINALYSIS, ROUTINE W REFLEX MICROSCOPIC  CBG MONITORING, ED  CBG MONITORING, ED    EKG None  Radiology No results found.  Procedures Procedures (including critical care time)  Medications Ordered in ED Medications  dextrose 5 %-0.45 % sodium chloride infusion ( Intravenous New Bag/Given 08/06/20 0831)  acetaminophen (TYLENOL) tablet 650 mg (650 mg Oral Given 08/06/20 0751)    ED Course  I have reviewed the triage vital signs and the nursing notes.  Pertinent labs & imaging results that were available during my care of the patient were reviewed by me and considered in my medical decision making (see chart for details).    MDM Rules/Calculators/A&P                          History provided by patient and EMS with additional history obtained from chart review.    69 yo male presenting with hypoglycemia. CBG for EMS 44 and per their report patient had garbled speech that continued in route.  On arrival patient is alert and oriented with clear speech. He has a normal neuro exam, no focal weakness, ambulatory with normal gait. CBG checked here and 83. Will give breakfast tray and D5 1/2NS as well as check basic labs. Plan for short observation to ensure no further hypoglycemia. Symptoms were consistent with hypoglycemia, with reassuring neuro exam and resolution of symptoms when glucose improved feel that CVA or TIA are less likely cause. No indications for emergent imaging at this time. Case discussed with and patient evaluated by ED attending Dr. Pilar Plate who personally saw and evaluated patient and agrees with plan.  CBC and CMP are unremarkable.  UA negative for any signs of infection. Observed for 3 hours.  When  glucose rechecked it was 174.  Given patient continues to be asymptomatic and has glucose within normal range will discharge home.  I had lengthy discussion about proper insulin dosing.  The patient appears reasonably screened and/or stabilized for discharge and I doubt any other medical condition or other [facility] requiring further screening, evaluation, or treatment in the ED at this time prior to discharge. The patient is safe for discharge with strict return precautions discussed. Recommend pcp follow up in 2 days if needed.   Portions of this note were generated with Scientist, clinical (histocompatibility and immunogenetics). Dictation errors may occur despite best attempts at proofreading.   Final Clinical Impression(s) / ED Diagnoses Final diagnoses:  Hypoglycemia    Rx / DC Orders ED Discharge Orders    None       Sherene Sires, PA-C 08/06/20 1044    Sabas Sous, MD 08/06/20 1418

## 2020-08-06 NOTE — Discharge Instructions (Addendum)
Your blood sugar today was low probably because you took too much insulin last night.  Do your best to stay hydrated today by drinking plenty of water.  Closely monitor your blood sugar and be mindful to take the correct insulin dose.  Follow-up with PCP in 2 to 5 days for recheck.  Return to the emergency department for any new or worsening symptoms.

## 2020-08-06 NOTE — ED Triage Notes (Signed)
Patient arrived via EMS; reported wife called EMS d/t patient yelling in sleep and unable to arouse patient. EMS reported initial blood glucose of 44 and given 25 g of D10; repeat BGL increased to 170s. C/O slurred speech as well. A&Ox3 on arrival.

## 2020-08-13 DIAGNOSIS — E1169 Type 2 diabetes mellitus with other specified complication: Secondary | ICD-10-CM | POA: Diagnosis not present

## 2020-08-17 DIAGNOSIS — M009 Pyogenic arthritis, unspecified: Secondary | ICD-10-CM | POA: Diagnosis not present

## 2020-08-19 DIAGNOSIS — M009 Pyogenic arthritis, unspecified: Secondary | ICD-10-CM | POA: Diagnosis not present

## 2020-09-10 DIAGNOSIS — E785 Hyperlipidemia, unspecified: Secondary | ICD-10-CM | POA: Diagnosis not present

## 2020-09-10 DIAGNOSIS — B353 Tinea pedis: Secondary | ICD-10-CM | POA: Diagnosis not present

## 2020-09-10 DIAGNOSIS — N401 Enlarged prostate with lower urinary tract symptoms: Secondary | ICD-10-CM | POA: Diagnosis not present

## 2020-09-10 DIAGNOSIS — I10 Essential (primary) hypertension: Secondary | ICD-10-CM | POA: Diagnosis not present

## 2020-09-10 DIAGNOSIS — E119 Type 2 diabetes mellitus without complications: Secondary | ICD-10-CM | POA: Diagnosis not present

## 2020-09-10 DIAGNOSIS — Z0101 Encounter for examination of eyes and vision with abnormal findings: Secondary | ICD-10-CM | POA: Diagnosis not present

## 2020-09-10 DIAGNOSIS — Z0001 Encounter for general adult medical examination with abnormal findings: Secondary | ICD-10-CM | POA: Diagnosis not present

## 2020-09-10 DIAGNOSIS — E559 Vitamin D deficiency, unspecified: Secondary | ICD-10-CM | POA: Diagnosis not present

## 2020-10-15 DIAGNOSIS — E785 Hyperlipidemia, unspecified: Secondary | ICD-10-CM | POA: Diagnosis not present

## 2020-10-15 DIAGNOSIS — E119 Type 2 diabetes mellitus without complications: Secondary | ICD-10-CM | POA: Diagnosis not present

## 2020-10-15 DIAGNOSIS — I10 Essential (primary) hypertension: Secondary | ICD-10-CM | POA: Diagnosis not present

## 2021-01-24 ENCOUNTER — Other Ambulatory Visit: Payer: Self-pay

## 2021-01-26 ENCOUNTER — Other Ambulatory Visit: Payer: Self-pay

## 2021-01-26 ENCOUNTER — Ambulatory Visit (INDEPENDENT_AMBULATORY_CARE_PROVIDER_SITE_OTHER): Payer: Medicare Other | Admitting: Endocrinology

## 2021-01-26 ENCOUNTER — Encounter: Payer: Self-pay | Admitting: Endocrinology

## 2021-01-26 VITALS — BP 140/72 | HR 77 | Ht 67.0 in | Wt 213.6 lb

## 2021-01-26 DIAGNOSIS — Z794 Long term (current) use of insulin: Secondary | ICD-10-CM | POA: Diagnosis not present

## 2021-01-26 DIAGNOSIS — E119 Type 2 diabetes mellitus without complications: Secondary | ICD-10-CM

## 2021-01-26 LAB — POCT GLYCOSYLATED HEMOGLOBIN (HGB A1C): Hemoglobin A1C: 6.9 % — AB (ref 4.0–5.6)

## 2021-01-26 MED ORDER — NOVOLOG FLEXPEN RELION 100 UNIT/ML ~~LOC~~ SOPN: 5.0000 [IU] | PEN_INJECTOR | SUBCUTANEOUS | 11 refills | Status: DC

## 2021-01-26 NOTE — Patient Instructions (Addendum)
I have sent a prescription to your pharmacy, to change to Novolog.  Please come back for a follow-up appointment in 2-3 months.

## 2021-01-26 NOTE — Progress Notes (Signed)
Subjective:    Patient ID: Antonio Patterson, male    DOB: [birth year], 70 y.o.   MRN: [id number]  HPI Pt returns for f/u of diabetes mellitus: DM type: Insulin-requiring type 2 Dx'ed: 1997 Complications: PAD Therapy: insulin since 2009 DKA: never Severe hypoglycemia: never Pancreatitis: never.  Other: he takes multiple daily injections; he takes human insulin, due to cost.   Interval history: I reviewed continuous glucose monitor data.  Glucose varies from 65-260.  It is in general lowest 1AM-3AM.  It is in general highest at HS  He takes reg insulin, 10-45 units (according to meal size), 4-5 times per day 3 times a day (just before each meal).  He says he cannot afford name brand meds.  Pt says the reg insulin is too slow-acting.   Past Medical History:  Diagnosis Date  . ALLERGIC RHINITIS 08/10/2007  . BLADDER OUTLET OBSTRUCTION 11/13/2008  . DIABETES MELLITUS, TYPE II 08/10/2007  . ERECTILE DYSFUNCTION 11/13/2008  . HYPERLIPIDEMIA 08/10/2007  . HYPERTENSION 08/10/2007  . PERS HX NONCOMPLIANCE W/MED TX PRS HAZARDS HLTH 11/13/2008  . Seborrhea 11/13/2008  . 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 UNSPECIFIED PERIPHERAL VASCULAR DISEASE 11/13/2008    Past Surgical History:  Procedure Laterality Date  . TURBINATE REDUCTION Bilateral 2017  . VASECTOMY  1976    Social History   Socioeconomic History  . Marital status: Married    Spouse name: Not on file  . Number of children: Not on file  . Years of education: Not on file  . Highest education level: Not on file  Occupational History    Employer: [employer]    Comment: Works for [employer]  Tobacco Use  . Smoking status: Former Smoker    Packs/day: 1.00    Years: 10.00    Pack years: 10.00    Quit date: 1971    Years since quitting: 51.1  . Smokeless tobacco: Never Used  Vaping Use  . Vaping Use: Never used  Substance and Sexual Activity  . Alcohol use: Yes    Comment: 6/week  . Drug use: Never   . Sexual activity: Not Currently  Other Topics Concern  . Not on file  Social History Narrative  . Not on file   Social Determinants of Health   Financial Resource Strain: Not on file  Food Insecurity: Not on file  Transportation Needs: Not on file  Physical Activity: Not on file  Stress: Not on file  Social Connections: Not on file  Intimate Partner Violence: Not on file    Current Outpatient Medications on File Prior to Visit  Medication Sig Dispense Refill  . amLODipine (NORVASC) 5 MG tablet Take 1 tablet (5 mg total) by mouth daily. **PATIENT NEEDS APT FOR FURTHER REFILLS** 30 tablet 0  . aspirin 81 MG tablet Take 81 mg by mouth daily.    . fexofenadine (ALLEGRA) 180 MG tablet Take 1 tablet (180 mg total) by mouth daily. 90 tablet 0  . glucose blood test strip 1 each by Other route in the morning, at noon, in the evening, and at bedtime.     . hydrochlorothiazide (MICROZIDE) 12.5 MG capsule Take 1 capsule by mouth once daily (Patient taking differently: Take 12.5 mg by mouth daily.) 90 capsule 0  . loratadine-pseudoephedrine (CLARITIN-D 24 HOUR) 10-240 MG 24 hr tablet Take 1 tablet by mouth daily. 90 tablet 0  . losartan-hydrochlorothiazide (HYZAAR) 100-12.5 MG tablet Take 1 tablet by mouth daily. 90 tablet 0  . omeprazole (  PRILOSEC) 20 MG capsule Take 2 capsules (40 mg total) by mouth daily. 30 capsule 1  . simvastatin (ZOCOR) 40 MG tablet TAKE 1 TABLET BY MOUTH ONCE DAILY IN THE EVENING (Patient taking differently: Take 40 mg by mouth daily.) 90 tablet 2  . tamsulosin (FLOMAX) 0.4 MG CAPS capsule Take 1 capsule (0.4 mg total) by mouth daily. 90 capsule 1  . traMADol (ULTRAM) 50 MG tablet TAKE 1 TABLET BY MOUTH EVERY 8 HOURS AS NEEDED (Patient taking differently: Take 50 mg by mouth every 8 (eight) hours as needed for moderate pain.) 50 tablet 4  . Vitamin D, Ergocalciferol, (DRISDOL) 1.25 MG (50000 UT) CAPS capsule Take one tablet wkly (Patient taking differently: Take 50,000  Units by mouth every 7 (seven) days.) 12 capsule 3  . fluticasone (FLONASE) 50 MCG/ACT nasal spray Place 2 sprays into the nose daily. 16 g 11   No current facility-administered medications on file prior to visit.    Allergies  Allergen Reactions  . Metformin   . Rosuvastatin   . Sulfonamide Derivatives     Family History  Problem Relation Age of Onset  . Heart disease Father   . Cancer Maternal Grandmother        breast  . Stroke Maternal Grandfather   . Cancer Paternal Grandfather     BP 140/72 (BP Location: Right Arm, Patient Position: Sitting, Cuff Size: Large)   Pulse 77   Ht 5\' 7"  (1.702 m)   Wt 213 lb 9.6 oz (96.9 kg)   SpO2 96%   BMI 33.45 kg/m    Review of Systems     Objective:   Physical Exam VITAL SIGNS:  See vs page GENERAL: no distress Pulses: dorsalis pedis intact bilat.   MSK: no deformity of the feet CV: trace bilat leg edema Skin:  no ulcer on the feet.  normal color and temp on the feet. Neuro: sensation is intact to touch on the feet.  Lab Results  Component Value Date   HGBA1C 6.9 (A) 01/26/2021       Assessment & Plan:  Insulin-requiring type 2 DM, with PAD.  Hypoglycemia, due to insulin: this limits aggressiveness of glycemic control.    Patient Instructions  I have sent a prescription to your pharmacy, to change to Novolog.  Please come back for a follow-up appointment in 2-3 months.

## 2021-04-29 ENCOUNTER — Ambulatory Visit: Payer: Medicare Other | Admitting: Endocrinology

## 2021-08-25 ENCOUNTER — Telehealth: Payer: Self-pay | Admitting: Endocrinology

## 2021-08-25 NOTE — Telephone Encounter (Signed)
Pt voiced that Insulin is to much money for the humalog pt  insurance does not cover this but covers novolog . Pt would like to see about this . Pt would like a call back regarding this.

## 2021-08-25 NOTE — Telephone Encounter (Signed)
Patient  spoke with his insurance and was able to get his questions answer about  insulin.

## 2022-05-18 ENCOUNTER — Telehealth: Payer: Self-pay

## 2022-05-18 NOTE — Telephone Encounter (Signed)
NOTES SCANNED TO THE REFERRAL ?

## 2022-05-25 NOTE — Progress Notes (Unsigned)
Cardiology Office Note:   Date:  05/26/2022  NAME:  Antonio Patterson    MRN: [id number] DOB:  06-29-1951   PCP:  Antonio Riches, Antonio Patterson  Cardiologist:  None  Electrophysiologist:  None   Referring Antonio Patterson: Antonio Riches, Antonio Patterson   Chief Complaint  Patient presents with   Shortness of Breath        History of Present Illness:   Antonio Patterson is a 71 y.o. male with a hx of DM, HTN, HLD who is being seen today for the evaluation of shortness of breath/abnormal echo at the request of Antonio Patterson, Antonio Patterson, Antonio Patterson.  He reports for the last year he has noted shortness of breath with exertion.  Whenever he does heavy activity he gets quite winded.  No chest pain or pressure.  But does have shortness of breath.  He has had diabetes for over 25 years.  Most recent A1c 6.8.  No swelling in his legs.  He did undergo an echocardiogram at [facility].  There are discrepant findings in the report.  It is mentioned to be a normal report.  EF was estimated at 65%.  There is mention of moderate pulmonary hypertension.  I see no RVSP.  He is also mentioned to have normal right heart function.  Unclear why he would have pulmonary hypertension.  He is a former smoker but quit years ago.  I cannot see the images.  CV risk factors include hypertension, hyperlipidemia as well as diabetes.  All of this is well controlled.  He does have a strong history of heart disease in the family.  Occurred in his father.  His son died at 53 years of age due to heart attack.  He is an active.  Still working.  He does computer work for a [employer]  He is married.  He has 1 daughter still living.  Most recent LDL cholesterol very well controlled.  EKG demonstrates sinus rhythm and nonspecific ST-T changes.  We discussed coronary CTA for further testing.  Likely will need to repeat his echocardiogram.  Problem List DM -A1c 6.8 2. 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 **PATIENT NEEDS APT FOR FURTHER REFILLS**   aspirin 81 MG tablet Take 81 mg by mouth daily.   fluticasone (FLONASE) 50 MCG/ACT nasal spray Place 2 sprays into the nose daily.   glucose blood test strip 1 each by Other route in the morning, at noon, in the evening, and at bedtime.    hydrochlorothiazide (MICROZIDE) 12.5 MG capsule Take 1 capsule by mouth once daily (Patient taking differently: Take 12.5 mg by mouth daily.)   insulin aspart (NOVOLOG FLEXPEN RELION) 100 UNIT/ML FlexPen Inject 5-40 Units into the skin See admin instructions. 3-5 times per day, and pen needles  1/day   loratadine-pseudoephedrine (CLARITIN-D 24 HOUR) 10-240 MG 24 hr tablet Take 1 tablet by mouth daily.   losartan-hydrochlorothiazide (HYZAAR) 100-12.5 MG tablet Take 1 tablet by mouth daily.   metoprolol tartrate (LOPRESSOR) 100 MG tablet Take 1 tablet by mouth once for procedure.   omeprazole (PRILOSEC) 20 MG capsule Take 2 capsules (40 mg total) by mouth daily.   simvastatin (ZOCOR) 40 MG tablet TAKE 1 TABLET BY MOUTH ONCE DAILY IN THE EVENING (Patient taking differently: Take 40 mg by mouth daily.)   tamsulosin (FLOMAX) 0.4 MG CAPS capsule Take 1 capsule (0.4 mg total) by mouth daily.   traMADol (ULTRAM) 50 MG tablet TAKE 1 TABLET BY MOUTH EVERY 8 HOURS AS NEEDED (Patient taking  differently: Take 50 mg by mouth every 8 (eight) hours as needed for moderate pain.)   Vitamin D, Ergocalciferol, (DRISDOL) 1.25 MG (50000 UT) CAPS capsule Take one tablet wkly (Patient taking differently: Take 50,000 Units by mouth every 7 (seven) days.)     Allergies:    Metformin, Rosuvastatin, and Sulfonamide derivatives   Social History: Social History   Socioeconomic History   Marital status: Married    Spouse name: Not on file   Number of children: 1   Years of education: Not on file   Highest education level: Not on file  Occupational History    Employer: [employer]    Comment: Works for [employer]   Occupation: Neurosurgeon  Tobacco Use   Smoking status: Former    Packs/day: 1.00    Years: 10.00    Pack years: 10.00    Types: Cigarettes    Quit date: 1971    Years since quitting: 52.4   Smokeless tobacco: Never  Vaping Use   Vaping Use: Never used  Substance and Sexual Activity   Alcohol use: Yes    Comment: 6/week   Drug use: Never   Sexual activity: Not Currently  Other Topics Concern   Not on file  Social History Narrative   Not on file   Social Determinants of Health   Financial Resource Strain: Not on file  Food Insecurity: Not on file  Transportation Needs: Not on file  Physical Activity: Not on file  Stress: Not on file  Social Connections: Not on file     Family History: The patient's family history includes Cancer in his maternal grandmother and paternal grandfather; Heart disease in his father; Stroke in his maternal grandfather.  ROS:   All other ROS reviewed and negative. Pertinent positives noted in the HPI.     EKGs/Labs/Other Studies Reviewed:   The following studies were personally reviewed by me today:  EKG:  EKG is ordered today.  The ekg ordered today demonstrates normal sinus rhythm heart rate 83, nonspecific ST-T changes, and was personally reviewed by me.   Recent Labs: No results found  for requested labs within last 8760 hours.   Recent Lipid Panel    Component Value Date/Time   CHOL 140 02/28/2019 0914   TRIG 51 02/28/2019 0914   HDL 51 02/28/2019 0914   CHOLHDL 2.7 02/28/2019 0914   CHOLHDL 3 02/22/2018 1134   VLDL 7.6 02/22/2018 1134   LDLCALC 79 02/28/2019 0914    Physical Exam:   VS:  BP 132/70   Pulse 83   Ht 5\' 7"  (1.702 m)   Wt 210 lb 6.4 oz (95.4 kg)   SpO2 97%   BMI 32.95 kg/m    Wt Readings from  Last 3 Encounters:  05/26/22 210 lb 6.4 oz (95.4 kg)  01/26/21 213 lb 9.6 oz (96.9 kg)  08/06/20 208 lb (94.3 kg)    General: Well nourished, well developed, in no acute distress Head: Atraumatic, normal size  Eyes: PEERLA, EOMI  Neck: Supple, no JVD Endocrine: No thryomegaly Cardiac: Normal S1, S2; RRR; no murmurs, rubs, or gallops Lungs: Clear to auscultation bilaterally, no wheezing, rhonchi or rales  Abd: Soft, nontender, no hepatomegaly  Ext: No edema, pulses 2+ Musculoskeletal: No deformities, BUE and BLE strength normal and equal Skin: Warm and dry, no rashes   Neuro: Alert and oriented to person, place, time, and situation, CNII-XII grossly intact, no focal deficits  Psych: Normal mood and affect   ASSESSMENT:   Antonio Patterson is a 71 y.o. male who presents for the following: 1. Precordial pain   2. SOB (shortness of breath) on exertion   3. Pulmonary hypertension, unspecified (Antonio Patterson)   4. Mixed hyperlipidemia     PLAN:   1. Precordial pain 2. SOB (shortness of breath) on exertion -He reports shortness of breath for the past 1 year.  Occurs with heavy exertion.  Echocardiogram done at [facility] reports moderate pulm hypertension.  Unclear what to make of this.  All of the other findings were normal.  I cannot see the images.  Likely will need to repeat his echocardiogram here at [facility].  In the interim we will rule out coronary disease.  His symptoms could represent an anginal equivalent.  He could not have chest  discomfort due to diabetes.  His EKG demonstrates sinus rhythm with nonspecific ST-T changes.  We will obtain a BMP, BNP, TSH today.  We will proceed with coronary CTA.  100 mg metoprolol tartrate 2 hours before the scan.  Based on the results of the scan he likely will need his echo repeated.  3. Pulmonary hypertension, unspecified (Antonio Patterson) -Echo with moderate pulm hypertension.  I cannot see the images.  BNP today.  No signs of heart failure.  Unclear why he would have pulmonary hypertension.  We will get a CT chest as above.  Likely needs to repeat his echocardiogram.  Hard for me to work this up further without confirming the diagnosis.  There are several inconsistencies in the echo report.  4. Mixed hyperlipidemia -Diabetic.  Most recent LDL at goal.  On a statin.  We will continue this.      Disposition: Return if symptoms worsen or fail to improve.  Medication Adjustments/Labs and Tests Ordered: Current medicines are reviewed at length with the patient today.  Concerns regarding medicines are outlined above.  Orders Placed This Encounter  Procedures   CT CORONARY MORPH W/CTA COR W/SCORE W/CA W/CM &/OR WO/CM   Basic metabolic panel   Brain natriuretic peptide   TSH   EKG 12-Lead   Meds ordered this encounter  Medications   metoprolol tartrate (LOPRESSOR) 100 MG tablet    Sig: Take 1 tablet by mouth once for procedure.    Dispense:  1 tablet    Refill:  0    Patient Instructions  Medication Instructions:  TAKE Metoprolol 100 mg two hours before CT when scheduled.   *If you need a refill on your cardiac medications before your next appointment, please call your pharmacy*   Lab Work: BMET, BNP, TSH today   If you have labs (blood work) drawn today and your tests are completely normal, you will receive your results only by: [employer] (  if you have MyChart) OR A paper copy in the mail If you have any lab test that is abnormal or we need to change your treatment, we will  call you to review the results.   Testing/Procedures: Your physician has requested that you have cardiac CT. Cardiac computed tomography (CT) is a painless test that uses an x-ray machine to take clear, detailed pictures of your heart. For further information please visit [email or web address]. Please follow instruction sheet as given.    Follow-Up: At [facility], you and your health needs are our priority.  As part of our continuing mission to provide you with exceptional heart care, we have created designated Provider Care Teams.  These Care Teams include your primary Cardiologist (physician) and Advanced Practice Providers (APPs -  Physician Assistants and Nurse Practitioners) who all work together to provide you with the care you need, when you need it.  We recommend signing up for the patient portal called "MyChart".  Sign up information is provided on this After Visit Summary.  MyChart is used to connect with patients for Virtual Visits (Telemedicine).  Patients are able to view lab/test results, encounter notes, upcoming appointments, etc.  Non-urgent messages can be sent to your provider as well.   To learn more about what you can do with MyChart, go to [email or web address].    Your next appointment:   As needed  The format for your next appointment:   In Person  Provider:   Eleonore Chiquito, Antonio Patterson       Your cardiac CT will be scheduled at one of the below locations:   [facility] [address] [place], [place] [address] [phone]   If scheduled at [facility], please arrive at the [facility] and Children's Entrance (Entrance C2) of [facility] 30 minutes prior to test start time. You can use the FREE valet parking offered at entrance C (encouraged to control the heart rate for the test)  Proceed to the [facility] Radiology Department (first floor) to check-in and test prep.  All radiology patients and guests should use entrance C2 at  [facility], accessed from [facility], even though the hospital's physical address listed is [address]     Please follow these instructions carefully (unless otherwise directed):  Hold all erectile dysfunction medications at least 3 days (72 hrs) prior to test.  On the Night Before the Test: Be sure to Drink plenty of water. Do not consume any caffeinated/decaffeinated beverages or chocolate 12 hours prior to your test. Do not take any antihistamines 12 hours prior to your test.   On the Day of the Test: Drink plenty of water until 1 hour prior to the test. Do not eat any food 4 hours prior to the test. You may take your regular medications prior to the test.  Take metoprolol (Lopressor) two hours prior to test. HOLD Furosemide/Hydrochlorothiazide morning of the test.      After the Test: Drink plenty of water. After receiving IV contrast, you may experience a mild flushed feeling. This is normal. On occasion, you may experience a mild rash up to 24 hours after the test. This is not dangerous. If this occurs, you can take Benadryl 25 mg and increase your fluid intake. If you experience trouble breathing, this can be serious. If it is severe call 911 IMMEDIATELY. If it is mild, please call our office. If you take any of these medications: Glipizide/Metformin, Avandament, Glucavance, please do not  take 48 hours after completing test unless otherwise instructed.  We will call to schedule your test 2-4 weeks out understanding that some insurance companies will need an authorization prior to the service being performed.   For non-scheduling related questions, please contact the cardiac imaging nurse navigator should you have any questions/concerns: Marchia Bond, Cardiac Imaging Nurse Navigator Gordy Clement, Cardiac Imaging Nurse Navigator [facility] Direct Office Dial: [phone]   For scheduling needs, including  cancellations and rescheduling, please call [place], [phone].         Signed, Antonio Naegeli. Audie Box, Antonio Patterson, [place]  [address] [place], [place] [address] [phone]  05/26/2022 10:06 AM

## 2022-05-26 ENCOUNTER — Encounter: Payer: Self-pay | Admitting: Cardiovascular Disease

## 2022-05-26 ENCOUNTER — Ambulatory Visit (INDEPENDENT_AMBULATORY_CARE_PROVIDER_SITE_OTHER): Payer: Medicare Other | Admitting: Cardiovascular Disease

## 2022-05-26 VITALS — BP 132/70 | HR 83 | Ht 67.0 in | Wt 210.4 lb

## 2022-05-26 DIAGNOSIS — E782 Mixed hyperlipidemia: Secondary | ICD-10-CM

## 2022-05-26 DIAGNOSIS — R072 Precordial pain: Secondary | ICD-10-CM

## 2022-05-26 DIAGNOSIS — R0602 Shortness of breath: Secondary | ICD-10-CM | POA: Diagnosis not present

## 2022-05-26 DIAGNOSIS — I272 Pulmonary hypertension, unspecified: Secondary | ICD-10-CM

## 2022-05-26 MED ORDER — METOPROLOL TARTRATE 100 MG PO TABS: ORAL_TABLET | ORAL | 0 refills | Status: DC

## 2022-05-26 NOTE — Patient Instructions (Addendum)
Medication Instructions:  TAKE Metoprolol 100 mg two hours before CT when scheduled.   *If you need a refill on your cardiac medications before your next appointment, please call your pharmacy*   Lab Work: BMET, BNP, TSH today   If you have labs (blood work) drawn today and your tests are completely normal, you will receive your results only by: MyChart Message (if you have MyChart) OR A paper copy in the mail If you have any lab test that is abnormal or we need to change your treatment, we will call you to review the results.   Testing/Procedures: Your physician has requested that you have cardiac CT. Cardiac computed tomography (CT) is a painless test that uses an x-ray machine to take clear, detailed pictures of your heart. For further information please visit [email or web address] Please follow instruction sheet as given.    Follow-Up: At [facility], you and your health needs are our priority.  As part of our continuing mission to provide you with exceptional heart care, we have created designated Provider Care Teams.  These Care Teams include your primary Cardiologist (physician) and Advanced Practice Providers (APPs -  Physician Assistants and Nurse Practitioners) who all work together to provide you with the care you need, when you need it.  We recommend signing up for the patient portal called "MyChart".  Sign up information is provided on this After Visit Summary.  MyChart is used to connect with patients for Virtual Visits (Telemedicine).  Patients are able to view lab/test results, encounter notes, upcoming appointments, etc.  Non-urgent messages can be sent to your provider as well.   To learn more about what you can do with MyChart, go to [email or web address].    Your next appointment:   As needed  The format for your next appointment:   In Person  Provider:   Lennie Odor, MD       Your cardiac CT will be scheduled at one of the below locations:   [facility] [address] [place], [place] [address] [phone]   If scheduled at [facility], please arrive at the [facility] and Children's Entrance (Entrance C2) of [facility] 30 minutes prior to test start time. You can use the FREE valet parking offered at entrance C (encouraged to control the heart rate for the test)  [facility]first floor) to check-in and test prep.  All radiology patients and guests should use entrance C2 at [facility], accessed from [facility], even though the hospital's physical address listed is [address]     Please follow these instructions carefully (unless otherwise directed):  Hold all erectile dysfunction medications at least 3 days (72 hrs) prior to test.  On the Night Before the Test: Be sure to Drink plenty of water. Do not consume any caffeinated/decaffeinated beverages or chocolate 12 hours prior to your test. Do not take any antihistamines 12 hours prior to your test.   On the Day of the Test: Drink plenty of water until 1 hour prior to the test. Do not eat any food 4 hours prior to the test. You may take your regular medications prior to the test.  Take metoprolol (Lopressor) two hours prior to test. HOLD Furosemide/Hydrochlorothiazide morning of the test.      After the Test: Drink plenty of water. After receiving IV contrast, you may experience a mild flushed feeling. This is normal. On occasion, you may experience a  mild rash up to 24 hours after the test. This is not dangerous. If this occurs, you can take Benadryl 25 mg and increase your fluid intake. If you experience trouble breathing, this can be serious. If it is severe call 911 IMMEDIATELY. If it is mild, please call our office. If you take any of these medications: Glipizide/Metformin, Avandament, Glucavance, please do not take 48 hours after completing test unless otherwise  instructed.  We will call to schedule your test 2-4 weeks out understanding that some insurance companies will need an authorization prior to the service being performed.   For non-scheduling related questions, please contact the cardiac imaging nurse navigator should you have any questions/concerns: Rockwell Alexandria, Cardiac Imaging Nurse Navigator Larey Brick, Cardiac Imaging Nurse Navigator [facility] [facility] Direct Office Dial: [phone]   For scheduling needs, including cancellations and rescheduling, please call [place], [phone].

## 2022-05-27 LAB — BASIC METABOLIC PANEL
BUN/Creatinine Ratio: 15 (ref 10–24)
BUN: 15 mg/dL (ref 8–27)
CO2: 25 mmol/L (ref 20–29)
Calcium: 9.9 mg/dL (ref 8.6–10.2)
Chloride: 99 mmol/L (ref 96–106)
Creatinine, Ser: 0.99 mg/dL (ref 0.76–1.27)
Glucose: 139 mg/dL — ABNORMAL HIGH (ref 70–99)
Potassium: 4.3 mmol/L (ref 3.5–5.2)
Sodium: 138 mmol/L (ref 134–144)
eGFR: 81 mL/min/{1.73_m2} (ref >59)

## 2022-05-27 LAB — BRAIN NATRIURETIC PEPTIDE: BNP: 15.6 pg/mL (ref 0.0–100.0)

## 2022-05-27 LAB — TSH: TSH: 2.41 u[IU]/mL (ref 0.450–4.500)

## 2022-06-22 ENCOUNTER — Telehealth (HOSPITAL_COMMUNITY): Payer: Self-pay | Admitting: Emergency Medicine

## 2022-06-22 NOTE — Telephone Encounter (Signed)
Reaching out to patient to offer assistance regarding upcoming cardiac imaging study; pt verbalizes understanding of appt date/time, parking situation and where to check in, pre-test NPO status and medications ordered, and verified current allergies; name and call back number provided for further questions should they arise Rockwell Alexandria RN Navigator Cardiac Imaging [facility] [phone] office [phone] cell  100mg  metoprolol  Denies iv issues Arrival 900 Holding HCTZ, allergy meds

## 2022-06-23 ENCOUNTER — Ambulatory Visit (HOSPITAL_BASED_OUTPATIENT_CLINIC_OR_DEPARTMENT_OTHER)
Admission: RE | Admit: 2022-06-23 | Discharge: 2022-06-23 | Disposition: A | Discharge status: Home / Self Care | Payer: Medicare Other | Source: Ambulatory Visit | Attending: Cardiology | Admitting: Cardiology

## 2022-06-23 ENCOUNTER — Other Ambulatory Visit: Payer: Self-pay | Admitting: Cardiology

## 2022-06-23 ENCOUNTER — Ambulatory Visit (HOSPITAL_COMMUNITY)
Admission: RE | Admit: 2022-06-23 | Discharge: 2022-06-23 | Disposition: A | Discharge status: Home / Self Care | Payer: Medicare Other | Source: Ambulatory Visit | Attending: Cardiovascular Disease | Admitting: Cardiovascular Disease

## 2022-06-23 ENCOUNTER — Ambulatory Visit (HOSPITAL_COMMUNITY)
Admission: RE | Admit: 2022-06-23 | Discharge: 2022-06-23 | Disposition: A | Discharge status: Home / Self Care | Payer: Medicare Other | Source: Ambulatory Visit | Attending: Cardiology | Admitting: Cardiology

## 2022-06-23 DIAGNOSIS — I2510 Atherosclerotic heart disease of native coronary artery without angina pectoris: Secondary | ICD-10-CM | POA: Diagnosis not present

## 2022-06-23 DIAGNOSIS — R072 Precordial pain: Secondary | ICD-10-CM | POA: Insufficient documentation

## 2022-06-23 DIAGNOSIS — R931 Abnormal findings on diagnostic imaging of heart and coronary circulation: Secondary | ICD-10-CM

## 2022-06-23 DIAGNOSIS — R0602 Shortness of breath: Secondary | ICD-10-CM | POA: Diagnosis present

## 2022-06-23 MED ORDER — NITROGLYCERIN 0.4 MG SL SUBL
0.8000 mg | SUBLINGUAL_TABLET | Freq: Once | SUBLINGUAL | Status: AC
  Administered 2022-06-23: 0.8 mg via SUBLINGUAL

## 2022-06-23 MED ORDER — NITROGLYCERIN 0.4 MG SL SUBL
SUBLINGUAL_TABLET | SUBLINGUAL | Status: AC
  Filled 2022-06-23: qty 2

## 2022-06-23 MED ORDER — IOHEXOL 350 MG/ML SOLN
100.0000 mL | Freq: Once | INTRAVENOUS | Status: AC | PRN
  Administered 2022-06-23: 100 mL via INTRAVENOUS

## 2022-06-25 DIAGNOSIS — R931 Abnormal findings on diagnostic imaging of heart and coronary circulation: Secondary | ICD-10-CM | POA: Diagnosis not present

## 2022-07-06 NOTE — Progress Notes (Signed)
Cardiology Office Note:   Date:  07/07/2022  NAME:  Antonio Patterson    MRN: [id number] DOB:  09-May-1951   PCP:  Imagene Riches, NP  Cardiologist:  None  Electrophysiologist:  None   Referring MD: Imagene Riches, NP   Chief Complaint  Patient presents with   Follow-up         History of Present Illness:   KASETON SCITES is a 71 y.o. male with a hx of CAD, DM, HLD who presents for follow-up. CCTA with CAD.  He presents for follow-up.  Reports he still getting short of breath with exertion.  EKG shows normal sinus rhythm.  Calcium score severely elevated.  Diffuse moderate disease.  We discussed left and right heart catheterization to sort out his coronary anatomy as well as to measure his pulmonary pressures.  He is okay to do this.  Cholesterol level could be better.  Intolerant of Crestor.  We will try Lipitor.  Currently on aspirin.  Blood pressure is well controlled.  No symptoms of chest pain but has been diabetic since he was 22.  Given his diabetes and close to 30 years suspect he may have anginal equivalent.  He is willing to proceed with heart catheterization.  Problem List CAD -calcium score 6107 -moderate RCA 50-69% -mod LCX 50-69% 2. DM -A1c 6.8 3. 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 **PATIENT NEEDS APT FOR FURTHER REFILLS**   aspirin 81 MG tablet Take 81 mg by mouth daily.   atorvastatin (LIPITOR) 40 MG tablet Take 1 tablet (40 mg total) by mouth daily.   glucose blood test strip 1 each by Other route in the morning, at noon, in the evening, and at bedtime.    hydrochlorothiazide (MICROZIDE) 12.5 MG capsule Take 1 capsule by mouth once daily (Patient taking differently: Take 12.5 mg by mouth daily.)   insulin aspart (NOVOLOG FLEXPEN RELION) 100 UNIT/ML FlexPen Inject 5-40 Units into the skin See admin instructions. 3-5 times per day, and pen needles 1/day   loratadine-pseudoephedrine (CLARITIN-D 24 HOUR) 10-240 MG 24 hr tablet Take 1 tablet by mouth daily.   losartan-hydrochlorothiazide (HYZAAR) 100-12.5 MG tablet Take 1 tablet by mouth daily.   metoprolol tartrate (LOPRESSOR) 100 MG tablet Take 1 tablet by mouth once for procedure.   omeprazole (PRILOSEC) 20 MG capsule Take 2 capsules (40 mg total) by mouth daily.   tamsulosin (FLOMAX) 0.4 MG CAPS capsule Take 1 capsule (0.4 mg total) by mouth daily.   traMADol (ULTRAM) 50 MG tablet TAKE 1 TABLET BY MOUTH EVERY 8 HOURS AS NEEDED (Patient taking differently: Take 50 mg by mouth every 8 (eight) hours as needed for moderate pain.)   Vitamin D, Ergocalciferol, (DRISDOL) 1.25 MG (50000 UT) CAPS capsule Take one tablet wkly (  Patient taking differently: Take 50,000 Units by mouth every 7 (seven) days.)   [DISCONTINUED] simvastatin (ZOCOR) 40 MG tablet TAKE 1 TABLET BY MOUTH ONCE DAILY IN THE EVENING (Patient taking differently: Take 40 mg by mouth daily.)     Allergies:    Metformin, Rosuvastatin, Sulfa antibiotics, and Sulfonamide derivatives   Social History: Social History   Socioeconomic History   Marital status: Married    Spouse name: Not on file   Number of children: 1   Years of education: Not on file   Highest education level: Not on file  Occupational History    Employer: [employer]     Comment: Works for [employer]   Occupation: Engineering geologist  Tobacco Use   Smoking status: Former    Packs/day: 1.00    Years: 10.00    Total pack years: 10.00    Types: Cigarettes    Quit date: 1971    Years since quitting: 52.5   Smokeless tobacco: Never  Vaping Use   Vaping Use: Never used  Substance and Sexual Activity   Alcohol use: Yes    Comment: 6/week   Drug use: Never   Sexual activity: Not Currently  Other Topics Concern   Not on file  Social History Narrative   Not on file   Social Determinants of Health   Financial Resource Strain: Not on file  Food Insecurity: Not on file  Transportation Needs: Not on file  Physical Activity: Not on file  Stress: Not on file  Social Connections: Not on file     Family History: The patient's family history includes Cancer in his maternal grandmother and paternal grandfather; Heart disease in his father; Stroke in his maternal grandfather.  ROS:   All other ROS reviewed and negative. Pertinent positives noted in the HPI.     EKGs/Labs/Other Studies Reviewed:   The following studies were personally reviewed by me today:  EKG:  EKG is ordered today.  The ekg ordered today demonstrates NSR 67, no acute changes, and was personally reviewed by me.   CCTA 06/23/2022 IMPRESSION: 1. Coronary calcium score of 6107. This was 99th percentile for age and sex matched control.   2.  Normal coronary origin with right dominance.   3. Severe diffuse coronary calcifications. Can overestimate degree of coronary stenosis due to blooming artifact from severe calcifications.   4. Calcified plaque in the proximal RCA causes moderate (50-69%) stenosis. Calcified plaque in the mid RCA causes mild (25-49%) stenosis. Calcified plaque in the distal RCA causes moderate (50-69%) stenosis.   5. Calcified plaque in the proximal LAD causes mild (25-49%) stenosis. Calcified plaque in the mid LAD causes mild  (25-49%) stenosis. Calcified plaque in the distal LAD causes moderate (50-69%) stenosis. Calcified plaque in D1 causes mild (25-49%) stenosis.   6. Calcified plaque in the proximal LCX causes mild (25-49%) stenosis. Calcified plaque in the mid LCX causes mild (25-49%) stenosis. Calcified plaque in the distal LCX causes moderate (50-69%) stenosis. Calcified plaque in proximal OM1 causes moderate (50-69%) stenosis. Calcified plaque in distal OM1 causes moderate (50-69%) stenosis.   7. Calcified plaque in the left main causes minimal (0-24%) stenosis.   8.  Mild aortic valve calcifications (AV calcium score 161)   9.  Will send study for FFR  Recent Labs: 05/26/2022: BNP 15.6; BUN 15; Creatinine, Ser 0.99; Potassium 4.3; Sodium 138; TSH 2.410   Recent Lipid Panel    Component Value Date/Time   CHOL 140 02/28/2019  0914   TRIG 51 02/28/2019 0914   HDL 51 02/28/2019 0914   CHOLHDL 2.7 02/28/2019 0914   CHOLHDL 3 02/22/2018 1134   VLDL 7.6 02/22/2018 1134   LDLCALC 79 02/28/2019 0914    Physical Exam:   VS:  BP 122/60 (BP Location: Left Arm)   Pulse 67   Ht 5\' 7"  (1.702 m)   Wt 209 lb 3.2 oz (94.9 kg)   SpO2 95%   BMI 32.77 kg/m    Wt Readings from Last 3 Encounters:  07/07/22 209 lb 3.2 oz (94.9 kg)  05/26/22 210 lb 6.4 oz (95.4 kg)  01/26/21 213 lb 9.6 oz (96.9 kg)    General: Well nourished, well developed, in no acute distress Head: Atraumatic, normal size  Eyes: PEERLA, EOMI  Neck: Supple, no JVD Endocrine: No thryomegaly Cardiac: Normal S1, S2; RRR; no murmurs, rubs, or gallops Lungs: Clear to auscultation bilaterally, no wheezing, rhonchi or rales  Abd: Soft, nontender, no hepatomegaly  Ext: No edema, pulses 2+ Musculoskeletal: No deformities, BUE and BLE strength normal and equal Skin: Warm and dry, no rashes   Neuro: Alert and oriented to person, place, time, and situation, CNII-XII grossly intact, no focal deficits  Psych: Normal mood and affect    ASSESSMENT:   Antonio Patterson is a 71 y.o. male who presents for the following: 1. SOB (shortness of breath) on exertion   2. Coronary artery disease of native artery of native heart with stable angina pectoris (HCC)   3. Agatston coronary artery calcium score greater than 400   4. Mixed hyperlipidemia   5. Pulmonary hypertension, unspecified (HCC)     PLAN:   1. SOB (shortness of breath) on exertion 2. Coronary artery disease of native artery of native heart with stable angina pectoris (HCC) 3. Agatston coronary artery calcium score greater than 400 4. Mixed hyperlipidemia -Calcium score over 6000.  Concern for diffuse moderate CAD.  I am not sure that FFR CT is reliable in this situation.  He is having ongoing symptoms of shortness of breath which I suspect are an anginal equivalent given his longstanding history of diabetes.  We will proceed with left and right heart catheterization.  There was mention of pulm hypertension on his outside echocardiogram.  We will evaluate that with right heart catheterization.  His EKG is normal.  He will continue aspirin.  We will transition his simvastatin to Lipitor 40 mg daily.  His blood pressure is well controlled.  He denies any chest pain but we will give him a prescription for nitroglycerin.  Suspect he may not need this.  I will see him back 2 weeks after his heart catheterization.  Further evaluation at that point.  5. Pulmonary hypertension, unspecified (HCC) -Right heart catheterization   Shared Decision Making/Informed Consent The risks [stroke (1 in 1000), death (1 in 1000), kidney failure [usually temporary] (1 in 500), bleeding (1 in 200), allergic reaction [possibly serious] (1 in 200)], benefits (diagnostic support and management of coronary artery disease) and alternatives of a cardiac catheterization were discussed in detail with Mr. Dennington and he is willing to proceed.  Disposition: Return in about 3 weeks (around  07/28/2022).  Medication Adjustments/Labs and Tests Ordered: Current medicines are reviewed at length with the patient today.  Concerns regarding medicines are outlined above.  Orders Placed This Encounter  Procedures   CBC   Basic metabolic panel   EKG 12-Lead   Meds ordered this encounter  Medications   atorvastatin (LIPITOR)  40 MG tablet    Sig: Take 1 tablet (40 mg total) by mouth daily.    Dispense:  90 tablet    Refill:  3    Patient Instructions  Medication Instructions:   STOP Simvastatin START Lipitor 40 mg daily   *If you need a refill on your cardiac medications before your next appointment, please call your pharmacy*   Lab Work:  BMET, CBC today   If you have labs (blood work) drawn today and your tests are completely normal, you will receive your results only by: [place] (if you have MyChart) OR A paper copy in the mail If you have any lab test that is abnormal or we need to change your treatment, we will call you to review the results.   Testing/Procedures:  Your physician has requested that you have a cardiac catheterization. Cardiac catheterization is used to diagnose and/or treat various heart conditions. Doctors may recommend this procedure for a number of different reasons. The most common reason is to evaluate chest pain. Chest pain can be a symptom of coronary artery disease (CAD), and cardiac catheterization can show whether plaque is narrowing or blocking your heart's arteries. This procedure is also used to evaluate the valves, as well as measure the blood flow and oxygen levels in different parts of your heart. For further information please visit [email or web address]. Please follow instruction sheet, as given.    Follow-Up: At [facility], you and your health needs are our priority.  As part of our continuing mission to provide you with exceptional heart care, we have created designated Provider Care Teams.  These Care Teams include your  primary Cardiologist (physician) and Advanced Practice Providers (APPs -  Physician Assistants and Nurse Practitioners) who all work together to provide you with the care you need, when you need it.  We recommend signing up for the patient portal called "MyChart".  Sign up information is provided on this After Visit Summary.  MyChart is used to connect with patients for Virtual Visits (Telemedicine).  Patients are able to view lab/test results, encounter notes, upcoming appointments, etc.  Non-urgent messages can be sent to your provider as well.   To learn more about what you can do with MyChart, go to [email or web address].    Your next appointment:   2 week(s) post CATH   The format for your next appointment:   In Person  Provider:   Eleonore Chiquito, MD   Other Instructions  [place] [place] [address] Dept: [phone] Loc: [phone]  KI AKERMAN  07/07/2022  You are scheduled for a Cardiac Catheterization on Friday, July 21 with Dr. Harrell Gave End.  1. Please arrive at the Main Entrance A at [facility]: [place],  [address] at 8:30 AM (This time is two hours before your procedure to ensure your preparation). Free valet parking service is available.   Special note: Every effort is made to have your procedure done on time. Please understand that emergencies sometimes delay scheduled procedures.  2. Diet: Do not eat solid foods after midnight.  You may have clear liquids until 5 AM upon the day of the procedure.  3. Labs: You will need to have blood drawn (BMET, CBC today) You do not need to be fasting.  4. Medication instructions in preparation for your procedure:   Contrast Allergy: No  DO NOT TAKE EVENING DOSE OF INSULIN (Novolog)  On the morning of your procedure, take Aspirin and any morning medicines NOT listed above.  You  may use sips of water.  5. Plan to go home the same day, you will only stay overnight if medically necessary. 6. You MUST have a responsible adult to drive you home. 7. An adult MUST be with you the first 24 hours after you arrive home. 8. Bring a current list of your medications, and the last time and date medication taken. 9. Bring ID and current insurance cards. 10.Please wear clothes that are easy to get on and off and wear slip-on shoes.  Thank you for allowing [place] to care for you!   -- [facility]           Time Spent with Patient: I have spent a total of 35 minutes with patient reviewing hospital notes, telemetry, EKGs, labs and examining the patient as well as establishing an assessment and plan that was discussed with the patient.  > 50% of time was spent in direct patient care.  Signed, Lenna Gilford. Flora Lipps, MD, [facility]  [facility]  [address], [address] [place], [place] [address] [phone]  07/07/2022 2:45 PM

## 2022-07-06 NOTE — H&P (View-Only) (Signed)
Cardiology Office Note:   Date:  07/07/2022  NAME:  Antonio Patterson    MRN: [id number] DOB:  09-May-1951   PCP:  Imagene Riches, NP  Cardiologist:  None  Electrophysiologist:  None   Referring MD: Imagene Riches, NP   Chief Complaint  Patient presents with   Follow-up         History of Present Illness:   Antonio Patterson is a 71 y.o. male with a hx of CAD, DM, HLD who presents for follow-up. CCTA with CAD.  He presents for follow-up.  Reports he still getting short of breath with exertion.  EKG shows normal sinus rhythm.  Calcium score severely elevated.  Diffuse moderate disease.  We discussed left and right heart catheterization to sort out his coronary anatomy as well as to measure his pulmonary pressures.  He is okay to do this.  Cholesterol level could be better.  Intolerant of Crestor.  We will try Lipitor.  Currently on aspirin.  Blood pressure is well controlled.  No symptoms of chest pain but has been diabetic since he was 22.  Given his diabetes and close to 30 years suspect he may have anginal equivalent.  He is willing to proceed with heart catheterization.  Problem List CAD -calcium score 6107 -moderate RCA 50-69% -mod LCX 50-69% 2. DM -A1c 6.8 3. 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 **PATIENT NEEDS APT FOR FURTHER REFILLS**   aspirin 81 MG tablet Take 81 mg by mouth daily.   atorvastatin (LIPITOR) 40 MG tablet Take 1 tablet (40 mg total) by mouth daily.   glucose blood test strip 1 each by Other route in the morning, at noon, in the evening, and at bedtime.    hydrochlorothiazide (MICROZIDE) 12.5 MG capsule Take 1 capsule by mouth once daily (Patient taking differently: Take 12.5 mg by mouth daily.)   insulin aspart (NOVOLOG FLEXPEN RELION) 100 UNIT/ML FlexPen Inject 5-40 Units into the skin See admin instructions. 3-5 times per day, and pen needles 1/day   loratadine-pseudoephedrine (CLARITIN-D 24 HOUR) 10-240 MG 24 hr tablet Take 1 tablet by mouth daily.   losartan-hydrochlorothiazide (HYZAAR) 100-12.5 MG tablet Take 1 tablet by mouth daily.   metoprolol tartrate (LOPRESSOR) 100 MG tablet Take 1 tablet by mouth once for procedure.   omeprazole (PRILOSEC) 20 MG capsule Take 2 capsules (40 mg total) by mouth daily.   tamsulosin (FLOMAX) 0.4 MG CAPS capsule Take 1 capsule (0.4 mg total) by mouth daily.   traMADol (ULTRAM) 50 MG tablet TAKE 1 TABLET BY MOUTH EVERY 8 HOURS AS NEEDED (Patient taking differently: Take 50 mg by mouth every 8 (eight) hours as needed for moderate pain.)   Vitamin D, Ergocalciferol, (DRISDOL) 1.25 MG (50000 UT) CAPS capsule Take one tablet wkly (  Patient taking differently: Take 50,000 Units by mouth every 7 (seven) days.)   [DISCONTINUED] simvastatin (ZOCOR) 40 MG tablet TAKE 1 TABLET BY MOUTH ONCE DAILY IN THE EVENING (Patient taking differently: Take 40 mg by mouth daily.)     Allergies:    Metformin, Rosuvastatin, Sulfa antibiotics, and Sulfonamide derivatives   Social History: Social History   Socioeconomic History   Marital status: Married    Spouse name: Not on file   Number of children: 1   Years of education: Not on file   Highest education level: Not on file  Occupational History    Employer: [employer]     Comment: Works for [employer]   Occupation: Computer detailer [employer]  Tobacco Use   Smoking status: Former    Packs/day: 1.00    Years: 10.00    Total pack years: 10.00    Types: Cigarettes    Quit date: 1971    Years since quitting: 52.5   Smokeless tobacco: Never  Vaping Use   Vaping Use: Never used  Substance and Sexual Activity   Alcohol use: Yes    Comment: 6/week   Drug use: Never   Sexual activity: Not Currently  Other Topics Concern   Not on file  Social History Narrative   Not on file   Social Determinants of Health   Financial Resource Strain: Not on file  Food Insecurity: Not on file  Transportation Needs: Not on file  Physical Activity: Not on file  Stress: Not on file  Social Connections: Not on file     Family History: The patient's family history includes Cancer in his maternal grandmother and paternal grandfather; Heart disease in his father; Stroke in his maternal grandfather.  ROS:   All other ROS reviewed and negative. Pertinent positives noted in the HPI.     EKGs/Labs/Other Studies Reviewed:   The following studies were personally reviewed by me today:  EKG:  EKG is ordered today.  The ekg ordered today demonstrates NSR 67, no acute changes, and was personally reviewed by me.   CCTA 06/23/2022 IMPRESSION: 1. Coronary calcium score of 6107. This was 99th percentile for age and sex matched control.   2.  Normal coronary origin with right dominance.   3. Severe diffuse coronary calcifications. Can overestimate degree of coronary stenosis due to blooming artifact from severe calcifications.   4. Calcified plaque in the proximal RCA causes moderate (50-69%) stenosis. Calcified plaque in the mid RCA causes mild (25-49%) stenosis. Calcified plaque in the distal RCA causes moderate (50-69%) stenosis.   5. Calcified plaque in the proximal LAD causes mild (25-49%) stenosis. Calcified plaque in the mid LAD causes mild  (25-49%) stenosis. Calcified plaque in the distal LAD causes moderate (50-69%) stenosis. Calcified plaque in D1 causes mild (25-49%) stenosis.   6. Calcified plaque in the proximal LCX causes mild (25-49%) stenosis. Calcified plaque in the mid LCX causes mild (25-49%) stenosis. Calcified plaque in the distal LCX causes moderate (50-69%) stenosis. Calcified plaque in proximal OM1 causes moderate (50-69%) stenosis. Calcified plaque in distal OM1 causes moderate (50-69%) stenosis.   7. Calcified plaque in the left main causes minimal (0-24%) stenosis.   8.  Mild aortic valve calcifications (AV calcium score 161)   9.  Will send study for FFR  Recent Labs: 05/26/2022: BNP 15.6; BUN 15; Creatinine, Ser 0.99; Potassium 4.3; Sodium 138; TSH 2.410   Recent Lipid Panel    Component Value Date/Time   CHOL 140 02/28/2019   0914   TRIG 51 02/28/2019 0914   HDL 51 02/28/2019 0914   CHOLHDL 2.7 02/28/2019 0914   CHOLHDL 3 02/22/2018 1134   VLDL 7.6 02/22/2018 1134   LDLCALC 79 02/28/2019 0914    Physical Exam:   VS:  BP 122/60 (BP Location: Left Arm)   Pulse 67   Ht 5\' 7"  (1.702 m)   Wt 209 lb 3.2 oz (94.9 kg)   SpO2 95%   BMI 32.77 kg/m    Wt Readings from Last 3 Encounters:  07/07/22 209 lb 3.2 oz (94.9 kg)  05/26/22 210 lb 6.4 oz (95.4 kg)  01/26/21 213 lb 9.6 oz (96.9 kg)    General: Well nourished, well developed, in no acute distress Head: Atraumatic, normal size  Eyes: PEERLA, EOMI  Neck: Supple, no JVD Endocrine: No thryomegaly Cardiac: Normal S1, S2; RRR; no murmurs, rubs, or gallops Lungs: Clear to auscultation bilaterally, no wheezing, rhonchi or rales  Abd: Soft, nontender, no hepatomegaly  Ext: No edema, pulses 2+ Musculoskeletal: No deformities, BUE and BLE strength normal and equal Skin: Warm and dry, no rashes   Neuro: Alert and oriented to person, place, time, and situation, CNII-XII grossly intact, no focal deficits  Psych: Normal mood and affect    ASSESSMENT:   Antonio Patterson is a 71 y.o. male who presents for the following: 1. SOB (shortness of breath) on exertion   2. Coronary artery disease of native artery of native heart with stable angina pectoris (HCC)   3. Agatston coronary artery calcium score greater than 400   4. Mixed hyperlipidemia   5. Pulmonary hypertension, unspecified (HCC)     PLAN:   1. SOB (shortness of breath) on exertion 2. Coronary artery disease of native artery of native heart with stable angina pectoris (HCC) 3. Agatston coronary artery calcium score greater than 400 4. Mixed hyperlipidemia -Calcium score over 6000.  Concern for diffuse moderate CAD.  I am not sure that FFR CT is reliable in this situation.  He is having ongoing symptoms of shortness of breath which I suspect are an anginal equivalent given his longstanding history of diabetes.  We will proceed with left and right heart catheterization.  There was mention of pulm hypertension on his outside echocardiogram.  We will evaluate that with right heart catheterization.  His EKG is normal.  He will continue aspirin.  We will transition his simvastatin to Lipitor 40 mg daily.  His blood pressure is well controlled.  He denies any chest pain but we will give him a prescription for nitroglycerin.  Suspect he may not need this.  I will see him back 2 weeks after his heart catheterization.  Further evaluation at that point.  5. Pulmonary hypertension, unspecified (HCC) -Right heart catheterization   Shared Decision Making/Informed Consent The risks [stroke (1 in 1000), death (1 in 1000), kidney failure [usually temporary] (1 in 500), bleeding (1 in 200), allergic reaction [possibly serious] (1 in 200)], benefits (diagnostic support and management of coronary artery disease) and alternatives of a cardiac catheterization were discussed in detail with Mr. Dennington and he is willing to proceed.  Disposition: Return in about 3 weeks (around  07/28/2022).  Medication Adjustments/Labs and Tests Ordered: Current medicines are reviewed at length with the patient today.  Concerns regarding medicines are outlined above.  Orders Placed This Encounter  Procedures   CBC   Basic metabolic panel   EKG 12-Lead   Meds ordered this encounter  Medications   atorvastatin (LIPITOR)  40 MG tablet    Sig: Take 1 tablet (40 mg total) by mouth daily.    Dispense:  90 tablet    Refill:  3    Patient Instructions  Medication Instructions:   STOP Simvastatin START Lipitor 40 mg daily   *If you need a refill on your cardiac medications before your next appointment, please call your pharmacy*   Lab Work:  BMET, CBC today   If you have labs (blood work) drawn today and your tests are completely normal, you will receive your results only by: [place] (if you have MyChart) OR A paper copy in the mail If you have any lab test that is abnormal or we need to change your treatment, we will call you to review the results.   Testing/Procedures:  Your physician has requested that you have a cardiac catheterization. Cardiac catheterization is used to diagnose and/or treat various heart conditions. Doctors may recommend this procedure for a number of different reasons. The most common reason is to evaluate chest pain. Chest pain can be a symptom of coronary artery disease (CAD), and cardiac catheterization can show whether plaque is narrowing or blocking your heart's arteries. This procedure is also used to evaluate the valves, as well as measure the blood flow and oxygen levels in different parts of your heart. For further information please visit [email or web address]. Please follow instruction sheet, as given.    Follow-Up: At [facility], you and your health needs are our priority.  As part of our continuing mission to provide you with exceptional heart care, we have created designated Provider Care Teams.  These Care Teams include your  primary Cardiologist (physician) and Advanced Practice Providers (APPs -  Physician Assistants and Nurse Practitioners) who all work together to provide you with the care you need, when you need it.  We recommend signing up for the patient portal called "MyChart".  Sign up information is provided on this After Visit Summary.  MyChart is used to connect with patients for Virtual Visits (Telemedicine).  Patients are able to view lab/test results, encounter notes, upcoming appointments, etc.  Non-urgent messages can be sent to your provider as well.   To learn more about what you can do with MyChart, go to [email or web address].    Your next appointment:   2 week(s) post CATH   The format for your next appointment:   In Person  Provider:   Eleonore Chiquito, MD   Other Instructions  [place] [address] Dept: [phone] Loc: [phone]  KI AKERMAN  07/07/2022  You are scheduled for a Cardiac Catheterization on Friday, July 21 with Dr. Harrell Gave End.  1. Please arrive at the Main Entrance A at [facility]: [place],  [address] at 8:30 AM (This time is two hours before your procedure to ensure your preparation). Free valet parking service is available.   Special note: Every effort is made to have your procedure done on time. Please understand that emergencies sometimes delay scheduled procedures.  2. Diet: Do not eat solid foods after midnight.  You may have clear liquids until 5 AM upon the day of the procedure.  3. Labs: You will need to have blood drawn (BMET, CBC today) You do not need to be fasting.  4. Medication instructions in preparation for your procedure:   Contrast Allergy: No  DO NOT TAKE EVENING DOSE OF INSULIN (Novolog)  On the morning of your procedure, take Aspirin and any morning medicines NOT listed above.  You  may use sips of water.  5. Plan to go home the same day, you will only stay overnight if medically necessary. 6. You MUST have a responsible adult to drive you home. 7. An adult MUST be with you the first 24 hours after you arrive home. 8. Bring a current list of your medications, and the last time and date medication taken. 9. Bring ID and current insurance cards. 10.Please wear clothes that are easy to get on and off and wear slip-on shoes.  Thank you for allowing [place] to care for you!   -- [place] [facility]           Time Spent with Patient: I have spent a total of 35 minutes with patient reviewing hospital notes, telemetry, EKGs, labs and examining the patient as well as establishing an assessment and plan that was discussed with the patient.  > 50% of time was spent in direct patient care.  Signed, Lenna Gilford. Flora Lipps, MD, [facility]  [facility]  [address], [address] [place], [place] [address] [phone]  07/07/2022 2:45 PM

## 2022-07-07 ENCOUNTER — Encounter: Payer: Self-pay | Admitting: Cardiovascular Disease

## 2022-07-07 ENCOUNTER — Ambulatory Visit (INDEPENDENT_AMBULATORY_CARE_PROVIDER_SITE_OTHER): Payer: Medicare Other | Admitting: Cardiovascular Disease

## 2022-07-07 VITALS — BP 122/60 | HR 67 | Ht 67.0 in | Wt 209.2 lb

## 2022-07-07 DIAGNOSIS — R931 Abnormal findings on diagnostic imaging of heart and coronary circulation: Secondary | ICD-10-CM

## 2022-07-07 DIAGNOSIS — I272 Pulmonary hypertension, unspecified: Secondary | ICD-10-CM

## 2022-07-07 DIAGNOSIS — E782 Mixed hyperlipidemia: Secondary | ICD-10-CM

## 2022-07-07 DIAGNOSIS — I25118 Atherosclerotic heart disease of native coronary artery with other forms of angina pectoris: Secondary | ICD-10-CM | POA: Diagnosis not present

## 2022-07-07 DIAGNOSIS — R0602 Shortness of breath: Secondary | ICD-10-CM

## 2022-07-07 MED ORDER — ATORVASTATIN CALCIUM 40 MG PO TABS: 40.0000 mg | ORAL_TABLET | Freq: Every day | ORAL | 3 refills | Status: DC

## 2022-07-07 NOTE — Patient Instructions (Addendum)
Medication Instructions:   STOP Simvastatin START Lipitor 40 mg daily   *If you need a refill on your cardiac medications before your next appointment, please call your pharmacy*   Lab Work:  BMET, CBC today   If you have labs (blood work) drawn today and your tests are completely normal, you will receive your results only by: MyChart Message (if you have MyChart) OR A paper copy in the mail If you have any lab test that is abnormal or we need to change your treatment, we will call you to review the results.   Testing/Procedures:  Your physician has requested that you have a cardiac catheterization. Cardiac catheterization is used to diagnose and/or treat various heart conditions. Doctors may recommend this procedure for a number of different reasons. The most common reason is to evaluate chest pain. Chest pain can be a symptom of coronary artery disease (CAD), and cardiac catheterization can show whether plaque is narrowing or blocking your heart's arteries. This procedure is also used to evaluate the valves, as well as measure the blood flow and oxygen levels in different parts of your heart. For further information please visit [email or web address] Please follow instruction sheet, as given.    Follow-Up: At [facility], you and your health needs are our priority.  As part of our continuing mission to provide you with exceptional heart care, we have created designated Provider Care Teams.  These Care Teams include your primary Cardiologist (physician) and Advanced Practice Providers (APPs -  Physician Assistants and Nurse Practitioners) who all work together to provide you with the care you need, when you need it.  We recommend signing up for the patient portal called "MyChart".  Sign up information is provided on this After Visit Summary.  MyChart is used to connect with patients for Virtual Visits (Telemedicine).  Patients are able to view lab/test results, encounter notes, upcoming  appointments, etc.  Non-urgent messages can be sent to your provider as well.   To learn more about what you can do with MyChart, go to [email or web address].    Your next appointment:   2 week(s) post CATH   The format for your next appointment:   In Person  Provider:   Lennie Odor, MD   Other Instructions  [facility] CARDIOVASCULAR DIVISION [facility] [place] [address] [place] [address] Dept: [phone] Loc: [phone]  Antonio Patterson  07/07/2022  You are scheduled for a Cardiac Catheterization on Friday, July 21 with Dr. Cristal Deer End.  1. Please arrive at the Main Entrance A at [facility]: [address] [place], [place] [address] at 8:30 AM (This time is two hours before your procedure to ensure your preparation). Free valet parking service is available.   Special note: Every effort is made to have your procedure done on time. Please understand that emergencies sometimes delay scheduled procedures.  2. Diet: Do not eat solid foods after midnight.  You may have clear liquids until 5 AM upon the day of the procedure.  3. Labs: You will need to have blood drawn (BMET, CBC today) You do not need to be fasting.  4. Medication instructions in preparation for your procedure:   Contrast Allergy: No  DO NOT TAKE EVENING DOSE OF INSULIN (Novolog)  On the morning of your procedure, take Aspirin and any morning medicines NOT listed above.  You may use sips of water.  5. Plan to go home the same day, you will only stay  overnight if medically necessary. 6. You MUST have a responsible adult to drive you home. 7. An adult MUST be with you the first 24 hours after you arrive home. 8. Bring a current list of your medications, and the last time and date medication taken. 9. Bring ID and current insurance cards. 10.Please wear clothes that are easy to get on and off and wear slip-on shoes.  Thank you for allowing  [place] to care for you!   -- [place] [facility]

## 2022-07-08 LAB — BASIC METABOLIC PANEL
BUN/Creatinine Ratio: 24 (ref 10–24)
BUN: 23 mg/dL (ref 8–27)
CO2: 21 mmol/L (ref 20–29)
Calcium: 9.8 mg/dL (ref 8.6–10.2)
Chloride: 101 mmol/L (ref 96–106)
Creatinine, Ser: 0.97 mg/dL (ref 0.76–1.27)
Glucose: 98 mg/dL (ref 70–99)
Potassium: 4.5 mmol/L (ref 3.5–5.2)
Sodium: 140 mmol/L (ref 134–144)
eGFR: 83 mL/min/{1.73_m2} (ref >59)

## 2022-07-08 LAB — CBC
Hematocrit: 46.3 % (ref 37.5–51.0)
Hemoglobin: 14.9 g/dL (ref 13.0–17.7)
MCH: 28.4 pg (ref 26.6–33.0)
MCHC: 32.2 g/dL (ref 31.5–35.7)
MCV: 88 fL (ref 79–97)
Platelets: 262 10*3/uL (ref 150–450)
RBC: 5.24 x10E6/uL (ref 4.14–5.80)
RDW: 13.6 % (ref 11.6–15.4)
WBC: 8.1 10*3/uL (ref 3.4–10.8)

## 2022-07-13 ENCOUNTER — Telehealth: Payer: Self-pay | Admitting: *Deleted

## 2022-07-13 NOTE — Telephone Encounter (Signed)
Cardiac Catheterization scheduled at [facility] for: Friday July 14, 2022 10:30 AM Arrival time and place: [facility] Main Entrance A at: 8:30 AM   Nothing to eat after midnight prior to procedure, clear liquids until 5 AM day of procedure.  Medication instructions: -Hold:  HCTZ-AM of procedure   Insulin -AM of procedure -Except hold medications usual morning medications can be taken with sips of water including aspirin 81 mg.  Confirmed patient has responsible adult to drive home post procedure and be with patient first 24 hours after arriving home.  Patient reports no new symptoms concerning for COVID-19 in the past 10 days.  Reviewed procedure instructions with patient.

## 2022-07-14 ENCOUNTER — Other Ambulatory Visit: Payer: Self-pay

## 2022-07-14 ENCOUNTER — Encounter (HOSPITAL_COMMUNITY)
Admission: RE | Disposition: A | Discharge status: Home / Self Care | Payer: Self-pay | Source: Home / Self Care | Attending: Internal Medicine

## 2022-07-14 ENCOUNTER — Ambulatory Visit (HOSPITAL_COMMUNITY)
Admission: RE | Admit: 2022-07-14 | Discharge: 2022-07-14 | Disposition: A | Discharge status: Home / Self Care | Payer: Medicare Other | Attending: Internal Medicine | Admitting: Internal Medicine

## 2022-07-14 ENCOUNTER — Ambulatory Visit (HOSPITAL_BASED_OUTPATIENT_CLINIC_OR_DEPARTMENT_OTHER): Payer: Medicare Other

## 2022-07-14 DIAGNOSIS — I2510 Atherosclerotic heart disease of native coronary artery without angina pectoris: Secondary | ICD-10-CM | POA: Diagnosis not present

## 2022-07-14 DIAGNOSIS — Z7982 Long term (current) use of aspirin: Secondary | ICD-10-CM | POA: Insufficient documentation

## 2022-07-14 DIAGNOSIS — I25118 Atherosclerotic heart disease of native coronary artery with other forms of angina pectoris: Secondary | ICD-10-CM | POA: Insufficient documentation

## 2022-07-14 DIAGNOSIS — R931 Abnormal findings on diagnostic imaging of heart and coronary circulation: Secondary | ICD-10-CM | POA: Diagnosis not present

## 2022-07-14 DIAGNOSIS — I272 Pulmonary hypertension, unspecified: Secondary | ICD-10-CM | POA: Diagnosis not present

## 2022-07-14 DIAGNOSIS — Z794 Long term (current) use of insulin: Secondary | ICD-10-CM | POA: Diagnosis not present

## 2022-07-14 DIAGNOSIS — R0609 Other forms of dyspnea: Secondary | ICD-10-CM | POA: Insufficient documentation

## 2022-07-14 DIAGNOSIS — I2584 Coronary atherosclerosis due to calcified coronary lesion: Secondary | ICD-10-CM | POA: Insufficient documentation

## 2022-07-14 DIAGNOSIS — E119 Type 2 diabetes mellitus without complications: Secondary | ICD-10-CM | POA: Insufficient documentation

## 2022-07-14 DIAGNOSIS — Z87891 Personal history of nicotine dependence: Secondary | ICD-10-CM | POA: Insufficient documentation

## 2022-07-14 DIAGNOSIS — E782 Mixed hyperlipidemia: Secondary | ICD-10-CM | POA: Diagnosis not present

## 2022-07-14 DIAGNOSIS — I700 Atherosclerosis of aorta: Secondary | ICD-10-CM | POA: Insufficient documentation

## 2022-07-14 HISTORY — PX: INTRAVASCULAR PRESSURE WIRE/FFR STUDY: CATH118243

## 2022-07-14 HISTORY — PX: RIGHT/LEFT HEART CATH AND CORONARY ANGIOGRAPHY: CATH118266

## 2022-07-14 LAB — POCT I-STAT 7, (LYTES, BLD GAS, ICA,H+H)
Acid-base deficit: 1 mmol/L (ref 0.0–2.0)
Bicarbonate: 23.2 mmol/L (ref 20.0–28.0)
Calcium, Ion: 1.22 mmol/L (ref 1.15–1.40)
HCT: 41 % (ref 39.0–52.0)
Hemoglobin: 13.9 g/dL (ref 13.0–17.0)
O2 Saturation: 94 %
Potassium: 3.8 mmol/L (ref 3.5–5.1)
Sodium: 139 mmol/L (ref 135–145)
TCO2: 24 mmol/L (ref 22–32)
pCO2 arterial: 37.8 mmHg (ref 32–48)
pH, Arterial: 7.395 (ref 7.35–7.45)
pO2, Arterial: 71 mmHg — ABNORMAL LOW (ref 83–108)

## 2022-07-14 LAB — ECHOCARDIOGRAM COMPLETE
Area-P 1/2: 2.79 cm2
Height: 67 in
S' Lateral: 2.5 cm
Weight: 3328 oz

## 2022-07-14 LAB — POCT I-STAT EG7
Acid-Base Excess: 0 mmol/L (ref 0.0–2.0)
Bicarbonate: 25.4 mmol/L (ref 20.0–28.0)
Calcium, Ion: 1.2 mmol/L (ref 1.15–1.40)
HCT: 41 % (ref 39.0–52.0)
Hemoglobin: 13.9 g/dL (ref 13.0–17.0)
O2 Saturation: 73 %
Potassium: 3.7 mmol/L (ref 3.5–5.1)
Sodium: 139 mmol/L (ref 135–145)
TCO2: 27 mmol/L (ref 22–32)
pCO2, Ven: 44.6 mmHg (ref 44–60)
pH, Ven: 7.365 (ref 7.25–7.43)
pO2, Ven: 40 mmHg (ref 32–45)

## 2022-07-14 LAB — POCT ACTIVATED CLOTTING TIME: Activated Clotting Time: 269 seconds

## 2022-07-14 LAB — GLUCOSE, CAPILLARY
Glucose-Capillary: 186 mg/dL — ABNORMAL HIGH (ref 70–99)
Glucose-Capillary: 157 mg/dL — ABNORMAL HIGH (ref 70–99)

## 2022-07-14 SURGERY — RIGHT/LEFT HEART CATH AND CORONARY ANGIOGRAPHY
Anesthesia: LOCAL

## 2022-07-14 MED ORDER — SODIUM CHLORIDE 0.9 % IV SOLN: INTRAVENOUS | Status: DC

## 2022-07-14 MED ORDER — SODIUM CHLORIDE 0.9% FLUSH
3.0000 mL | Freq: Two times a day (BID) | INTRAVENOUS | Status: DC
Start: 2022-07-14 — End: 2022-07-14

## 2022-07-14 MED ORDER — SODIUM CHLORIDE 0.9% FLUSH: 3.0000 mL | Freq: Two times a day (BID) | INTRAVENOUS | Status: DC

## 2022-07-14 MED ORDER — MIDAZOLAM HCL 2 MG/2ML IJ SOLN
INTRAMUSCULAR | Status: AC
  Filled 2022-07-14: qty 2

## 2022-07-14 MED ORDER — FENTANYL CITRATE (PF) 100 MCG/2ML IJ SOLN
INTRAMUSCULAR | Status: AC
  Filled 2022-07-14: qty 2

## 2022-07-14 MED ORDER — LIDOCAINE HCL (PF) 1 % IJ SOLN
INTRAMUSCULAR | Status: DC | PRN
  Administered 2022-07-14 (×3): 2 mL

## 2022-07-14 MED ORDER — IOHEXOL 350 MG/ML SOLN
INTRAVENOUS | Status: DC | PRN
  Administered 2022-07-14: 100 mL

## 2022-07-14 MED ORDER — VERAPAMIL HCL 2.5 MG/ML IV SOLN
INTRAVENOUS | Status: DC | PRN
  Administered 2022-07-14 (×2): 10 mL via INTRA_ARTERIAL

## 2022-07-14 MED ORDER — SODIUM CHLORIDE 0.9 % WEIGHT BASED INFUSION
3.0000 mL/kg/h | INTRAVENOUS | Status: AC
  Administered 2022-07-14: 3 mL/kg/h via INTRAVENOUS

## 2022-07-14 MED ORDER — SODIUM CHLORIDE 0.9 % WEIGHT BASED INFUSION
1.0000 mL/kg/h | INTRAVENOUS | Status: DC
Start: 2022-07-14 — End: 2022-07-14

## 2022-07-14 MED ORDER — SODIUM CHLORIDE 0.9 % IV SOLN: 250.0000 mL | INTRAVENOUS | Status: DC | PRN

## 2022-07-14 MED ORDER — NITROGLYCERIN 1 MG/10 ML FOR IR/CATH LAB
INTRA_ARTERIAL | Status: AC
  Filled 2022-07-14: qty 10

## 2022-07-14 MED ORDER — LABETALOL HCL 5 MG/ML IV SOLN: 10.0000 mg | INTRAVENOUS | Status: DC | PRN

## 2022-07-14 MED ORDER — METOPROLOL SUCCINATE ER 25 MG PO TB24: 25.0000 mg | ORAL_TABLET | Freq: Every day | ORAL | 11 refills | Status: DC

## 2022-07-14 MED ORDER — FENTANYL CITRATE (PF) 100 MCG/2ML IJ SOLN
INTRAMUSCULAR | Status: DC | PRN
  Administered 2022-07-14 (×2): 25 ug via INTRAVENOUS

## 2022-07-14 MED ORDER — HYDRALAZINE HCL 20 MG/ML IJ SOLN: 10.0000 mg | INTRAMUSCULAR | Status: DC | PRN

## 2022-07-14 MED ORDER — HEPARIN SODIUM (PORCINE) 1000 UNIT/ML IJ SOLN
INTRAMUSCULAR | Status: AC
  Filled 2022-07-14: qty 10

## 2022-07-14 MED ORDER — ASPIRIN 81 MG PO CHEW: 81.0000 mg | CHEWABLE_TABLET | ORAL | Status: DC

## 2022-07-14 MED ORDER — SODIUM CHLORIDE 0.9% FLUSH: 3.0000 mL | INTRAVENOUS | Status: DC | PRN

## 2022-07-14 MED ORDER — HEPARIN (PORCINE) IN NACL 1000-0.9 UT/500ML-% IV SOLN
INTRAVENOUS | Status: DC | PRN
  Administered 2022-07-14 (×2): 500 mL

## 2022-07-14 MED ORDER — MIDAZOLAM HCL 2 MG/2ML IJ SOLN
INTRAMUSCULAR | Status: DC | PRN
  Administered 2022-07-14 (×2): 1 mg via INTRAVENOUS

## 2022-07-14 MED ORDER — PERFLUTREN LIPID MICROSPHERE
1.0000 mL | INTRAVENOUS | Status: AC | PRN
  Administered 2022-07-14: 2 mL via INTRAVENOUS

## 2022-07-14 MED ORDER — HEPARIN SODIUM (PORCINE) 1000 UNIT/ML IJ SOLN
INTRAMUSCULAR | Status: DC | PRN
  Administered 2022-07-14: 1000 [IU] via INTRAVENOUS
  Administered 2022-07-14: 4500 [IU] via INTRAVENOUS
  Administered 2022-07-14: 5000 [IU] via INTRAVENOUS

## 2022-07-14 MED ORDER — ONDANSETRON HCL 4 MG/2ML IJ SOLN: 4.0000 mg | Freq: Four times a day (QID) | INTRAMUSCULAR | Status: DC | PRN

## 2022-07-14 MED ORDER — NITROGLYCERIN 1 MG/10 ML FOR IR/CATH LAB
INTRA_ARTERIAL | Status: DC | PRN
  Administered 2022-07-14: 200 ug via INTRACORONARY

## 2022-07-14 MED ORDER — HEPARIN (PORCINE) IN NACL 1000-0.9 UT/500ML-% IV SOLN
INTRAVENOUS | Status: AC
  Filled 2022-07-14: qty 1000

## 2022-07-14 MED ORDER — ACETAMINOPHEN 325 MG PO TABS: 650.0000 mg | ORAL_TABLET | ORAL | Status: DC | PRN

## 2022-07-14 MED ORDER — LIDOCAINE HCL (PF) 1 % IJ SOLN
INTRAMUSCULAR | Status: AC
  Filled 2022-07-14: qty 30

## 2022-07-14 MED ORDER — VERAPAMIL HCL 2.5 MG/ML IV SOLN
INTRAVENOUS | Status: AC
  Filled 2022-07-14: qty 2

## 2022-07-14 MED ORDER — NITROGLYCERIN 0.4 MG SL SUBL: 0.4000 mg | SUBLINGUAL_TABLET | SUBLINGUAL | 99 refills | Status: DC | PRN

## 2022-07-14 SURGICAL SUPPLY — 16 items
BAND ZEPHYR COMPRESS 30 LONG (HEMOSTASIS) ×1 IMPLANT
CATH 5FR JL3.5 JR4 ANG PIG MP (CATHETERS) ×1 IMPLANT
CATH BALLN WEDGE 5F 110CM (CATHETERS) ×1 IMPLANT
CATH LAUNCHER 6FR EBU3.5 (CATHETERS) ×1 IMPLANT
ELECT DEFIB PAD ADLT CADENCE (PAD) ×1 IMPLANT
GLIDESHEATH SLEND SS 6F .021 (SHEATH) ×1 IMPLANT
GUIDEWIRE INQWIRE 1.5J.035X260 (WIRE) IMPLANT
GUIDEWIRE PRESSURE X 175 (WIRE) ×1 IMPLANT
INQWIRE 1.5J .035X260CM (WIRE) ×2
KIT ESSENTIALS PG (KITS) ×1 IMPLANT
KIT HEART LEFT (KITS) ×2 IMPLANT
PACK CARDIAC CATHETERIZATION (CUSTOM PROCEDURE TRAY) ×2 IMPLANT
SHEATH GLIDE SLENDER 4/5FR (SHEATH) ×2 IMPLANT
SHEATH PROBE COVER 6X72 (BAG) ×1 IMPLANT
TRANSDUCER W/STOPCOCK (MISCELLANEOUS) ×2 IMPLANT
TUBING CIL FLEX 10 FLL-RA (TUBING) ×2 IMPLANT

## 2022-07-14 NOTE — Interval H&P Note (Signed)
History and Physical Interval Note:  07/14/2022 10:22 AM  Antonio Patterson  has presented today for surgery, with the diagnosis of dyspnea on exertion and abnormal cardiac CT angiography.  The various methods of treatment have been discussed with the patient and family. After consideration of risks, benefits and other options for treatment, the patient has consented to  Procedure(s): RIGHT/LEFT HEART CATH AND CORONARY ANGIOGRAPHY (N/A) as a surgical intervention.  The patient's history has been reviewed, patient examined, no change in status, stable for surgery.  I have reviewed the patient's chart and labs.  Questions were answered to the patient's satisfaction.    Cath Lab Visit (complete for each Cath Lab visit)  Clinical Evaluation Leading to the Procedure:   ACS: No.  Non-ACS:    Anginal/Heart Failure Classification: NYHA class III  Anti-ischemic medical therapy: Minimal Therapy (1 class of medications)  Non-Invasive Test Results: Equivocal test results Coronary CTA shows at least moderate multivessel CAD with severe calcification.  Prior CABG: No previous CABG  Antonio Patterson

## 2022-07-14 NOTE — Brief Op Note (Signed)
BRIEF CARDIAC CATHETERIZATION NOTE  DATE: 07/14/2022  TIME: 12:50 PM  PATIENT:  Antonio Patterson  71 y.o. male  PRE-OPERATIVE DIAGNOSIS: Dyspnea on exertion and abnormal coronary CTA  POST-OPERATIVE DIAGNOSIS:  Multivessel CAD  PROCEDURE:  Procedure(s): RIGHT/LEFT HEART CATH AND CORONARY ANGIOGRAPHY (N/A) INTRAVASCULAR PRESSURE WIRE/FFR STUDY (N/A)  SURGEON:  Surgeon(s) and Role:    * Coley Littles, MD - Primary  CONCLUSIONS: Significant 3-vessel CAD including moderate distal LMCA and moderate diffuse proximal/mid LAD disease that is hemodynamically significant (RFR = 0.84), as well as 80-90% ostial LCx and proximal RCA lesions with heavy calcification. Normal LVEF. Mild-moderate pulmonary hypertension. Normal right and left heart filling pressures. Normal Fick cardiac output/index.  RECOMMENDATIONS: Obtain echo. Outpatient cardiac surgery consultation for CABG. Start metoprolol succinate 25 mg daily for antianginal therapy. Aggressive secondary prevention of CAD.  Yvonne Kendall, MD [facility]

## 2022-07-14 NOTE — Progress Notes (Signed)
Reverse Barbeau Test performed per protocol, Type A.

## 2022-07-17 ENCOUNTER — Encounter (HOSPITAL_COMMUNITY): Payer: Self-pay | Admitting: Internal Medicine

## 2022-08-02 NOTE — Progress Notes (Signed)
Cardiology Office Note:   Date:  08/04/2022  NAME:  Antonio Patterson    MRN: [id number] DOB:  [birth year]   PCP:  Dema Severin, NP  Cardiologist:  None  Electrophysiologist:  None   Referring MD: Dema Severin, NP   Chief Complaint  Patient presents with   Follow-up        History of Present Illness:   Antonio Patterson is a 71 y.o. male with a hx of diabetes, hypertension, CAD who presents for follow-up.  Left heart catheterization showed three-vessel CAD.  CABG referral was made.  Echocardiogram showed normal LV function.  Of note he was found to have mild to moderate pulmonary hypertension at the time of heart catheterization. He reports he is doing well since his heart catheterization.  Right radial cath site is clean and dry with 2+ pulse.  His blood pressure is well-controlled.  He was not able to tolerate Lipitor.  He had muscle cramping.  Cannot tolerate Crestor either.  We will likely go to PCSK9 inhibitor therapy after bypass surgery.  We discussed the results of his heart catheterization.  CABG is recommended which I agree with.  His EKG shows no acute ischemic changes.  Echo shows normal LV and RV function.  He did have mild to moderate pulmonary hypertension which I suspect is related to untreated sleep apnea.  We will likely set him up for a sleep study after his bypass surgery.  He is working on his diabetes.  Problem List CAD --severe 3v CAD 2. Mild to moderate pHTN -normal PCWP -OSA? 3. DM -A1c 6.8 4. 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   UNSPECIFIED PERIPHERAL VASCULAR DISEASE  11/13/2008    Past Surgical History: Past Surgical History:  Procedure Laterality Date   INTRAVASCULAR PRESSURE WIRE/FFR STUDY N/A 07/14/2022   Procedure: INTRAVASCULAR PRESSURE WIRE/FFR STUDY;  Surgeon: Yvonne Kendall, MD;  Location: [facility];  Service: Cardiovascular;  Laterality: N/A;   RIGHT/LEFT HEART CATH AND CORONARY ANGIOGRAPHY N/A 07/14/2022   Procedure: RIGHT/LEFT HEART CATH AND CORONARY ANGIOGRAPHY;  Surgeon: Yvonne Kendall, MD;  Location: [facility];  Service: Cardiovascular;  Laterality: N/A;   TURBINATE REDUCTION Bilateral 2017   VASECTOMY  1976    Current Medications: Current Meds  Medication Sig   acetaminophen (TYLENOL) 650 MG CR tablet Take 1,300 mg by mouth every 8 (eight) hours as needed for pain.   amLODipine (NORVASC) 5 MG tablet Take 1 tablet (5 mg total) by mouth daily. **PATIENT NEEDS APT FOR FURTHER REFILLS**   aspirin 81 MG tablet Take 81 mg by mouth 2 (two) times daily.   glucose blood test strip 1 each by Other route in the morning, at noon, in the evening, and at bedtime.    hydrochlorothiazide (MICROZIDE) 12.5 MG capsule Take 1 capsule by mouth once daily   insulin aspart (NOVOLOG FLEXPEN RELION) 100 UNIT/ML FlexPen Inject 5-40 Units into the skin See admin instructions. 3-5 times per day, and pen needles 1/day   loratadine-pseudoephedrine (CLARITIN-D 24 HOUR) 10-240 MG 24 hr tablet Take 1 tablet by mouth daily.  losartan (COZAAR) 100 MG tablet Take 100 mg by mouth daily.   metoprolol succinate (TOPROL XL) 25 MG 24 hr tablet Take 1 tablet (25 mg total) by mouth daily.   nitroGLYCERIN (NITROSTAT) 0.4 MG SL tablet Place 1 tablet (0.4 mg total) under the tongue every 5 (five) minutes as needed for chest pain.   omeprazole (PRILOSEC) 40 MG capsule Take 40 mg by mouth daily.   oxymetazoline (AFRIN) 0.05 % nasal spray Place 1 spray into both nostrils 2 (two) times daily.   simvastatin (ZOCOR) 40 MG tablet Take 40 mg by mouth daily.    tamsulosin (FLOMAX) 0.4 MG CAPS capsule Take 1 capsule (0.4 mg total) by mouth daily.   traMADol (ULTRAM) 50 MG tablet TAKE 1 TABLET BY MOUTH EVERY 8 HOURS AS NEEDED   Vitamin D, Ergocalciferol, (DRISDOL) 1.25 MG (50000 UT) CAPS capsule Take one tablet wkly     Allergies:    Lipitor [atorvastatin], Metformin, Rosuvastatin, Sulfa antibiotics, and Sulfonamide derivatives   Social History: Social History   Socioeconomic History   Marital status: Married    Spouse name: Not on file   Number of children: 1   Years of education: Not on file   Highest education level: Not on file  Occupational History    Employer: [employer]    Comment: Works for [employer]   Occupation: Engineering geologist  Tobacco Use   Smoking status: Former    Packs/day: 1.00    Years: 10.00    Total pack years: 10.00    Types: Cigarettes    Quit date: 1971    Years since quitting: 52.6   Smokeless tobacco: Never  Vaping Use   Vaping Use: Never used  Substance and Sexual Activity   Alcohol use: Yes    Comment: 6/week   Drug use: Never   Sexual activity: Not Currently  Other Topics Concern   Not on file  Social History Narrative   Not on file   Social Determinants of Health   Financial Resource Strain: Not on file  Food Insecurity: Not on file  Transportation Needs: Not on file  Physical Activity: Not on file  Stress: Not on file  Social Connections: Not on file     Family History: The patient's family history includes Cancer in his maternal grandmother and paternal grandfather; Heart disease in his father; Stroke in his maternal grandfather.  ROS:   All other ROS reviewed and negative. Pertinent positives noted in the HPI.     EKGs/Labs/Other Studies Reviewed:   The following studies were personally reviewed by me today:  EKG:  EKG is ordered today.  The ekg ordered today demonstrates normal sinus rhythm heart rate 71, nonspecific EKG changes, and was personally  reviewed by me.   TTE 07/14/2022  1. Left ventricular ejection fraction, by estimation, is 60 to 65%. The  left ventricle has normal function. The left ventricle has no regional  wall motion abnormalities. Left ventricular diastolic parameters were  normal.   2. Right ventricular systolic function is normal. The right ventricular  size is normal.   3. The mitral valve is abnormal. No evidence of mitral valve  regurgitation. No evidence of mitral stenosis.   4. The aortic valve is tricuspid. There is mild calcification of the  aortic valve. There is mild thickening of the aortic valve. Aortic valve  regurgitation is not visualized. Aortic valve sclerosis is present, with  no evidence of aortic valve stenosis.   5. The  inferior vena cava is normal in size with greater than 50%  respiratory variability, suggesting right atrial pressure of 3 mmHg.   LHC/RHC  Conclusions: Significant three-vessel coronary artery disease including moderate distal LMCA and moderate diffuse proximal/mid LAD disease that is hemodynamically significant (RFR = 0.84), as well as 90% ostial LCx and 95% proximal RCA lesions with heavy calcification. Normal left ventricular systolic function. Mild-moderate pulmonary hypertension (mean PA 26 mmHg, PA SBP 50 mmHg, PVR 2.7 WU). Normal right and left heart filling pressures (PCWP 10 mmHg, LVEDP 12 mmHg, mean RA 4 mmHg). Normal Fick cardiac output/index (CO 6.4 L/min, CI 3.1 L/min/m^2).  Recent Labs: 05/26/2022: BNP 15.6; TSH 2.410 07/07/2022: BUN 23; Creatinine, Ser 0.97; Platelets 262 07/14/2022: Hemoglobin 13.9; Potassium 3.8; Sodium 139   Recent Lipid Panel    Component Value Date/Time   CHOL 140 02/28/2019 0914   TRIG 51 02/28/2019 0914   HDL 51 02/28/2019 0914   CHOLHDL 2.7 02/28/2019 0914   CHOLHDL 3 02/22/2018 1134   VLDL 7.6 02/22/2018 1134   LDLCALC 79 02/28/2019 0914    Physical Exam:   VS:  BP 122/72   Pulse 71   Ht 5\' 7"  (1.702 m)   Wt 205 lb (93  kg)   SpO2 95%   BMI 32.11 kg/m    Wt Readings from Last 3 Encounters:  08/04/22 205 lb (93 kg)  07/14/22 208 lb (94.3 kg)  07/07/22 209 lb 3.2 oz (94.9 kg)    General: Well nourished, well developed, in no acute distress Head: Atraumatic, normal size  Eyes: PEERLA, EOMI  Neck: Supple, no JVD Endocrine: No thryomegaly Cardiac: Normal S1, S2; RRR; no murmurs, rubs, or gallops Lungs: Clear to auscultation bilaterally, no wheezing, rhonchi or rales  Abd: Soft, nontender, no hepatomegaly  Ext: No edema, pulses 2+ Musculoskeletal: No deformities, BUE and BLE strength normal and equal Skin: Warm and dry, no rashes   Neuro: Alert and oriented to person, place, time, and situation, CNII-XII grossly intact, no focal deficits  Psych: Normal mood and affect   ASSESSMENT:   Antonio Patterson is a 71 y.o. male who presents for the following: 1. Coronary artery disease involving native coronary artery of native heart without angina pectoris   2. Mixed hyperlipidemia   3. Pulmonary hypertension, unspecified (HCC)     PLAN:   1. Coronary artery disease involving native coronary artery of native heart without angina pectoris -Recent heart catheterization with 95% proximal RCA lesion, 40% left main lesion, 40% LAD, 90% ostial circumflex lesion.  He effectively has significant left main disease.  No options for percutaneous intervention.  CABG is recommended which I agree with. -His symptoms are exertional shortness of breath.  Symptoms are stable.  Outpatient CABG evaluation next week. -Continue aspirin 81 mg daily.  He has nitro as as needed.  His blood pressure is well-controlled.  He is on metoprolol 25 mg daily. -He cannot tolerate Lipitor or Crestor.  We will put him back on simvastatin 40 mg daily.  Will likely refer him for PCSK9 inhibitor therapy as an outpatient after bypass surgery. -He will see me in 3 months after his surgery.  2. Mixed hyperlipidemia -Go back on simvastatin.  Likely  will pursue outpatient PCSK9 inhibitor therapy.  3. Pulmonary hypertension, unspecified (HCC) -Diagnosis of pulm hypertension was made at time of right heart catheterization.  This is not related to left heart failure.  He has a normal wedge pressure.  He does snore.  He has  fatigue.  I suspect he has untreated sleep apnea.  We will set him up for a sleep study after his bypass surgery.  Disposition: Return in about 3 months (around 11/04/2022).  Medication Adjustments/Labs and Tests Ordered: Current medicines are reviewed at length with the patient today.  Concerns regarding medicines are outlined above.  Orders Placed This Encounter  Procedures   EKG 12-Lead   No orders of the defined types were placed in this encounter.   Patient Instructions  Medication Instructions:  STOP Lipitor Call us with the dose of Simvastatin   *If you need a refill on your cardiac medications before your next appointment, please call your pharmacy*   Follow-Up: At [facility], you and your health needs are our priority.  As part of our continuing mission to provide you with exceptional heart care, we have created designated Provider Care Teams.  These Care Teams include your primary Cardiologist (physician) and Advanced Practice Providers (APPs -  Physician Assistants and Nurse Practitioners) who all work together to provide you with the care you need, when you need it.  We recommend signing up for the patient portal called "MyChart".  Sign up information is provided on this After Visit Summary.  MyChart is used to connect with patients for Virtual Visits (Telemedicine).  Patients are able to view lab/test results, encounter notes, upcoming appointments, etc.  Non-urgent messages can be sent to your provider as well.   To learn more about what you can do with MyChart, go to [email or web address].    Your next appointment:   3 month(s)  The format for your next appointment:   In Person  Provider:    Lennie Odor, MD           Time Spent with Patient: I have spent a total of 35 minutes with patient reviewing hospital notes, telemetry, EKGs, labs and examining the patient as well as establishing an assessment and plan that was discussed with the patient.  > 50% of time was spent in direct patient care.  Signed, Lenna Gilford. Flora Lipps, MD, [facility]  [facility]  [address] [place], [place] [address] [phone]  08/04/2022 9:16 AM

## 2022-08-04 ENCOUNTER — Encounter: Payer: Self-pay | Admitting: Cardiovascular Disease

## 2022-08-04 ENCOUNTER — Ambulatory Visit (INDEPENDENT_AMBULATORY_CARE_PROVIDER_SITE_OTHER): Payer: Medicare Other | Admitting: Cardiovascular Disease

## 2022-08-04 VITALS — BP 122/72 | HR 71 | Ht 67.0 in | Wt 205.0 lb

## 2022-08-04 DIAGNOSIS — I2510 Atherosclerotic heart disease of native coronary artery without angina pectoris: Secondary | ICD-10-CM | POA: Diagnosis not present

## 2022-08-04 DIAGNOSIS — I272 Pulmonary hypertension, unspecified: Secondary | ICD-10-CM | POA: Diagnosis not present

## 2022-08-04 DIAGNOSIS — E782 Mixed hyperlipidemia: Secondary | ICD-10-CM | POA: Diagnosis not present

## 2022-08-04 NOTE — Patient Instructions (Signed)
Medication Instructions:  STOP Lipitor Call us with the dose of Simvastatin   *If you need a refill on your cardiac medications before your next appointment, please call your pharmacy*   Follow-Up: At [facility], you and your health needs are our priority.  As part of our continuing mission to provide you with exceptional heart care, we have created designated Provider Care Teams.  These Care Teams include your primary Cardiologist (physician) and Advanced Practice Providers (APPs -  Physician Assistants and Nurse Practitioners) who all work together to provide you with the care you need, when you need it.  We recommend signing up for the patient portal called "MyChart".  Sign up information is provided on this After Visit Summary.  MyChart is used to connect with patients for Virtual Visits (Telemedicine).  Patients are able to view lab/test results, encounter notes, upcoming appointments, etc.  Non-urgent messages can be sent to your provider as well.   To learn more about what you can do with MyChart, go to [email or web address].    Your next appointment:   3 month(s)  The format for your next appointment:   In Person  Provider:   Lennie Odor, MD

## 2022-08-07 NOTE — Progress Notes (Signed)
[address]       [place] [address]             [phone]        Antonio Patterson Date of Birth: 01-16-1951  Referring: Yvonne Kendall, MD Primary Care: Dema Severin, NP Primary Cardiologist:None  Chief Complaint:    Chief Complaint  Patient presents with   Coronary Artery Disease    Surgical consult, Cardiac cath and ECHO 07/14/22     History of Present Illness:     71 year old male presents for surgical evaluation of left main/three-vessel coronary artery disease.  Over the past several months he has had some exertional dyspnea and ultimately underwent a left heart catheterization which identified the disease.  He also admits to occasionally having some palpitations, but this resolves with deep breathing.  He denies any chest pain.  He denies any lower extremity swelling.   Past Medical and Surgical History: Previous Chest Surgery: No Previous Chest Radiation: No Diabetes Mellitus: Yes.  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   UNSPECIFIED PERIPHERAL VASCULAR DISEASE 11/13/2008    Past Surgical History:  Procedure Laterality Date   INTRAVASCULAR PRESSURE WIRE/FFR STUDY N/A 07/14/2022   Procedure: INTRAVASCULAR PRESSURE WIRE/FFR STUDY;  Surgeon: Yvonne Kendall, MD;  Location: [facility];  Service: Cardiovascular;  Laterality: N/A;   RIGHT/LEFT HEART CATH AND CORONARY ANGIOGRAPHY N/A 07/14/2022   Procedure: RIGHT/LEFT HEART CATH AND CORONARY ANGIOGRAPHY;  Surgeon: Yvonne Kendall, MD;  Location: [facility];  Service: Cardiovascular;   Laterality: N/A;   TURBINATE REDUCTION Bilateral 2017   VASECTOMY  1976    Social History: Support: He lives with his wife  Social History   Tobacco Use  Smoking Status Former   Packs/day: 1.00   Years: 10.00   Total pack years: 10.00   Types: Cigarettes   Quit date: 1971   Years since quitting: 52.6  Smokeless Tobacco Never    Social History   Substance and Sexual Activity  Alcohol Use Yes   Comment: 6/week     Allergies  Allergen Reactions   Lipitor [Atorvastatin]     Muscle pain   Metformin Diarrhea   Rosuvastatin     Muscle pain   Sulfa Antibiotics     Unknown reaction   Sulfonamide Derivatives     Unknown reaction      Current Outpatient Medications  Medication Sig Dispense Refill   acetaminophen (TYLENOL) 650 MG CR tablet Take 1,300 mg by mouth every 8 (eight) hours as needed for pain.     amLODipine (NORVASC) 5 MG tablet Take 1 tablet (5 mg total) by mouth daily. **PATIENT NEEDS APT FOR FURTHER REFILLS** 30 tablet 0   aspirin 81 MG tablet Take 81 mg by mouth 2 (two) times daily.     glucose blood test strip 1 each by Other route in the morning, at noon, in the evening, and at bedtime.      hydrochlorothiazide (MICROZIDE) 12.5 MG capsule Take 1 capsule by mouth once daily 90 capsule 0   insulin aspart (NOVOLOG FLEXPEN RELION) 100 UNIT/ML FlexPen Inject 5-40  Units into the skin See admin instructions. 3-5 times per day, and pen needles 1/day 45 mL 11   loratadine-pseudoephedrine (CLARITIN-D 24 HOUR) 10-240 MG 24 hr tablet Take 1 tablet by mouth daily. 90 tablet 0   losartan (COZAAR) 100 MG tablet Take 100 mg by mouth daily.     metoprolol succinate (TOPROL XL) 25 MG 24 hr tablet Take 1 tablet (25 mg total) by mouth daily. 30 tablet 11   nitroGLYCERIN (NITROSTAT) 0.4 MG SL tablet Place 1 tablet (0.4 mg total) under the tongue every 5 (five) minutes as needed for chest pain. 25 tablet PRN   omeprazole (PRILOSEC) 40 MG capsule Take 40 mg by mouth daily.      oxymetazoline (AFRIN) 0.05 % nasal spray Place 1 spray into both nostrils 2 (two) times daily.     simvastatin (ZOCOR) 40 MG tablet Take 40 mg by mouth daily.     tamsulosin (FLOMAX) 0.4 MG CAPS capsule Take 1 capsule (0.4 mg total) by mouth daily. 90 capsule 1   traMADol (ULTRAM) 50 MG tablet TAKE 1 TABLET BY MOUTH EVERY 8 HOURS AS NEEDED 50 tablet 4   Vitamin D, Ergocalciferol, (DRISDOL) 1.25 MG (50000 UT) CAPS capsule Take one tablet wkly 12 capsule 3   fluticasone (FLONASE) 50 MCG/ACT nasal spray Place 2 sprays into the nose daily. 16 g 11   No current facility-administered medications for this visit.    (Not in a hospital admission)   Family History  Problem Relation Age of Onset   Heart disease Father    Cancer Maternal Grandmother        breast   Stroke Maternal Grandfather    Cancer Paternal Grandfather      Review of Systems:   Review of Systems  Constitutional:  Positive for malaise/fatigue.  Respiratory:  Positive for shortness of breath.   Cardiovascular:  Positive for palpitations. Negative for chest pain.  Neurological: Negative.       Physical Exam: BP 126/71   Pulse 80   Resp 20   Ht 5\' 7"  (1.702 m)   Wt 208 lb (94.3 kg)   SpO2 93% Comment: RA  BMI 32.58 kg/m  Physical Exam Constitutional:      Appearance: Normal appearance. He is normal weight.  Eyes:     Extraocular Movements: Extraocular movements intact.  Cardiovascular:     Rate and Rhythm: Normal rate.  Pulmonary:     Effort: Pulmonary effort is normal. No respiratory distress.  Abdominal:     General: Abdomen is flat. There is no distension.  Musculoskeletal:        General: Normal range of motion.     Cervical back: Normal range of motion.  Skin:    General: Skin is warm and dry.  Neurological:     General: No focal deficit present.     Mental Status: He is alert and oriented to person, place, and time.       Diagnostic Studies & Laboratory data:    Left Heart  Catherization:  Intervention  Echo: IMPRESSIONS     1. Left ventricular ejection fraction, by estimation, is 60 to 65%. The  left ventricle has normal function. The left ventricle has no regional  wall motion abnormalities. Left ventricular diastolic parameters were  normal.   2. Right ventricular systolic function is normal. The right ventricular  size is normal.   3. The mitral valve is abnormal. No evidence of mitral valve  regurgitation. No evidence of mitral stenosis.  4. The aortic valve is tricuspid. There is mild calcification of the  aortic valve. There is mild thickening of the aortic valve. Aortic valve  regurgitation is not visualized. Aortic valve sclerosis is present, with  no evidence of aortic valve stenosis.   5. The inferior vena cava is normal in size with greater than 50%  respiratory variability, suggesting right atrial pressure of 3 mmHg.   EKG: sinus I have independently reviewed the above radiologic studies and discussed with the patient   Recent Lab Findings: Lab Results  Component Value Date   WBC 8.1 07/07/2022   HGB 13.9 07/14/2022   HCT 41.0 07/14/2022   PLT 262 07/07/2022   GLUCOSE 98 07/07/2022   CHOL 140 02/28/2019   TRIG 51 02/28/2019   HDL 51 02/28/2019   LDLCALC 79 02/28/2019   ALT 34 08/06/2020   AST 31 08/06/2020   NA 139 07/14/2022   K 3.8 07/14/2022   CL 101 07/07/2022   CREATININE 0.97 07/07/2022   BUN 23 07/07/2022   CO2 21 07/07/2022   TSH 2.410 05/26/2022   HGBA1C 6.9 (A) 01/26/2021      Assessment / Plan:   71 year old male with left main/three-vessel coronary artery disease.  He has preserved biventricular function and no significant valvular disease.  He has good distal targets on his PDA, LAD and obtuse marginal.  We discussed the risks and benefits of surgical revascularization and he is agreeable to proceed.  He is tentatively scheduled for August 17, 2022.    I  spent 40 minutes counseling the patient face to  face.   Corliss Skains 08/11/2022 10:28 AM

## 2022-08-07 NOTE — H&P (View-Only) (Signed)
    [address]       [place] [address]             [phone]        Antonio Patterson Manning Medical Record #[id number] Date of Birth: 03/24/1951  Referring: End, Christopher, MD Primary Care: York, Regina F, NP Primary Cardiologist:None  Chief Complaint:    Chief Complaint  Patient presents with   Coronary Artery Disease    Surgical consult, Cardiac cath and ECHO 07/14/22     History of Present Illness:     71-year-old male presents for surgical evaluation of left main/three-vessel coronary artery disease.  Over the past several months he has had some exertional dyspnea and ultimately underwent a left heart catheterization which identified the disease.  He also admits to occasionally having some palpitations, but this resolves with deep breathing.  He denies any chest pain.  He denies any lower extremity swelling.   Past Medical and Surgical History: Previous Chest Surgery: No Previous Chest Radiation: No Diabetes Mellitus: Yes.  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   UNSPECIFIED PERIPHERAL VASCULAR DISEASE 11/13/2008    Past Surgical History:  Procedure Laterality Date   INTRAVASCULAR PRESSURE WIRE/FFR STUDY N/A 07/14/2022   Procedure: INTRAVASCULAR PRESSURE WIRE/FFR STUDY;  Surgeon: End, Christopher, MD;  Location: [facility];  Service: Cardiovascular;  Laterality: N/A;   RIGHT/LEFT HEART CATH AND CORONARY ANGIOGRAPHY N/A 07/14/2022   Procedure: RIGHT/LEFT HEART CATH AND CORONARY ANGIOGRAPHY;  Surgeon: End, Christopher, MD;  Location: [facility];  Service: Cardiovascular;   Laterality: N/A;   TURBINATE REDUCTION Bilateral 2017   VASECTOMY  1976    Social History: Support: He lives with his wife  Social History   Tobacco Use  Smoking Status Former   Packs/day: 1.00   Years: 10.00   Total pack years: 10.00   Types: Cigarettes   Quit date: 1971   Years since quitting: 52.6  Smokeless Tobacco Never    Social History   Substance and Sexual Activity  Alcohol Use Yes   Comment: 6/week     Allergies  Allergen Reactions   Lipitor [Atorvastatin]     Muscle pain   Metformin Diarrhea   Rosuvastatin     Muscle pain   Sulfa Antibiotics     Unknown reaction   Sulfonamide Derivatives     Unknown reaction      Current Outpatient Medications  Medication Sig Dispense Refill   acetaminophen (TYLENOL) 650 MG CR tablet Take 1,300 mg by mouth every 8 (eight) hours as needed for pain.     amLODipine (NORVASC) 5 MG tablet Take 1 tablet (5 mg total) by mouth daily. **PATIENT NEEDS APT FOR FURTHER REFILLS** 30 tablet 0   aspirin 81 MG tablet Take 81 mg by mouth 2 (two) times daily.     glucose blood test strip 1 each by Other route in the morning, at noon, in the evening, and at bedtime.      hydrochlorothiazide (MICROZIDE) 12.5 MG capsule Take 1 capsule by mouth once daily 90 capsule 0   insulin aspart (NOVOLOG FLEXPEN RELION) 100 UNIT/ML FlexPen Inject 5-40   Units into the skin See admin instructions. 3-5 times per day, and pen needles 1/day 45 mL 11   loratadine-pseudoephedrine (CLARITIN-D 24 HOUR) 10-240 MG 24 hr tablet Take 1 tablet by mouth daily. 90 tablet 0   losartan (COZAAR) 100 MG tablet Take 100 mg by mouth daily.     metoprolol succinate (TOPROL XL) 25 MG 24 hr tablet Take 1 tablet (25 mg total) by mouth daily. 30 tablet 11   nitroGLYCERIN (NITROSTAT) 0.4 MG SL tablet Place 1 tablet (0.4 mg total) under the tongue every 5 (five) minutes as needed for chest pain. 25 tablet PRN   omeprazole (PRILOSEC) 40 MG capsule Take 40 mg by mouth daily.      oxymetazoline (AFRIN) 0.05 % nasal spray Place 1 spray into both nostrils 2 (two) times daily.     simvastatin (ZOCOR) 40 MG tablet Take 40 mg by mouth daily.     tamsulosin (FLOMAX) 0.4 MG CAPS capsule Take 1 capsule (0.4 mg total) by mouth daily. 90 capsule 1   traMADol (ULTRAM) 50 MG tablet TAKE 1 TABLET BY MOUTH EVERY 8 HOURS AS NEEDED 50 tablet 4   Vitamin D, Ergocalciferol, (DRISDOL) 1.25 MG (50000 UT) CAPS capsule Take one tablet wkly 12 capsule 3   fluticasone (FLONASE) 50 MCG/ACT nasal spray Place 2 sprays into the nose daily. 16 g 11   No current facility-administered medications for this visit.    (Not in a hospital admission)   Family History  Problem Relation Age of Onset   Heart disease Father    Cancer Maternal Grandmother        breast   Stroke Maternal Grandfather    Cancer Paternal Grandfather      Review of Systems:   Review of Systems  Constitutional:  Positive for malaise/fatigue.  Respiratory:  Positive for shortness of breath.   Cardiovascular:  Positive for palpitations. Negative for chest pain.  Neurological: Negative.       Physical Exam: BP 126/71   Pulse 80   Resp 20   Ht 5\' 7"  (1.702 m)   Wt 208 lb (94.3 kg)   SpO2 93% Comment: RA  BMI 32.58 kg/m  Physical Exam Constitutional:      Appearance: Normal appearance. He is normal weight.  Eyes:     Extraocular Movements: Extraocular movements intact.  Cardiovascular:     Rate and Rhythm: Normal rate.  Pulmonary:     Effort: Pulmonary effort is normal. No respiratory distress.  Abdominal:     General: Abdomen is flat. There is no distension.  Musculoskeletal:        General: Normal range of motion.     Cervical back: Normal range of motion.  Skin:    General: Skin is warm and dry.  Neurological:     General: No focal deficit present.     Mental Status: He is alert and oriented to person, place, and time.       Diagnostic Studies & Laboratory data:    Left Heart  Catherization:  Intervention  Echo: IMPRESSIONS     1. Left ventricular ejection fraction, by estimation, is 60 to 65%. The  left ventricle has normal function. The left ventricle has no regional  wall motion abnormalities. Left ventricular diastolic parameters were  normal.   2. Right ventricular systolic function is normal. The right ventricular  size is normal.   3. The mitral valve is abnormal. No evidence of mitral valve  regurgitation. No evidence of mitral stenosis.  4. The aortic valve is tricuspid. There is mild calcification of the  aortic valve. There is mild thickening of the aortic valve. Aortic valve  regurgitation is not visualized. Aortic valve sclerosis is present, with  no evidence of aortic valve stenosis.   5. The inferior vena cava is normal in size with greater than 50%  respiratory variability, suggesting right atrial pressure of 3 mmHg.   EKG: sinus I have independently reviewed the above radiologic studies and discussed with the patient   Recent Lab Findings: Lab Results  Component Value Date   WBC 8.1 07/07/2022   HGB 13.9 07/14/2022   HCT 41.0 07/14/2022   PLT 262 07/07/2022   GLUCOSE 98 07/07/2022   CHOL 140 02/28/2019   TRIG 51 02/28/2019   HDL 51 02/28/2019   LDLCALC 79 02/28/2019   ALT 34 08/06/2020   AST 31 08/06/2020   NA 139 07/14/2022   K 3.8 07/14/2022   CL 101 07/07/2022   CREATININE 0.97 07/07/2022   BUN 23 07/07/2022   CO2 21 07/07/2022   TSH 2.410 05/26/2022   HGBA1C 6.9 (A) 01/26/2021      Assessment / Plan:   71 year old male with left main/three-vessel coronary artery disease.  He has preserved biventricular function and no significant valvular disease.  He has good distal targets on his PDA, LAD and obtuse marginal.  We discussed the risks and benefits of surgical revascularization and he is agreeable to proceed.  He is tentatively scheduled for August 17, 2022.    I  spent 40 minutes counseling the patient face to  face.   Corliss Skains 08/11/2022 10:28 AM

## 2022-08-11 ENCOUNTER — Other Ambulatory Visit: Payer: Self-pay | Admitting: *Deleted

## 2022-08-11 ENCOUNTER — Institutional Professional Consult (permissible substitution) (INDEPENDENT_AMBULATORY_CARE_PROVIDER_SITE_OTHER): Payer: Medicare Other | Admitting: Thoracic Surgery (Cardiothoracic Vascular Surgery)

## 2022-08-11 ENCOUNTER — Encounter: Payer: Self-pay | Admitting: *Deleted

## 2022-08-11 VITALS — BP 126/71 | HR 80 | Resp 20 | Ht 67.0 in | Wt 208.0 lb

## 2022-08-11 DIAGNOSIS — I21A9 Other myocardial infarction type: Secondary | ICD-10-CM

## 2022-08-11 DIAGNOSIS — I2510 Atherosclerotic heart disease of native coronary artery without angina pectoris: Secondary | ICD-10-CM | POA: Diagnosis not present

## 2022-08-11 DIAGNOSIS — I5189 Other ill-defined heart diseases: Secondary | ICD-10-CM

## 2022-08-11 DIAGNOSIS — R079 Chest pain, unspecified: Secondary | ICD-10-CM

## 2022-08-14 NOTE — Pre-Procedure Instructions (Signed)
Surgical Instructions    Your procedure is scheduled on Thursday, August 23.  Report to [facility] Main Entrance "A" at 5:30 A.M., then check in with the Admitting office.  Call this number if you have problems the morning of surgery:  [phone]   If you have any questions prior to your surgery date call [phone]: Open Monday-Friday 8am-4pm    Remember:  Do not eat or drink after midnight the night before your surgery   Take these medicines the morning of surgery with A SIP OF WATER:  amLODipine (NORVASC)  metoprolol succinate (TOPROL XL)  omeprazole (PRILOSEC) simvastatin (ZOCOR) tamsulosin (FLOMAX)  fluticasone (FLONASE) 50 MCG/ACT nasal spray oxymetazoline (AFRIN) 0.05 % nasal spray traMADol (ULTRAM) if needed acetaminophen (TYLENOL)  if needed  Follow your surgeon's instructions on when to stop Aspirin.  If no instructions were given by your surgeon then you will need to call the office to get those instructions.    As of today, STOP taking any loratadine-pseudoephedrine (CLARITIN-D 24 HOUR), Aleve, Naproxen, Ibuprofen, Motrin, Advil, Goody's, BC's, all herbal medications, fish oil, and all vitamins.  WHAT DO I DO ABOUT MY DIABETES MEDICATION?   Take your normal doses of Novolog Insulin the day before surgery. DO NOT take bedtime dose of Novolog (insulin aspart) Insulin the night before surgery.   If your CBG is greater than 220 mg/dL, you may take  of your sliding scale (Novolog-insulin aspart) (correction) dose of insulin.   HOW TO MANAGE YOUR DIABETES BEFORE AND AFTER SURGERY  Why is it important to control my blood sugar before and after surgery? Improving blood sugar levels before and after surgery helps healing and can limit problems. A way of improving blood sugar control is eating a healthy diet by:  Eating less sugar and carbohydrates  Increasing activity/exercise  Talking with your doctor about reaching your blood sugar goals High blood sugars  (greater than 180 mg/dL) can raise your risk of infections and slow your recovery, so you will need to focus on controlling your diabetes during the weeks before surgery. Make sure that the doctor who takes care of your diabetes knows about your planned surgery including the date and location.  How do I manage my blood sugar before surgery? Check your blood sugar at least 4 times a day, starting 2 days before surgery, to make sure that the level is not too high or low.  Check your blood sugar the morning of your surgery when you wake up and every 2 hours until you get to the Short Stay unit.  If your blood sugar is less than 70 mg/dL, you will need to treat for low blood sugar: Do not take insulin. Treat a low blood sugar (less than 70 mg/dL) with  cup of clear juice (cranberry or apple), 4 glucose tablets, OR glucose gel. Recheck blood sugar in 15 minutes after treatment (to make sure it is greater than 70 mg/dL). If your blood sugar is not greater than 70 mg/dL on recheck, call [phone] for further instructions. Report your blood sugar to the short stay nurse when you get to Short Stay.  If you are admitted to the hospital after surgery: Your blood sugar will be checked by the staff and you will probably be given insulin after surgery (instead of oral diabetes medicines) to make sure you have good blood sugar levels. The goal for blood sugar control after surgery is 80-180 mg/dL.   [facility] is not responsible for any belongings or valuables.  Do NOT Smoke (Tobacco/Vaping)  24 hours prior to your procedure  If you use a CPAP at night, you may bring your mask for your overnight stay.   Contacts, glasses, hearing aids, dentures or partials may not be worn into surgery, please bring cases for these belongings   For patients admitted to the hospital, discharge time will be determined by your treatment team.   Patients discharged the day of surgery will not be allowed to drive  home, and someone needs to stay with them for 24 hours.   SURGICAL WAITING ROOM VISITATION Patients having surgery or a procedure may have no more than 2 support people in the waiting area - these visitors may rotate.   Children under the age of 85 must have an adult with them who is not the patient. If the patient needs to stay at the hospital during part of their recovery, the visitor guidelines for inpatient rooms apply. Pre-op nurse will coordinate an appropriate time for 1 support person to accompany patient in pre-op.  This support person may not rotate.   Please refer to the [facility] website for the visitor guidelines for Inpatients (after your surgery is over and you are in a regular room).    Special instructions:    Oral Hygiene is also important to reduce your risk of infection.  Remember - BRUSH YOUR TEETH THE MORNING OF SURGERY WITH YOUR REGULAR TOOTHPASTE   [place]- Preparing For Surgery  Before surgery, you can play an important role. Because skin is not sterile, your skin needs to be as free of germs as possible. You can reduce the number of germs on your skin by washing with CHG (chlorahexidine gluconate) Soap before surgery.  CHG is an antiseptic cleaner which kills germs and bonds with the skin to continue killing germs even after washing.     Please do not use if you have an allergy to CHG or antibacterial soaps. If your skin becomes reddened/irritated stop using the CHG.  Do not shave (including legs and underarms) for at least 48 hours prior to first CHG shower. It is OK to shave your face.  Please follow these instructions carefully.     Shower the NIGHT BEFORE SURGERY and the MORNING OF SURGERY with CHG Soap.   If you chose to wash your hair, wash your hair first as usual with your normal shampoo. After you shampoo, rinse your hair and body thoroughly to remove the shampoo.  Then [employer] and genitals (private parts) with your normal soap and rinse  thoroughly to remove soap.  After that Use CHG Soap as you would any other liquid soap. You can apply CHG directly to the skin and wash gently with a scrungie or a clean washcloth.   Apply the CHG Soap to your body ONLY FROM THE NECK DOWN.  Do not use on open wounds or open sores. Avoid contact with your eyes, ears, mouth and genitals (private parts). Wash Face and genitals (private parts)  with your normal soap.   Wash thoroughly, paying special attention to the area where your surgery will be performed.  Thoroughly rinse your body with warm water from the neck down.  DO NOT shower/wash with your normal soap after using and rinsing off the CHG Soap.  Pat yourself dry with a CLEAN TOWEL.  Wear CLEAN PAJAMAS to bed the night before surgery  Place CLEAN SHEETS on your bed the night before your surgery  DO NOT SLEEP WITH PETS.  Day of Surgery:  Take a shower with CHG soap. Wear Clean/Comfortable clothing the morning of surgery Do not wear jewelry or makeup. Do not wear lotions, powders, perfumes/cologne or deodorant. Do not shave 48 hours prior to surgery.  Men may shave face and neck. Do not bring valuables to the hospital. Do not wear nail polish, gel polish, artificial nails, or any other type of covering on natural nails (fingers and toes) If you have artificial nails or gel coating that need to be removed by a nail salon, please have this removed prior to surgery. Artificial nails or gel coating may interfere with anesthesia's ability to adequately monitor your vital signs. Remember to brush your teeth WITH YOUR REGULAR TOOTHPASTE.    If you received a COVID test during your pre-op visit, it is requested that you wear a mask when out in public, stay away from anyone that may not be feeling well, and notify your surgeon if you develop symptoms. If you have been in contact with anyone that has tested positive in the last 10 days, please notify your surgeon.    Please read over  the following fact sheets that you were given.

## 2022-08-15 ENCOUNTER — Encounter (HOSPITAL_COMMUNITY): Payer: Self-pay

## 2022-08-15 ENCOUNTER — Encounter (HOSPITAL_COMMUNITY)
Admission: RE | Admit: 2022-08-15 | Discharge: 2022-08-15 | Disposition: A | Discharge status: Home / Self Care | Payer: Medicare Other | Source: Ambulatory Visit | Attending: Thoracic Surgery (Cardiothoracic Vascular Surgery) | Admitting: Thoracic Surgery (Cardiothoracic Vascular Surgery)

## 2022-08-15 ENCOUNTER — Other Ambulatory Visit: Payer: Self-pay

## 2022-08-15 ENCOUNTER — Inpatient Hospital Stay (HOSPITAL_BASED_OUTPATIENT_CLINIC_OR_DEPARTMENT_OTHER)
Admission: RE | Admit: 2022-08-15 | Discharge: 2022-08-15 | Disposition: A | Discharge status: Home / Self Care | Payer: Medicare Other | Source: Ambulatory Visit | Attending: Thoracic Surgery (Cardiothoracic Vascular Surgery) | Admitting: Thoracic Surgery (Cardiothoracic Vascular Surgery)

## 2022-08-15 ENCOUNTER — Ambulatory Visit (HOSPITAL_COMMUNITY)
Admission: RE | Admit: 2022-08-15 | Discharge: 2022-08-15 | Disposition: A | Discharge status: Home / Self Care | Payer: Medicare Other | Source: Ambulatory Visit | Attending: Thoracic Surgery (Cardiothoracic Vascular Surgery) | Admitting: Thoracic Surgery (Cardiothoracic Vascular Surgery)

## 2022-08-15 VITALS — BP 107/60 | HR 67 | Temp 98.0°F | Resp 18

## 2022-08-15 DIAGNOSIS — I21A9 Other myocardial infarction type: Secondary | ICD-10-CM

## 2022-08-15 DIAGNOSIS — I252 Old myocardial infarction: Secondary | ICD-10-CM | POA: Insufficient documentation

## 2022-08-15 DIAGNOSIS — I2510 Atherosclerotic heart disease of native coronary artery without angina pectoris: Secondary | ICD-10-CM | POA: Insufficient documentation

## 2022-08-15 DIAGNOSIS — E119 Type 2 diabetes mellitus without complications: Secondary | ICD-10-CM | POA: Insufficient documentation

## 2022-08-15 DIAGNOSIS — Z20822 Contact with and (suspected) exposure to covid-19: Secondary | ICD-10-CM | POA: Insufficient documentation

## 2022-08-15 DIAGNOSIS — I779 Disorder of arteries and arterioles, unspecified: Secondary | ICD-10-CM | POA: Insufficient documentation

## 2022-08-15 DIAGNOSIS — R079 Chest pain, unspecified: Secondary | ICD-10-CM | POA: Insufficient documentation

## 2022-08-15 DIAGNOSIS — Z01818 Encounter for other preprocedural examination: Secondary | ICD-10-CM

## 2022-08-15 DIAGNOSIS — E785 Hyperlipidemia, unspecified: Secondary | ICD-10-CM | POA: Insufficient documentation

## 2022-08-15 DIAGNOSIS — I5189 Other ill-defined heart diseases: Secondary | ICD-10-CM

## 2022-08-15 DIAGNOSIS — I10 Essential (primary) hypertension: Secondary | ICD-10-CM | POA: Insufficient documentation

## 2022-08-15 HISTORY — DX: Personal history of urinary calculi: Z87.442

## 2022-08-15 HISTORY — DX: Atherosclerotic heart disease of native coronary artery without angina pectoris: I25.10

## 2022-08-15 HISTORY — DX: Dyspnea, unspecified: R06.00

## 2022-08-15 HISTORY — DX: Personal history of other diseases of the digestive system: Z87.19

## 2022-08-15 LAB — COMPREHENSIVE METABOLIC PANEL WITH GFR
ALT: 21 U/L (ref 0–44)
AST: 21 U/L (ref 15–41)
Albumin: 3.8 g/dL (ref 3.5–5.0)
Alkaline Phosphatase: 67 U/L (ref 38–126)
Anion gap: 8 (ref 5–15)
BUN: 22 mg/dL (ref 8–23)
CO2: 22 mmol/L (ref 22–32)
Calcium: 9.4 mg/dL (ref 8.9–10.3)
Chloride: 109 mmol/L (ref 98–111)
Creatinine, Ser: 1.06 mg/dL (ref 0.61–1.24)
GFR, Estimated: >60 mL/min
Glucose, Bld: 68 mg/dL — ABNORMAL LOW (ref 70–99)
Potassium: 3.8 mmol/L (ref 3.5–5.1)
Sodium: 139 mmol/L (ref 135–145)
Total Bilirubin: 0.6 mg/dL (ref 0.3–1.2)
Total Protein: 6.8 g/dL (ref 6.5–8.1)

## 2022-08-15 LAB — CBC
HCT: 45.8 % (ref 39.0–52.0)
Hemoglobin: 14.9 g/dL (ref 13.0–17.0)
MCH: 28.9 pg (ref 26.0–34.0)
MCHC: 32.5 g/dL (ref 30.0–36.0)
MCV: 88.8 fL (ref 80.0–100.0)
Platelets: 242 K/uL (ref 150–400)
RBC: 5.16 MIL/uL (ref 4.22–5.81)
RDW: 13.7 % (ref 11.5–15.5)
WBC: 9.4 K/uL (ref 4.0–10.5)
nRBC: 0 % (ref 0.0–0.2)

## 2022-08-15 LAB — GLUCOSE, CAPILLARY
Glucose-Capillary: 71 mg/dL (ref 70–99)
Glucose-Capillary: 72 mg/dL (ref 70–99)

## 2022-08-15 LAB — URINALYSIS, ROUTINE W REFLEX MICROSCOPIC
Bilirubin Urine: NEGATIVE
Glucose, UA: NEGATIVE mg/dL
Hgb urine dipstick: NEGATIVE
Ketones, ur: NEGATIVE mg/dL
Nitrite: NEGATIVE
Protein, ur: NEGATIVE mg/dL
Specific Gravity, Urine: 1.015 (ref 1.005–1.030)
pH: 6 (ref 5.0–8.0)

## 2022-08-15 LAB — BLOOD GAS, ARTERIAL
Acid-Base Excess: 0.8 mmol/L (ref 0.0–2.0)
Bicarbonate: 25.3 mmol/L (ref 20.0–28.0)
Drawn by: 58793
O2 Saturation: 96.5 %
Patient temperature: 37
pCO2 arterial: 39 mmHg (ref 32–48)
pH, Arterial: 7.42 (ref 7.35–7.45)
pO2, Arterial: 101 mmHg (ref 83–108)

## 2022-08-15 LAB — TYPE AND SCREEN
ABO/RH(D): O NEG
Antibody Screen: NEGATIVE

## 2022-08-15 LAB — PROTIME-INR
INR: 1 (ref 0.8–1.2)
Prothrombin Time: 13 seconds (ref 11.4–15.2)

## 2022-08-15 LAB — APTT: aPTT: 29 s (ref 24–36)

## 2022-08-15 LAB — SURGICAL PCR SCREEN
MRSA, PCR: NEGATIVE
Staphylococcus aureus: POSITIVE — AB

## 2022-08-15 LAB — HEMOGLOBIN A1C
Hgb A1c MFr Bld: 5.9 % — ABNORMAL HIGH (ref 4.8–5.6)
Mean Plasma Glucose: 122.63 mg/dL

## 2022-08-15 NOTE — Progress Notes (Signed)
PCP - Mauricio Po, NP Cardiologist - Dr. Lennie Odor  PPM/ICD - denies   Chest x-ray - 08/15/22 EKG - 08/15/22 Stress Test - 10/24/2010 ECHO - 07/14/22 Cardiac Cath - 07/14/22  Sleep Study - around 10 years ago, OSA- CPAP - denies  DM-Type 2 Fasting Blood Sugar - 130-150 (Pt states that sometimes he gets up in the middle of the night and takes insulin without checking his CBG. Pt advised to hold insulin DOS unless CBG is >220 and then take 1/2 of dose.   Checks Blood Sugar  7 times a day  Blood Thinner Instructions: n/a Aspirin Instructions: hold DOS  ERAS Protcol - no, NPO   COVID TEST- 08/15/22   Anesthesia review: yes, cardiac hx  Patient denies shortness of breath, fever, cough and chest pain at PAT appointment   All instructions explained to the patient, with a verbal understanding of the material. Patient agrees to go over the instructions while at home for a better understanding. Patient also instructed to wear a mask in public after being tested for COVID-19. The opportunity to ask questions was provided.

## 2022-08-15 NOTE — Progress Notes (Signed)
Pre cabg has been completed.   Preliminary results in CV Proc.   Antonio Patterson 08/15/2022 1:33 PM

## 2022-08-15 NOTE — Progress Notes (Signed)
Surgical Instructions    Your procedure is scheduled on Thursday. 08/17/22.Marland Kitchen  Report to [facility] Main Entrance "A" at 5:30 A.M., then check in with the Admitting office.  Call this number if you have problems the morning of surgery:  [phone]   If you have any questions prior to your surgery date call [phone]: Open Monday-Friday 8am-4pm    Remember:  Do not eat after midnight the night before your surgery     Take these medicines the morning of surgery with A SIP OF WATER:  Tylenol if needed Amlodipine METOPROLOL Omeprazole Afrin Nasal Spray Simvastatin Flomax Tramadol if needed Nitroglycerin if needed  As of today, STOP taking any Aspirin (unless otherwise instructed by your surgeon) Aleve, Naproxen, Ibuprofen, Motrin, Advil, Goody's, BC's, all herbal medications, fish oil, and all vitamins.           Do not wear jewelry  Do not wear lotions, powders, cologne or deodorant. Men may shave face and neck. Do not bring valuables to the hospital.  [facility] is not responsible for any belongings or valuables.    Do NOT Smoke (Tobacco/Vaping)  24 hours prior to your procedure  If you use a CPAP at night, you may bring your mask for your overnight stay.   Contacts, glasses, hearing aids, dentures or partials may not be worn into surgery, please bring cases for these belongings   For patients admitted to the hospital, discharge time will be determined by your treatment team.   Patients discharged the day of surgery will not be allowed to drive home, and someone needs to stay with them for 24 hours.   SURGICAL WAITING ROOM VISITATION Patients having surgery or a procedure may have no more than 2 support people in the waiting area - these visitors may rotate.   Children under the age of 25 must have an adult with them who is not the patient. If the patient needs to stay at the hospital during part of their recovery, the visitor guidelines for inpatient rooms  apply. Pre-op nurse will coordinate an appropriate time for 1 support person to accompany patient in pre-op.  This support person may not rotate.   Please refer to the [facility] website for the visitor guidelines for Inpatients (after your surgery is over and you are in a regular room).    Special instructions:    Oral Hygiene is also important to reduce your risk of infection.  Remember - BRUSH YOUR TEETH THE MORNING OF SURGERY WITH YOUR REGULAR TOOTHPASTE   [place]- Preparing For Surgery  Before surgery, you can play an important role. Because skin is not sterile, your skin needs to be as free of germs as possible. You can reduce the number of germs on your skin by washing with CHG (chlorahexidine gluconate) Soap before surgery.  CHG is an antiseptic cleaner which kills germs and bonds with the skin to continue killing germs even after washing.     Please do not use if you have an allergy to CHG or antibacterial soaps. If your skin becomes reddened/irritated stop using the CHG.  Do not shave (including legs and underarms) for at least 48 hours prior to first CHG shower. It is OK to shave your face.  Please follow these instructions carefully.     Shower the NIGHT BEFORE SURGERY and the MORNING OF SURGERY with CHG Soap.   If you chose to wash your hair, wash your hair first as usual with your normal shampoo. After you  shampoo, rinse your hair and body thoroughly to remove the shampoo.  Then [employer] and genitals (private parts) with your normal soap and rinse thoroughly to remove soap.  After that Use CHG Soap as you would any other liquid soap. You can apply CHG directly to the skin and wash gently with a scrungie or a clean washcloth.   Apply the CHG Soap to your body ONLY FROM THE NECK DOWN.  Do not use on open wounds or open sores. Avoid contact with your eyes, ears, mouth and genitals (private parts). Wash Face and genitals (private parts)  with your normal soap.   Wash  thoroughly, paying special attention to the area where your surgery will be performed.  Thoroughly rinse your body with warm water from the neck down.  DO NOT shower/wash with your normal soap after using and rinsing off the CHG Soap.  Pat yourself dry with a CLEAN TOWEL.  Wear CLEAN PAJAMAS to bed the night before surgery  Place CLEAN SHEETS on your bed the night before your surgery  DO NOT SLEEP WITH PETS.   Day of Surgery:  Take a shower with CHG soap. Wear Clean/Comfortable clothing the morning of surgery Do not apply any deodorants/lotions.   Remember to brush your teeth WITH YOUR REGULAR TOOTHPASTE.    If you received a COVID test during your pre-op visit, it is requested that you wear a mask when out in public, stay away from anyone that may not be feeling well, and notify your surgeon if you develop symptoms. If you have been in contact with anyone that has tested positive in the last 10 days, please notify your surgeon.    Please read over the following fact sheets that you were given.

## 2022-08-15 NOTE — Progress Notes (Signed)
Surgical Instructions    Your procedure is scheduled on Thursday. 08/17/22.Marland Kitchen  Report to [facility] Main Entrance "A" at 5:30 A.M., then check in with the Admitting office.  Call this number if you have problems the morning of surgery:  [phone]   If you have any questions prior to your surgery date call [phone]: Open Monday-Friday 8am-4pm    Remember:  Do not eat after midnight the night before your surgery     Take these medicines the morning of surgery with A SIP OF WATER:  Tylenol if needed Amlodipine METOPROLOL Omeprazole Afrin Nasal Spray Simvastatin Flomax Tramadol if needed Nitroglycerin if needed   WHAT DO I DO ABOUT MY DIABETES MEDICATION?    If your CBG is greater than 220 mg/dL, you may take  of your insulin aspart (NOVOLOG FLEXPEN RELION)  dose of insulin.   HOW TO MANAGE YOUR DIABETES BEFORE AND AFTER SURGERY  Why is it important to control my blood sugar before and after surgery? Improving blood sugar levels before and after surgery helps healing and can limit problems. A way of improving blood sugar control is eating a healthy diet by:  Eating less sugar and carbohydrates  Increasing activity/exercise  Talking with your doctor about reaching your blood sugar goals High blood sugars (greater than 180 mg/dL) can raise your risk of infections and slow your recovery, so you will need to focus on controlling your diabetes during the weeks before surgery. Make sure that the doctor who takes care of your diabetes knows about your planned surgery including the date and location.  How do I manage my blood sugar before surgery? Check your blood sugar at least 4 times a day, starting 2 days before surgery, to make sure that the level is not too high or low.  Check your blood sugar the morning of your surgery when you wake up and every 2 hours until you get to the Short Stay unit.  If your blood sugar is less than 70 mg/dL, you will need to treat for low  blood sugar: Do not take insulin. Treat a low blood sugar (less than 70 mg/dL) with  cup of clear juice (cranberry or apple), 4 glucose tablets, OR glucose gel. Recheck blood sugar in 15 minutes after treatment (to make sure it is greater than 70 mg/dL). If your blood sugar is not greater than 70 mg/dL on recheck, call [phone] for further instructions. Report your blood sugar to the short stay nurse when you get to Short Stay.  If you are admitted to the hospital after surgery: Your blood sugar will be checked by the staff and you will probably be given insulin after surgery (instead of oral diabetes medicines) to make sure you have good blood sugar levels. The goal for blood sugar control after surgery is 80-180 mg/dL.   Follow your surgeon's instructions on when to stop Aspirin.  If no instructions were given by your surgeon then you will need to call the office to get those instructions.     As of today, STOP taking any Aleve, Naproxen, Ibuprofen, Motrin, Advil, Goody's, BC's, all herbal medications, fish oil, and all vitamins.           Do not wear jewelry  Do not wear lotions, powders, cologne or deodorant. Men may shave face and neck. Do not bring valuables to the hospital.  [facility] is not responsible for any belongings or valuables.    Do NOT Smoke (Tobacco/Vaping)  24 hours prior to  your procedure  If you use a CPAP at night, you may bring your mask for your overnight stay.   Contacts, glasses, hearing aids, dentures or partials may not be worn into surgery, please bring cases for these belongings   For patients admitted to the hospital, discharge time will be determined by your treatment team.   Patients discharged the day of surgery will not be allowed to drive home, and someone needs to stay with them for 24 hours.   SURGICAL WAITING ROOM VISITATION Patients having surgery or a procedure may have no more than 2 support people in the waiting area - these visitors  may rotate.   Children under the age of 70 must have an adult with them who is not the patient. If the patient needs to stay at the hospital during part of their recovery, the visitor guidelines for inpatient rooms apply. Pre-op nurse will coordinate an appropriate time for 1 support person to accompany patient in pre-op.  This support person may not rotate.   Please refer to the [facility] website for the visitor guidelines for Inpatients (after your surgery is over and you are in a regular room).    Special instructions:    Oral Hygiene is also important to reduce your risk of infection.  Remember - BRUSH YOUR TEETH THE MORNING OF SURGERY WITH YOUR REGULAR TOOTHPASTE   [place]- Preparing For Surgery  Before surgery, you can play an important role. Because skin is not sterile, your skin needs to be as free of germs as possible. You can reduce the number of germs on your skin by washing with CHG (chlorahexidine gluconate) Soap before surgery.  CHG is an antiseptic cleaner which kills germs and bonds with the skin to continue killing germs even after washing.     Please do not use if you have an allergy to CHG or antibacterial soaps. If your skin becomes reddened/irritated stop using the CHG.  Do not shave (including legs and underarms) for at least 48 hours prior to first CHG shower. It is OK to shave your face.  Please follow these instructions carefully.     Shower the NIGHT BEFORE SURGERY and the MORNING OF SURGERY with CHG Soap.   If you chose to wash your hair, wash your hair first as usual with your normal shampoo. After you shampoo, rinse your hair and body thoroughly to remove the shampoo.  Then [employer] and genitals (private parts) with your normal soap and rinse thoroughly to remove soap.  After that Use CHG Soap as you would any other liquid soap. You can apply CHG directly to the skin and wash gently with a scrungie or a clean washcloth.   Apply the CHG Soap to your body  ONLY FROM THE NECK DOWN.  Do not use on open wounds or open sores. Avoid contact with your eyes, ears, mouth and genitals (private parts). Wash Face and genitals (private parts)  with your normal soap.   Wash thoroughly, paying special attention to the area where your surgery will be performed.  Thoroughly rinse your body with warm water from the neck down.  DO NOT shower/wash with your normal soap after using and rinsing off the CHG Soap.  Pat yourself dry with a CLEAN TOWEL.  Wear CLEAN PAJAMAS to bed the night before surgery  Place CLEAN SHEETS on your bed the night before your surgery  DO NOT SLEEP WITH PETS.   Day of Surgery:  Take a shower with CHG  soap. Wear Clean/Comfortable clothing the morning of surgery Do not apply any deodorants/lotions.   Remember to brush your teeth WITH YOUR REGULAR TOOTHPASTE.    If you received a COVID test during your pre-op visit, it is requested that you wear a mask when out in public, stay away from anyone that may not be feeling well, and notify your surgeon if you develop symptoms. If you have been in contact with anyone that has tested positive in the last 10 days, please notify your surgeon.    Please read over the following fact sheets that you were given.

## 2022-08-15 NOTE — Progress Notes (Signed)
Message left for Darius Bump, RN regarding abnormal UA at [employer]

## 2022-08-16 ENCOUNTER — Encounter (HOSPITAL_COMMUNITY): Payer: Self-pay | Admitting: Thoracic Surgery (Cardiothoracic Vascular Surgery)

## 2022-08-16 ENCOUNTER — Other Ambulatory Visit: Payer: Self-pay | Admitting: *Deleted

## 2022-08-16 LAB — SARS CORONAVIRUS 2 (TAT 6-24 HRS): SARS Coronavirus 2: NEGATIVE

## 2022-08-16 MED ORDER — TRANEXAMIC ACID (OHS) PUMP PRIME SOLUTION
2.0000 mg/kg | INTRAVENOUS | Status: DC
  Filled 2022-08-16: qty 1.89

## 2022-08-16 MED ORDER — EPINEPHRINE HCL 5 MG/250ML IV SOLN IN NS
0.0000 ug/min | INTRAVENOUS | Status: DC
  Filled 2022-08-16: qty 250

## 2022-08-16 MED ORDER — DEXMEDETOMIDINE HCL IN NACL 400 MCG/100ML IV SOLN
0.1000 ug/kg/h | INTRAVENOUS | Status: AC
  Administered 2022-08-17: .3 ug/kg/h via INTRAVENOUS
  Filled 2022-08-16: qty 100

## 2022-08-16 MED ORDER — NOREPINEPHRINE 4 MG/250ML-% IV SOLN
0.0000 ug/min | INTRAVENOUS | Status: AC
  Administered 2022-08-17: 2 ug/min via INTRAVENOUS
  Filled 2022-08-16: qty 250

## 2022-08-16 MED ORDER — POTASSIUM CHLORIDE 2 MEQ/ML IV SOLN
80.0000 meq | INTRAVENOUS | Status: DC
  Filled 2022-08-16: qty 40

## 2022-08-16 MED ORDER — MANNITOL 20 % IV SOLN
INTRAVENOUS | Status: DC
  Filled 2022-08-16: qty 13

## 2022-08-16 MED ORDER — PLASMA-LYTE A IV SOLN
INTRAVENOUS | Status: DC
  Filled 2022-08-16: qty 2.5

## 2022-08-16 MED ORDER — INSULIN REGULAR(HUMAN) IN NACL 100-0.9 UT/100ML-% IV SOLN
INTRAVENOUS | Status: AC
  Administered 2022-08-17: 9 [IU]/h via INTRAVENOUS
  Filled 2022-08-16: qty 100

## 2022-08-16 MED ORDER — NITROGLYCERIN IN D5W 200-5 MCG/ML-% IV SOLN
2.0000 ug/min | INTRAVENOUS | Status: DC
  Filled 2022-08-16: qty 250

## 2022-08-16 MED ORDER — CEFAZOLIN SODIUM-DEXTROSE 2-4 GM/100ML-% IV SOLN
2.0000 g | INTRAVENOUS | Status: DC
  Filled 2022-08-16: qty 100

## 2022-08-16 MED ORDER — CEFAZOLIN SODIUM-DEXTROSE 2-4 GM/100ML-% IV SOLN
2.0000 g | INTRAVENOUS | Status: AC
  Administered 2022-08-17 (×2): 2 g via INTRAVENOUS
  Filled 2022-08-16: qty 100

## 2022-08-16 MED ORDER — TRANEXAMIC ACID 1000 MG/10ML IV SOLN
1.5000 mg/kg/h | INTRAVENOUS | Status: AC
  Administered 2022-08-17: 1.5 mg/kg/h via INTRAVENOUS
  Filled 2022-08-16: qty 25

## 2022-08-16 MED ORDER — MILRINONE LACTATE IN DEXTROSE 20-5 MG/100ML-% IV SOLN
0.3000 ug/kg/min | INTRAVENOUS | Status: DC
  Filled 2022-08-16: qty 100

## 2022-08-16 MED ORDER — CIPROFLOXACIN HCL 500 MG PO TABS: 500.0000 mg | ORAL_TABLET | Freq: Two times a day (BID) | ORAL | 0 refills | Status: DC

## 2022-08-16 MED ORDER — VANCOMYCIN HCL 1500 MG/300ML IV SOLN
1500.0000 mg | INTRAVENOUS | Status: AC
  Administered 2022-08-17: 1500 mg via INTRAVENOUS
  Filled 2022-08-16: qty 300

## 2022-08-16 MED ORDER — TRANEXAMIC ACID (OHS) BOLUS VIA INFUSION
15.0000 mg/kg | INTRAVENOUS | Status: AC
  Administered 2022-08-17: 1414.5 mg via INTRAVENOUS
  Filled 2022-08-16: qty 1415

## 2022-08-16 MED ORDER — HEPARIN 30,000 UNITS/1000 ML (OHS) CELLSAVER SOLUTION
Status: DC
  Filled 2022-08-16: qty 1000

## 2022-08-16 MED ORDER — PHENYLEPHRINE HCL-NACL 20-0.9 MG/250ML-% IV SOLN
30.0000 ug/min | INTRAVENOUS | Status: AC
  Administered 2022-08-17: 30 ug/min via INTRAVENOUS
  Filled 2022-08-16: qty 250

## 2022-08-16 NOTE — Progress Notes (Signed)
Antibiotic sent to patient's pharmacy per Dr. Cliffton Asters.

## 2022-08-17 ENCOUNTER — Inpatient Hospital Stay (HOSPITAL_COMMUNITY): Payer: Medicare Other | Admitting: Physician Assistant

## 2022-08-17 ENCOUNTER — Inpatient Hospital Stay (HOSPITAL_COMMUNITY)
Admission: RE | Disposition: A | Discharge status: Home / Self Care | Payer: Self-pay | Source: Home / Self Care | Attending: Thoracic Surgery (Cardiothoracic Vascular Surgery)

## 2022-08-17 ENCOUNTER — Other Ambulatory Visit: Payer: Self-pay

## 2022-08-17 ENCOUNTER — Encounter (HOSPITAL_COMMUNITY): Payer: Self-pay | Admitting: Thoracic Surgery (Cardiothoracic Vascular Surgery)

## 2022-08-17 ENCOUNTER — Inpatient Hospital Stay (HOSPITAL_COMMUNITY)
Admission: RE | Admit: 2022-08-17 | Discharge: 2022-08-21 | DRG: 236 | Disposition: A | Discharge status: Home / Self Care | Payer: Medicare Other | Attending: Thoracic Surgery (Cardiothoracic Vascular Surgery) | Admitting: Thoracic Surgery (Cardiothoracic Vascular Surgery)

## 2022-08-17 ENCOUNTER — Inpatient Hospital Stay (HOSPITAL_COMMUNITY): Payer: Medicare Other | Admitting: Anesthesiology

## 2022-08-17 ENCOUNTER — Inpatient Hospital Stay (HOSPITAL_COMMUNITY): Payer: Medicare Other

## 2022-08-17 DIAGNOSIS — I25119 Atherosclerotic heart disease of native coronary artery with unspecified angina pectoris: Secondary | ICD-10-CM | POA: Diagnosis present

## 2022-08-17 DIAGNOSIS — Z20822 Contact with and (suspected) exposure to covid-19: Secondary | ICD-10-CM | POA: Diagnosis present

## 2022-08-17 DIAGNOSIS — E785 Hyperlipidemia, unspecified: Secondary | ICD-10-CM | POA: Diagnosis present

## 2022-08-17 DIAGNOSIS — I10 Essential (primary) hypertension: Secondary | ICD-10-CM | POA: Diagnosis present

## 2022-08-17 DIAGNOSIS — E876 Hypokalemia: Secondary | ICD-10-CM | POA: Diagnosis not present

## 2022-08-17 DIAGNOSIS — Z79899 Other long term (current) drug therapy: Secondary | ICD-10-CM | POA: Diagnosis not present

## 2022-08-17 DIAGNOSIS — T502X5A Adverse effect of carbonic-anhydrase inhibitors, benzothiadiazides and other diuretics, initial encounter: Secondary | ICD-10-CM | POA: Diagnosis not present

## 2022-08-17 DIAGNOSIS — I2510 Atherosclerotic heart disease of native coronary artery without angina pectoris: Secondary | ICD-10-CM

## 2022-08-17 DIAGNOSIS — Z888 Allergy status to other drugs, medicaments and biological substances status: Secondary | ICD-10-CM | POA: Diagnosis not present

## 2022-08-17 DIAGNOSIS — E1151 Type 2 diabetes mellitus with diabetic peripheral angiopathy without gangrene: Secondary | ICD-10-CM | POA: Diagnosis present

## 2022-08-17 DIAGNOSIS — Z7982 Long term (current) use of aspirin: Secondary | ICD-10-CM

## 2022-08-17 DIAGNOSIS — E11649 Type 2 diabetes mellitus with hypoglycemia without coma: Secondary | ICD-10-CM | POA: Diagnosis not present

## 2022-08-17 DIAGNOSIS — Z882 Allergy status to sulfonamides status: Secondary | ICD-10-CM | POA: Diagnosis not present

## 2022-08-17 DIAGNOSIS — Z87891 Personal history of nicotine dependence: Secondary | ICD-10-CM

## 2022-08-17 DIAGNOSIS — I472 Ventricular tachycardia, unspecified: Secondary | ICD-10-CM | POA: Diagnosis not present

## 2022-08-17 DIAGNOSIS — Z951 Presence of aortocoronary bypass graft: Principal | ICD-10-CM

## 2022-08-17 DIAGNOSIS — N529 Male erectile dysfunction, unspecified: Secondary | ICD-10-CM | POA: Diagnosis present

## 2022-08-17 DIAGNOSIS — Z794 Long term (current) use of insulin: Secondary | ICD-10-CM | POA: Diagnosis not present

## 2022-08-17 DIAGNOSIS — J309 Allergic rhinitis, unspecified: Secondary | ICD-10-CM | POA: Diagnosis present

## 2022-08-17 DIAGNOSIS — I5189 Other ill-defined heart diseases: Secondary | ICD-10-CM

## 2022-08-17 DIAGNOSIS — Z79891 Long term (current) use of opiate analgesic: Secondary | ICD-10-CM | POA: Diagnosis not present

## 2022-08-17 DIAGNOSIS — D62 Acute posthemorrhagic anemia: Secondary | ICD-10-CM | POA: Diagnosis not present

## 2022-08-17 DIAGNOSIS — I358 Other nonrheumatic aortic valve disorders: Secondary | ICD-10-CM | POA: Diagnosis present

## 2022-08-17 DIAGNOSIS — N400 Enlarged prostate without lower urinary tract symptoms: Secondary | ICD-10-CM | POA: Diagnosis present

## 2022-08-17 DIAGNOSIS — Z8249 Family history of ischemic heart disease and other diseases of the circulatory system: Secondary | ICD-10-CM | POA: Diagnosis not present

## 2022-08-17 DIAGNOSIS — J90 Pleural effusion, not elsewhere classified: Secondary | ICD-10-CM | POA: Diagnosis not present

## 2022-08-17 HISTORY — PX: TEE WITHOUT CARDIOVERSION: SHX5443

## 2022-08-17 HISTORY — PX: CORONARY ARTERY BYPASS GRAFT: SHX141

## 2022-08-17 LAB — POCT I-STAT, CHEM 8
BUN: 21 mg/dL (ref 8–23)
BUN: 20 mg/dL (ref 8–23)
BUN: 20 mg/dL (ref 8–23)
BUN: 19 mg/dL (ref 8–23)
BUN: 19 mg/dL (ref 8–23)
BUN: 19 mg/dL (ref 8–23)
Calcium, Ion: 1.29 mmol/L (ref 1.15–1.40)
Calcium, Ion: 1.22 mmol/L (ref 1.15–1.40)
Calcium, Ion: 1.24 mmol/L (ref 1.15–1.40)
Calcium, Ion: 1.1 mmol/L — ABNORMAL LOW (ref 1.15–1.40)
Calcium, Ion: >2.5 mmol/L (ref 1.15–1.40)
Calcium, Ion: 1.36 mmol/L (ref 1.15–1.40)
Chloride: 102 mmol/L (ref 98–111)
Chloride: 103 mmol/L (ref 98–111)
Chloride: 103 mmol/L (ref 98–111)
Chloride: 104 mmol/L (ref 98–111)
Chloride: 111 mmol/L (ref 98–111)
Chloride: 106 mmol/L (ref 98–111)
Creatinine, Ser: 0.9 mg/dL (ref 0.61–1.24)
Creatinine, Ser: 0.8 mg/dL (ref 0.61–1.24)
Creatinine, Ser: 0.8 mg/dL (ref 0.61–1.24)
Creatinine, Ser: 0.7 mg/dL (ref 0.61–1.24)
Creatinine, Ser: 0.7 mg/dL (ref 0.61–1.24)
Creatinine, Ser: 0.6 mg/dL — ABNORMAL LOW (ref 0.61–1.24)
Glucose, Bld: 208 mg/dL — ABNORMAL HIGH (ref 70–99)
Glucose, Bld: 185 mg/dL — ABNORMAL HIGH (ref 70–99)
Glucose, Bld: 148 mg/dL — ABNORMAL HIGH (ref 70–99)
Glucose, Bld: 138 mg/dL — ABNORMAL HIGH (ref 70–99)
Glucose, Bld: 171 mg/dL — ABNORMAL HIGH (ref 70–99)
Glucose, Bld: 165 mg/dL — ABNORMAL HIGH (ref 70–99)
HCT: 41 % (ref 39.0–52.0)
HCT: 38 % — ABNORMAL LOW (ref 39.0–52.0)
HCT: 36 % — ABNORMAL LOW (ref 39.0–52.0)
HCT: 27 % — ABNORMAL LOW (ref 39.0–52.0)
HCT: 29 % — ABNORMAL LOW (ref 39.0–52.0)
HCT: 30 % — ABNORMAL LOW (ref 39.0–52.0)
Hemoglobin: 13.9 g/dL (ref 13.0–17.0)
Hemoglobin: 12.9 g/dL — ABNORMAL LOW (ref 13.0–17.0)
Hemoglobin: 12.2 g/dL — ABNORMAL LOW (ref 13.0–17.0)
Hemoglobin: 9.2 g/dL — ABNORMAL LOW (ref 13.0–17.0)
Hemoglobin: 9.9 g/dL — ABNORMAL LOW (ref 13.0–17.0)
Hemoglobin: 10.2 g/dL — ABNORMAL LOW (ref 13.0–17.0)
Potassium: 4.2 mmol/L (ref 3.5–5.1)
Potassium: 3.8 mmol/L (ref 3.5–5.1)
Potassium: 3.8 mmol/L (ref 3.5–5.1)
Potassium: 4.4 mmol/L (ref 3.5–5.1)
Potassium: 4.6 mmol/L (ref 3.5–5.1)
Potassium: 4.2 mmol/L (ref 3.5–5.1)
Sodium: 139 mmol/L (ref 135–145)
Sodium: 139 mmol/L (ref 135–145)
Sodium: 140 mmol/L (ref 135–145)
Sodium: 135 mmol/L (ref 135–145)
Sodium: 139 mmol/L (ref 135–145)
Sodium: 139 mmol/L (ref 135–145)
TCO2: 25 mmol/L (ref 22–32)
TCO2: 23 mmol/L (ref 22–32)
TCO2: 23 mmol/L (ref 22–32)
TCO2: 22 mmol/L (ref 22–32)
TCO2: 22 mmol/L (ref 22–32)
TCO2: 24 mmol/L (ref 22–32)

## 2022-08-17 LAB — POCT I-STAT 7, (LYTES, BLD GAS, ICA,H+H)
Acid-Base Excess: 0 mmol/L (ref 0.0–2.0)
Acid-Base Excess: 0 mmol/L (ref 0.0–2.0)
Acid-base deficit: 1 mmol/L (ref 0.0–2.0)
Acid-base deficit: 2 mmol/L (ref 0.0–2.0)
Acid-base deficit: 2 mmol/L (ref 0.0–2.0)
Acid-base deficit: 6 mmol/L — ABNORMAL HIGH (ref 0.0–2.0)
Acid-base deficit: 7 mmol/L — ABNORMAL HIGH (ref 0.0–2.0)
Bicarbonate: 24.8 mmol/L (ref 20.0–28.0)
Bicarbonate: 22.3 mmol/L (ref 20.0–28.0)
Bicarbonate: 23.6 mmol/L (ref 20.0–28.0)
Bicarbonate: 24.6 mmol/L (ref 20.0–28.0)
Bicarbonate: 23.6 mmol/L (ref 20.0–28.0)
Bicarbonate: 19.8 mmol/L — ABNORMAL LOW (ref 20.0–28.0)
Bicarbonate: 18.9 mmol/L — ABNORMAL LOW (ref 20.0–28.0)
Calcium, Ion: 0.98 mmol/L — ABNORMAL LOW (ref 1.15–1.40)
Calcium, Ion: 1.1 mmol/L — ABNORMAL LOW (ref 1.15–1.40)
Calcium, Ion: 1.11 mmol/L — ABNORMAL LOW (ref 1.15–1.40)
Calcium, Ion: 1.11 mmol/L — ABNORMAL LOW (ref 1.15–1.40)
Calcium, Ion: 1.4 mmol/L (ref 1.15–1.40)
Calcium, Ion: 1.25 mmol/L (ref 1.15–1.40)
Calcium, Ion: 1.14 mmol/L — ABNORMAL LOW (ref 1.15–1.40)
HCT: 28 % — ABNORMAL LOW (ref 39.0–52.0)
HCT: 28 % — ABNORMAL LOW (ref 39.0–52.0)
HCT: 28 % — ABNORMAL LOW (ref 39.0–52.0)
HCT: 29 % — ABNORMAL LOW (ref 39.0–52.0)
HCT: 30 % — ABNORMAL LOW (ref 39.0–52.0)
HCT: 30 % — ABNORMAL LOW (ref 39.0–52.0)
HCT: 29 % — ABNORMAL LOW (ref 39.0–52.0)
Hemoglobin: 9.5 g/dL — ABNORMAL LOW (ref 13.0–17.0)
Hemoglobin: 9.5 g/dL — ABNORMAL LOW (ref 13.0–17.0)
Hemoglobin: 9.5 g/dL — ABNORMAL LOW (ref 13.0–17.0)
Hemoglobin: 9.9 g/dL — ABNORMAL LOW (ref 13.0–17.0)
Hemoglobin: 10.2 g/dL — ABNORMAL LOW (ref 13.0–17.0)
Hemoglobin: 10.2 g/dL — ABNORMAL LOW (ref 13.0–17.0)
Hemoglobin: 9.9 g/dL — ABNORMAL LOW (ref 13.0–17.0)
O2 Saturation: 100 %
O2 Saturation: 100 %
O2 Saturation: 100 %
O2 Saturation: 100 %
O2 Saturation: 100 %
O2 Saturation: 94 %
O2 Saturation: 97 %
Patient temperature: 98.3
Patient temperature: 36.7
Potassium: 4.2 mmol/L (ref 3.5–5.1)
Potassium: 4.4 mmol/L (ref 3.5–5.1)
Potassium: 4.4 mmol/L (ref 3.5–5.1)
Potassium: 5 mmol/L (ref 3.5–5.1)
Potassium: 4.2 mmol/L (ref 3.5–5.1)
Potassium: 3.8 mmol/L (ref 3.5–5.1)
Potassium: 3.7 mmol/L (ref 3.5–5.1)
Sodium: 137 mmol/L (ref 135–145)
Sodium: 137 mmol/L (ref 135–145)
Sodium: 137 mmol/L (ref 135–145)
Sodium: 138 mmol/L (ref 135–145)
Sodium: 139 mmol/L (ref 135–145)
Sodium: 140 mmol/L (ref 135–145)
Sodium: 141 mmol/L (ref 135–145)
TCO2: 26 mmol/L (ref 22–32)
TCO2: 23 mmol/L (ref 22–32)
TCO2: 25 mmol/L (ref 22–32)
TCO2: 26 mmol/L (ref 22–32)
TCO2: 25 mmol/L (ref 22–32)
TCO2: 21 mmol/L — ABNORMAL LOW (ref 22–32)
TCO2: 20 mmol/L — ABNORMAL LOW (ref 22–32)
pCO2 arterial: 44.6 mmHg (ref 32–48)
pCO2 arterial: 35.3 mmHg (ref 32–48)
pCO2 arterial: 36.2 mmHg (ref 32–48)
pCO2 arterial: 40 mmHg (ref 32–48)
pCO2 arterial: 42 mmHg (ref 32–48)
pCO2 arterial: 40.2 mmHg (ref 32–48)
pCO2 arterial: 38 mmHg (ref 32–48)
pH, Arterial: 7.353 (ref 7.35–7.45)
pH, Arterial: 7.396 (ref 7.35–7.45)
pH, Arterial: 7.398 (ref 7.35–7.45)
pH, Arterial: 7.434 (ref 7.35–7.45)
pH, Arterial: 7.357 (ref 7.35–7.45)
pH, Arterial: 7.3 — ABNORMAL LOW (ref 7.35–7.45)
pH, Arterial: 7.303 — ABNORMAL LOW (ref 7.35–7.45)
pO2, Arterial: 422 mmHg — ABNORMAL HIGH (ref 83–108)
pO2, Arterial: 184 mmHg — ABNORMAL HIGH (ref 83–108)
pO2, Arterial: 329 mmHg — ABNORMAL HIGH (ref 83–108)
pO2, Arterial: 347 mmHg — ABNORMAL HIGH (ref 83–108)
pO2, Arterial: 482 mmHg — ABNORMAL HIGH (ref 83–108)
pO2, Arterial: 76 mmHg — ABNORMAL LOW (ref 83–108)
pO2, Arterial: 95 mmHg (ref 83–108)

## 2022-08-17 LAB — ABO/RH: ABO/RH(D): O NEG

## 2022-08-17 LAB — POCT I-STAT EG7
Acid-base deficit: 1 mmol/L (ref 0.0–2.0)
Bicarbonate: 24.7 mmol/L (ref 20.0–28.0)
Calcium, Ion: 1.08 mmol/L — ABNORMAL LOW (ref 1.15–1.40)
HCT: 29 % — ABNORMAL LOW (ref 39.0–52.0)
Hemoglobin: 9.9 g/dL — ABNORMAL LOW (ref 13.0–17.0)
O2 Saturation: 85 %
Potassium: 3.9 mmol/L (ref 3.5–5.1)
Sodium: 141 mmol/L (ref 135–145)
TCO2: 26 mmol/L (ref 22–32)
pCO2, Ven: 46 mmHg (ref 44–60)
pH, Ven: 7.337 (ref 7.25–7.43)
pO2, Ven: 54 mmHg — ABNORMAL HIGH (ref 32–45)

## 2022-08-17 LAB — HEMOGLOBIN AND HEMATOCRIT, BLOOD
HCT: 29.4 % — ABNORMAL LOW (ref 39.0–52.0)
Hemoglobin: 9.8 g/dL — ABNORMAL LOW (ref 13.0–17.0)

## 2022-08-17 LAB — GLUCOSE, CAPILLARY
Glucose-Capillary: 195 mg/dL — ABNORMAL HIGH (ref 70–99)
Glucose-Capillary: 134 mg/dL — ABNORMAL HIGH (ref 70–99)
Glucose-Capillary: 112 mg/dL — ABNORMAL HIGH (ref 70–99)
Glucose-Capillary: 90 mg/dL (ref 70–99)
Glucose-Capillary: 129 mg/dL — ABNORMAL HIGH (ref 70–99)
Glucose-Capillary: 126 mg/dL — ABNORMAL HIGH (ref 70–99)
Glucose-Capillary: 140 mg/dL — ABNORMAL HIGH (ref 70–99)
Glucose-Capillary: 149 mg/dL — ABNORMAL HIGH (ref 70–99)
Glucose-Capillary: 144 mg/dL — ABNORMAL HIGH (ref 70–99)
Glucose-Capillary: 139 mg/dL — ABNORMAL HIGH (ref 70–99)

## 2022-08-17 LAB — CBC
HCT: 32.7 % — ABNORMAL LOW (ref 39.0–52.0)
Hemoglobin: 10.9 g/dL — ABNORMAL LOW (ref 13.0–17.0)
MCH: 29.2 pg (ref 26.0–34.0)
MCHC: 33.3 g/dL (ref 30.0–36.0)
MCV: 87.7 fL (ref 80.0–100.0)
Platelets: 119 10*3/uL — ABNORMAL LOW (ref 150–400)
RBC: 3.73 MIL/uL — ABNORMAL LOW (ref 4.22–5.81)
RDW: 13.3 % (ref 11.5–15.5)
WBC: 17.5 10*3/uL — ABNORMAL HIGH (ref 4.0–10.5)
nRBC: 0 % (ref 0.0–0.2)

## 2022-08-17 LAB — APTT: aPTT: 32 seconds (ref 24–36)

## 2022-08-17 LAB — PROTIME-INR
INR: 1.6 — ABNORMAL HIGH (ref 0.8–1.2)
Prothrombin Time: 18.9 seconds — ABNORMAL HIGH (ref 11.4–15.2)

## 2022-08-17 LAB — PLATELET COUNT: Platelets: 130 10*3/uL — ABNORMAL LOW (ref 150–400)

## 2022-08-17 SURGERY — CORONARY ARTERY BYPASS GRAFTING (CABG)
Anesthesia: General | Site: Esophagus

## 2022-08-17 MED ORDER — ROCURONIUM BROMIDE 10 MG/ML (PF) SYRINGE
PREFILLED_SYRINGE | INTRAVENOUS | Status: AC
  Filled 2022-08-17: qty 10

## 2022-08-17 MED ORDER — MIDAZOLAM HCL (PF) 10 MG/2ML IJ SOLN
INTRAMUSCULAR | Status: AC
  Filled 2022-08-17: qty 2

## 2022-08-17 MED ORDER — ~~LOC~~ CARDIAC SURGERY, PATIENT & FAMILY EDUCATION
Freq: Once | Status: DC
  Filled 2022-08-17: qty 1

## 2022-08-17 MED ORDER — ACETAMINOPHEN 650 MG RE SUPP
650.0000 mg | Freq: Once | RECTAL | Status: AC
  Administered 2022-08-17: 650 mg via RECTAL

## 2022-08-17 MED ORDER — LACTATED RINGERS IV SOLN: 500.0000 mL | Freq: Once | INTRAVENOUS | Status: DC | PRN

## 2022-08-17 MED ORDER — LACTATED RINGERS IV SOLN: INTRAVENOUS | Status: DC | PRN

## 2022-08-17 MED ORDER — FENTANYL CITRATE (PF) 250 MCG/5ML IJ SOLN
INTRAMUSCULAR | Status: AC
  Filled 2022-08-17: qty 5

## 2022-08-17 MED ORDER — SODIUM CHLORIDE 0.9% FLUSH: 3.0000 mL | INTRAVENOUS | Status: DC | PRN

## 2022-08-17 MED ORDER — CEFAZOLIN SODIUM-DEXTROSE 2-4 GM/100ML-% IV SOLN
2.0000 g | Freq: Three times a day (TID) | INTRAVENOUS | Status: AC
  Administered 2022-08-17 – 2022-08-19 (×6): 2 g via INTRAVENOUS
  Filled 2022-08-17 (×6): qty 100

## 2022-08-17 MED ORDER — HEPARIN SODIUM (PORCINE) 1000 UNIT/ML IJ SOLN
INTRAMUSCULAR | Status: DC | PRN
  Administered 2022-08-17: 30000 [IU] via INTRAVENOUS

## 2022-08-17 MED ORDER — ALBUMIN HUMAN 5 % IV SOLN
250.0000 mL | INTRAVENOUS | Status: AC | PRN
  Administered 2022-08-17 (×2): 12.5 g via INTRAVENOUS

## 2022-08-17 MED ORDER — VASOPRESSIN 20 UNIT/ML IV SOLN
INTRAVENOUS | Status: AC
  Filled 2022-08-17: qty 1

## 2022-08-17 MED ORDER — SODIUM CHLORIDE 0.9 % IV SOLN: INTRAVENOUS | Status: DC | PRN

## 2022-08-17 MED ORDER — DOCUSATE SODIUM 100 MG PO CAPS
200.0000 mg | ORAL_CAPSULE | Freq: Every day | ORAL | Status: DC
  Administered 2022-08-18 – 2022-08-21 (×4): 200 mg via ORAL
  Filled 2022-08-17 (×4): qty 2

## 2022-08-17 MED ORDER — TRAMADOL HCL 50 MG PO TABS
50.0000 mg | ORAL_TABLET | ORAL | Status: DC | PRN
  Administered 2022-08-18 – 2022-08-19 (×2): 100 mg via ORAL
  Filled 2022-08-17 (×2): qty 2

## 2022-08-17 MED ORDER — METOPROLOL TARTRATE 12.5 MG HALF TABLET: 12.5000 mg | ORAL_TABLET | Freq: Once | ORAL | Status: DC

## 2022-08-17 MED ORDER — VANCOMYCIN HCL IN DEXTROSE 1-5 GM/200ML-% IV SOLN
1000.0000 mg | Freq: Once | INTRAVENOUS | Status: AC
  Administered 2022-08-17: 1000 mg via INTRAVENOUS
  Filled 2022-08-17: qty 200

## 2022-08-17 MED ORDER — DEXMEDETOMIDINE HCL IN NACL 400 MCG/100ML IV SOLN: 0.0000 ug/kg/h | INTRAVENOUS | Status: DC

## 2022-08-17 MED ORDER — PROTAMINE SULFATE 10 MG/ML IV SOLN
INTRAVENOUS | Status: AC
  Filled 2022-08-17: qty 25

## 2022-08-17 MED ORDER — PHENYLEPHRINE HCL-NACL 20-0.9 MG/250ML-% IV SOLN: 0.0000 ug/min | INTRAVENOUS | Status: DC

## 2022-08-17 MED ORDER — ALBUMIN HUMAN 5 % IV SOLN
INTRAVENOUS | Status: AC
  Filled 2022-08-17: qty 500

## 2022-08-17 MED ORDER — MIDAZOLAM HCL (PF) 5 MG/ML IJ SOLN
INTRAMUSCULAR | Status: DC | PRN
  Administered 2022-08-17: 1 mg via INTRAVENOUS
  Administered 2022-08-17: 3 mg via INTRAVENOUS
  Administered 2022-08-17 (×3): 1 mg via INTRAVENOUS

## 2022-08-17 MED ORDER — VASOPRESSIN 20 UNITS/100 ML INFUSION FOR SHOCK
INTRAVENOUS | Status: DC | PRN
  Administered 2022-08-17: .04 [IU]/min via INTRAVENOUS

## 2022-08-17 MED ORDER — FENTANYL CITRATE (PF) 250 MCG/5ML IJ SOLN
INTRAMUSCULAR | Status: DC | PRN
  Administered 2022-08-17: 50 ug via INTRAVENOUS
  Administered 2022-08-17: 100 ug via INTRAVENOUS
  Administered 2022-08-17 (×2): 50 ug via INTRAVENOUS
  Administered 2022-08-17: 300 ug via INTRAVENOUS
  Administered 2022-08-17: 50 ug via INTRAVENOUS
  Administered 2022-08-17: 100 ug via INTRAVENOUS

## 2022-08-17 MED ORDER — SODIUM CHLORIDE 0.9 % IR SOLN
Status: DC | PRN
  Administered 2022-08-17: 6000

## 2022-08-17 MED ORDER — HEPARIN SODIUM (PORCINE) 1000 UNIT/ML IJ SOLN
INTRAMUSCULAR | Status: AC
Start: 2022-08-17 — End: ?
  Filled 2022-08-17: qty 1

## 2022-08-17 MED ORDER — PROTAMINE SULFATE 10 MG/ML IV SOLN
INTRAVENOUS | Status: DC | PRN
  Administered 2022-08-17: 300 mg via INTRAVENOUS

## 2022-08-17 MED ORDER — SODIUM CHLORIDE (PF) 0.9 % IJ SOLN
INTRAMUSCULAR | Status: AC
Start: 2022-08-17 — End: ?
  Filled 2022-08-17: qty 20

## 2022-08-17 MED ORDER — SODIUM CHLORIDE (PF) 0.9 % IJ SOLN: OROMUCOSAL | Status: DC | PRN

## 2022-08-17 MED ORDER — TAMSULOSIN HCL 0.4 MG PO CAPS
0.4000 mg | ORAL_CAPSULE | Freq: Every day | ORAL | Status: DC
  Administered 2022-08-18 – 2022-08-21 (×4): 0.4 mg via ORAL
  Filled 2022-08-17 (×4): qty 1

## 2022-08-17 MED ORDER — CHLORHEXIDINE GLUCONATE 4 % EX LIQD: 30.0000 mL | CUTANEOUS | Status: DC

## 2022-08-17 MED ORDER — ASPIRIN 325 MG PO TBEC
325.0000 mg | DELAYED_RELEASE_TABLET | Freq: Every day | ORAL | Status: DC
  Administered 2022-08-18 – 2022-08-21 (×4): 325 mg via ORAL
  Filled 2022-08-17 (×4): qty 1

## 2022-08-17 MED ORDER — METOPROLOL TARTRATE 5 MG/5ML IV SOLN: 2.5000 mg | INTRAVENOUS | Status: DC | PRN

## 2022-08-17 MED ORDER — NOREPINEPHRINE 4 MG/250ML-% IV SOLN: 0.0000 ug/min | INTRAVENOUS | Status: DC

## 2022-08-17 MED ORDER — PHENYLEPHRINE 80 MCG/ML (10ML) SYRINGE FOR IV PUSH (FOR BLOOD PRESSURE SUPPORT)
PREFILLED_SYRINGE | INTRAVENOUS | Status: AC
  Filled 2022-08-17: qty 10

## 2022-08-17 MED ORDER — NICARDIPINE HCL IN NACL 20-0.86 MG/200ML-% IV SOLN: 3.0000 mg/h | INTRAVENOUS | Status: DC

## 2022-08-17 MED ORDER — HEMOSTATIC AGENTS (NO CHARGE) OPTIME
TOPICAL | Status: DC | PRN
  Administered 2022-08-17: 1 via TOPICAL

## 2022-08-17 MED ORDER — PROPOFOL 10 MG/ML IV BOLUS
INTRAVENOUS | Status: AC
  Filled 2022-08-17: qty 20

## 2022-08-17 MED ORDER — DEXTROSE 50 % IV SOLN: 0.0000 mL | INTRAVENOUS | Status: DC | PRN

## 2022-08-17 MED ORDER — ONDANSETRON HCL 4 MG/2ML IJ SOLN: 4.0000 mg | Freq: Four times a day (QID) | INTRAMUSCULAR | Status: DC | PRN

## 2022-08-17 MED ORDER — SODIUM CHLORIDE 0.9 % IR SOLN: Status: DC | PRN

## 2022-08-17 MED ORDER — OXYCODONE HCL 5 MG PO TABS
5.0000 mg | ORAL_TABLET | ORAL | Status: DC | PRN
  Administered 2022-08-18 – 2022-08-19 (×4): 10 mg via ORAL
  Filled 2022-08-17 (×4): qty 2

## 2022-08-17 MED ORDER — SODIUM CHLORIDE 0.9 % IV SOLN: 250.0000 mL | INTRAVENOUS | Status: DC

## 2022-08-17 MED ORDER — BISACODYL 5 MG PO TBEC
10.0000 mg | DELAYED_RELEASE_TABLET | Freq: Every day | ORAL | Status: DC
  Administered 2022-08-18 – 2022-08-21 (×4): 10 mg via ORAL
  Filled 2022-08-17 (×4): qty 2

## 2022-08-17 MED ORDER — ACETAMINOPHEN 500 MG PO TABS
1000.0000 mg | ORAL_TABLET | Freq: Four times a day (QID) | ORAL | Status: DC
  Administered 2022-08-18 – 2022-08-21 (×13): 1000 mg via ORAL
  Filled 2022-08-17 (×13): qty 2

## 2022-08-17 MED ORDER — LACTATED RINGERS IV SOLN: INTRAVENOUS | Status: DC

## 2022-08-17 MED ORDER — CHLORHEXIDINE GLUCONATE CLOTH 2 % EX PADS
6.0000 | MEDICATED_PAD | Freq: Every day | CUTANEOUS | Status: DC
  Administered 2022-08-18 – 2022-08-19 (×2): 6 via TOPICAL

## 2022-08-17 MED ORDER — BISACODYL 10 MG RE SUPP: 10.0000 mg | Freq: Every day | RECTAL | Status: DC

## 2022-08-17 MED ORDER — PROPOFOL 10 MG/ML IV BOLUS
INTRAVENOUS | Status: DC | PRN
  Administered 2022-08-17: 80 mg via INTRAVENOUS

## 2022-08-17 MED ORDER — EPHEDRINE SULFATE-NACL 50-0.9 MG/10ML-% IV SOSY
PREFILLED_SYRINGE | INTRAVENOUS | Status: DC | PRN
  Administered 2022-08-17: 10 mg via INTRAVENOUS
  Administered 2022-08-17 (×2): 5 mg via INTRAVENOUS

## 2022-08-17 MED ORDER — CHLORHEXIDINE GLUCONATE 0.12 % MT SOLN
15.0000 mL | Freq: Once | OROMUCOSAL | Status: AC
  Administered 2022-08-17: 15 mL via OROMUCOSAL
  Filled 2022-08-17: qty 15

## 2022-08-17 MED ORDER — ASPIRIN 81 MG PO CHEW: 324.0000 mg | CHEWABLE_TABLET | Freq: Every day | ORAL | Status: DC

## 2022-08-17 MED ORDER — ACETAMINOPHEN 160 MG/5ML PO SOLN: 650.0000 mg | Freq: Once | ORAL | Status: AC

## 2022-08-17 MED ORDER — METOPROLOL TARTRATE 12.5 MG HALF TABLET
12.5000 mg | ORAL_TABLET | Freq: Two times a day (BID) | ORAL | Status: DC
  Administered 2022-08-18 – 2022-08-21 (×7): 12.5 mg via ORAL
  Filled 2022-08-17 (×7): qty 1

## 2022-08-17 MED ORDER — MORPHINE SULFATE (PF) 2 MG/ML IV SOLN
1.0000 mg | INTRAVENOUS | Status: DC | PRN
  Administered 2022-08-17: 2 mg via INTRAVENOUS
  Administered 2022-08-17 (×2): 3 mg via INTRAVENOUS
  Administered 2022-08-18: 2 mg via INTRAVENOUS
  Filled 2022-08-17 (×2): qty 1
  Filled 2022-08-17: qty 2
  Filled 2022-08-17: qty 1
  Filled 2022-08-17: qty 2

## 2022-08-17 MED ORDER — ROCURONIUM BROMIDE 10 MG/ML (PF) SYRINGE
PREFILLED_SYRINGE | INTRAVENOUS | Status: DC | PRN
  Administered 2022-08-17: 30 mg via INTRAVENOUS
  Administered 2022-08-17 (×2): 50 mg via INTRAVENOUS
  Administered 2022-08-17: 60 mg via INTRAVENOUS
  Administered 2022-08-17: 70 mg via INTRAVENOUS

## 2022-08-17 MED ORDER — NITROGLYCERIN IN D5W 200-5 MCG/ML-% IV SOLN
0.0000 ug/min | INTRAVENOUS | Status: DC
  Filled 2022-08-17: qty 250

## 2022-08-17 MED ORDER — PHENYLEPHRINE 80 MCG/ML (10ML) SYRINGE FOR IV PUSH (FOR BLOOD PRESSURE SUPPORT)
PREFILLED_SYRINGE | INTRAVENOUS | Status: DC | PRN
  Administered 2022-08-17 (×2): 80 ug via INTRAVENOUS
  Administered 2022-08-17: 160 ug via INTRAVENOUS
  Administered 2022-08-17: 80 ug via INTRAVENOUS
  Administered 2022-08-17: 160 ug via INTRAVENOUS
  Administered 2022-08-17: 80 ug via INTRAVENOUS

## 2022-08-17 MED ORDER — METOPROLOL TARTRATE 25 MG/10 ML ORAL SUSPENSION: 12.5000 mg | Freq: Two times a day (BID) | ORAL | Status: DC

## 2022-08-17 MED ORDER — ACETAMINOPHEN 160 MG/5ML PO SOLN: 1000.0000 mg | Freq: Four times a day (QID) | ORAL | Status: DC

## 2022-08-17 MED ORDER — EPHEDRINE 5 MG/ML INJ
INTRAVENOUS | Status: AC
  Filled 2022-08-17: qty 5

## 2022-08-17 MED ORDER — MAGNESIUM SULFATE 4 GM/100ML IV SOLN
4.0000 g | Freq: Once | INTRAVENOUS | Status: AC
  Administered 2022-08-17: 4 g via INTRAVENOUS
  Filled 2022-08-17: qty 100

## 2022-08-17 MED ORDER — INSULIN REGULAR(HUMAN) IN NACL 100-0.9 UT/100ML-% IV SOLN: INTRAVENOUS | Status: DC

## 2022-08-17 MED ORDER — CHLORHEXIDINE GLUCONATE 0.12 % MT SOLN: 15.0000 mL | OROMUCOSAL | Status: DC

## 2022-08-17 MED ORDER — SODIUM CHLORIDE 0.9 % IV SOLN: INTRAVENOUS | Status: DC

## 2022-08-17 MED ORDER — PANTOPRAZOLE SODIUM 40 MG PO TBEC
40.0000 mg | DELAYED_RELEASE_TABLET | Freq: Every day | ORAL | Status: DC
  Administered 2022-08-19 – 2022-08-21 (×3): 40 mg via ORAL
  Filled 2022-08-17 (×3): qty 1

## 2022-08-17 MED ORDER — PLASMA-LYTE A IV SOLN: INTRAVENOUS | Status: DC | PRN

## 2022-08-17 MED ORDER — SODIUM CHLORIDE 0.9% FLUSH
3.0000 mL | Freq: Two times a day (BID) | INTRAVENOUS | Status: DC
  Administered 2022-08-18 – 2022-08-21 (×7): 3 mL via INTRAVENOUS

## 2022-08-17 MED ORDER — POTASSIUM CHLORIDE 10 MEQ/50ML IV SOLN
10.0000 meq | INTRAVENOUS | Status: AC
  Administered 2022-08-17 (×3): 10 meq via INTRAVENOUS

## 2022-08-17 MED ORDER — SIMVASTATIN 20 MG PO TABS
40.0000 mg | ORAL_TABLET | Freq: Every day | ORAL | Status: DC
  Administered 2022-08-18 – 2022-08-21 (×4): 40 mg via ORAL
  Filled 2022-08-17 (×4): qty 2

## 2022-08-17 MED ORDER — FAMOTIDINE IN NACL 20-0.9 MG/50ML-% IV SOLN
20.0000 mg | Freq: Two times a day (BID) | INTRAVENOUS | Status: AC
  Administered 2022-08-17 (×2): 20 mg via INTRAVENOUS
  Filled 2022-08-17 (×2): qty 50

## 2022-08-17 MED ORDER — VASOPRESSIN 20 UNIT/ML IV SOLN
INTRAVENOUS | Status: DC | PRN
  Administered 2022-08-17: 2 [IU] via INTRAVENOUS
  Administered 2022-08-17: 1 [IU] via INTRAVENOUS
  Administered 2022-08-17: 2 [IU] via INTRAVENOUS

## 2022-08-17 MED ORDER — MIDAZOLAM HCL 2 MG/2ML IJ SOLN: 2.0000 mg | INTRAMUSCULAR | Status: DC | PRN

## 2022-08-17 MED ORDER — ALBUMIN HUMAN 5 % IV SOLN: INTRAVENOUS | Status: DC | PRN

## 2022-08-17 MED ORDER — SODIUM CHLORIDE 0.45 % IV SOLN: INTRAVENOUS | Status: DC | PRN

## 2022-08-17 SURGICAL SUPPLY — 87 items
BAG DECANTER FOR FLEXI CONT (MISCELLANEOUS) ×2 IMPLANT
BLADE CLIPPER SURG (BLADE) ×2 IMPLANT
BLADE STERNUM SYSTEM 6 (BLADE) ×2 IMPLANT
BNDG ELASTIC 4X5.8 VLCR STR LF (GAUZE/BANDAGES/DRESSINGS) ×2 IMPLANT
BNDG ELASTIC 6X10 VLCR STRL LF (GAUZE/BANDAGES/DRESSINGS) IMPLANT
BNDG ELASTIC 6X5.8 VLCR STR LF (GAUZE/BANDAGES/DRESSINGS) ×2 IMPLANT
BNDG GAUZE DERMACEA FLUFF (GAUZE/BANDAGES/DRESSINGS) ×2
BNDG GAUZE DERMACEA FLUFF 4 (GAUZE/BANDAGES/DRESSINGS) IMPLANT
BNDG GAUZE ELAST 4 BULKY (GAUZE/BANDAGES/DRESSINGS) ×2 IMPLANT
CABLE SURGICAL S-101-97-12 (CABLE) ×2 IMPLANT
CANISTER SUCT 3000ML PPV (MISCELLANEOUS) ×2 IMPLANT
CANNULA MC2 2 STG 29/37 NON-V (CANNULA) ×2 IMPLANT
CANNULA MC2 TWO STAGE (CANNULA) ×2
CANNULA NON VENT 20FR 12 (CANNULA) ×2 IMPLANT
CANNULA NON VENT 22FR 12 (CANNULA) IMPLANT
CATH ROBINSON RED A/P 18FR (CATHETERS) ×4 IMPLANT
CLIP RETRACTION 3.0MM CORONARY (MISCELLANEOUS) ×2 IMPLANT
CLIP TI MEDIUM 24 (CLIP) IMPLANT
CLIP VESOCCLUDE MED 24/CT (CLIP) IMPLANT
CLIP VESOCCLUDE SM WIDE 24/CT (CLIP) IMPLANT
CONN ST 1/2X1/2  BEN (MISCELLANEOUS) ×2
CONN ST 1/2X1/2 BEN (MISCELLANEOUS) ×2 IMPLANT
CONNECTOR BLAKE 2:1 CARIO BLK (MISCELLANEOUS) ×2 IMPLANT
CONTAINER PROTECT SURGISLUSH (MISCELLANEOUS) ×4 IMPLANT
DERMABOND IMPLANT
DERMABOND ADVANCED (GAUZE/BANDAGES/DRESSINGS) ×2
DERMABOND ADVANCED .7 DNX12 (GAUZE/BANDAGES/DRESSINGS) IMPLANT
DRAIN CHANNEL 19F RND (DRAIN) ×6 IMPLANT
DRAIN CONNECTOR BLAKE 1:1 (MISCELLANEOUS) ×2 IMPLANT
DRAPE CARDIOVASCULAR INCISE (DRAPES) ×2
DRAPE INCISE IOBAN 66X45 STRL (DRAPES) IMPLANT
DRAPE SRG 135X102X78XABS (DRAPES) ×2 IMPLANT
DRAPE WARM FLUID 44X44 (DRAPES) ×2 IMPLANT
DRSG AQUACEL AG ADV 3.5X10 (GAUZE/BANDAGES/DRESSINGS) ×2 IMPLANT
DRSG COVADERM 4X14 (GAUZE/BANDAGES/DRESSINGS) ×2 IMPLANT
ELECT BLADE 4.0 EZ CLEAN MEGAD (MISCELLANEOUS) ×2
ELECT REM PT RETURN 9FT ADLT (ELECTROSURGICAL) ×4
ELECTRODE BLDE 4.0 EZ CLN MEGD (MISCELLANEOUS) ×2 IMPLANT
ELECTRODE REM PT RTRN 9FT ADLT (ELECTROSURGICAL) ×4 IMPLANT
FELT TEFLON 1X6 (MISCELLANEOUS) ×4 IMPLANT
GAUZE 4X4 16PLY ~~LOC~~+RFID DBL (SPONGE) ×2 IMPLANT
GAUZE SPONGE 4X4 12PLY STRL (GAUZE/BANDAGES/DRESSINGS) ×4 IMPLANT
GLOVE BIO SURGEON STRL SZ7 (GLOVE) ×4 IMPLANT
GLOVE BIOGEL M STRL SZ7.5 (GLOVE) ×4 IMPLANT
GOWN STRL REUS W/ TWL LRG LVL3 (GOWN DISPOSABLE) ×8 IMPLANT
GOWN STRL REUS W/ TWL XL LVL3 (GOWN DISPOSABLE) ×4 IMPLANT
GOWN STRL REUS W/TWL LRG LVL3 (GOWN DISPOSABLE) ×8
GOWN STRL REUS W/TWL XL LVL3 (GOWN DISPOSABLE) ×4
HEMOSTAT POWDER SURGIFOAM 1G (HEMOSTASIS) ×6 IMPLANT
INSERT FOGARTY XLG (MISCELLANEOUS) IMPLANT
INSERT SUTURE HOLDER (MISCELLANEOUS) ×2 IMPLANT
KIT BASIN OR (CUSTOM PROCEDURE TRAY) ×2 IMPLANT
KIT SUCTION CATH 14FR (SUCTIONS) ×2 IMPLANT
KIT TURNOVER KIT B (KITS) ×2 IMPLANT
KIT VASOVIEW HEMOPRO 2 VH 4000 (KITS) ×2 IMPLANT
LEAD PACING MYOCARDI (MISCELLANEOUS) ×2 IMPLANT
MARKER GRAFT CORONARY BYPASS (MISCELLANEOUS) ×6 IMPLANT
NS IRRIG 1000ML POUR BTL (IV SOLUTION) ×10 IMPLANT
PACK ACCESSORY CANNULA KIT (KITS) ×2 IMPLANT
PACK E OPEN HEART (SUTURE) ×2 IMPLANT
PACK OPEN HEART (CUSTOM PROCEDURE TRAY) ×2 IMPLANT
PAD ARMBOARD 7.5X6 YLW CONV (MISCELLANEOUS) ×4 IMPLANT
PAD ELECT DEFIB RADIOL ZOLL (MISCELLANEOUS) ×2 IMPLANT
PENCIL BUTTON HOLSTER BLD 10FT (ELECTRODE) ×2 IMPLANT
POSITIONER HEAD DONUT 9IN (MISCELLANEOUS) ×2 IMPLANT
PUNCH AORTIC ROTATE 4.0MM (MISCELLANEOUS) ×2 IMPLANT
SET MPS 3-ND DEL (MISCELLANEOUS) IMPLANT
SPONGE T-LAP 18X18 ~~LOC~~+RFID (SPONGE) ×8 IMPLANT
SUPPORT HEART JANKE-BARRON (MISCELLANEOUS) ×2 IMPLANT
SUT BONE WAX W31G (SUTURE) ×2 IMPLANT
SUT ETHIBOND X763 2 0 SH 1 (SUTURE) ×4 IMPLANT
SUT MNCRL AB 3-0 PS2 18 (SUTURE) ×4 IMPLANT
SUT MNCRL AB 4-0 PS2 18 (SUTURE) IMPLANT
SUT PDS AB 1 CTX 36 (SUTURE) ×4 IMPLANT
SUT PROLENE 4 0 SH DA (SUTURE) ×2 IMPLANT
SUT PROLENE 5 0 C 1 36 (SUTURE) ×6 IMPLANT
SUT PROLENE 7 0 BV1 MDA (SUTURE) ×2 IMPLANT
SUT STEEL 6MS V (SUTURE) ×4 IMPLANT
SUT VIC AB 2-0 CT1 27 (SUTURE) ×2
SUT VIC AB 2-0 CT1 TAPERPNT 27 (SUTURE) IMPLANT
SYSTEM SAHARA CHEST DRAIN ATS (WOUND CARE) ×2 IMPLANT
TOWEL GREEN STERILE (TOWEL DISPOSABLE) ×2 IMPLANT
TOWEL GREEN STERILE FF (TOWEL DISPOSABLE) ×2 IMPLANT
TRAY FOLEY SLVR 16FR TEMP STAT (SET/KITS/TRAYS/PACK) ×2 IMPLANT
TUBING LAP HI FLOW INSUFFLATIO (TUBING) ×2 IMPLANT
UNDERPAD 30X36 HEAVY ABSORB (UNDERPADS AND DIAPERS) ×2 IMPLANT
WATER STERILE IRR 1000ML POUR (IV SOLUTION) ×4 IMPLANT

## 2022-08-17 NOTE — Brief Op Note (Signed)
08/17/2022  1:44 PM  PATIENT:  Antonio Patterson  71 y.o. male  PRE-OPERATIVE DIAGNOSIS:  CORONARY ARTERY DISEASE  POST-OPERATIVE DIAGNOSIS:  CORONARY ARTERY DISEASE  PROCEDURE:    CORONARY ARTERY BYPASS GRAFTING (CABG) X3 BYPASSES   USING OPEN LEFT INTERNAL MAMMARY ARTERY -LIMA to LAD  ENDOSCOPIC RIGHT GREATER SAPHENOUS VEIN HARVEST.  -SVG to PDA -SVG to OM Vein harvest time: Vein prep time:  TRANSESOPHAGEAL ECHOCARDIOGRAM (TEE) (N/A)  SURGEON:  Surgeon(s) and Role:   Lightfoot, Eliezer Lofts, MD - Primary  PHYSICIAN ASSISTANT: Aloha Gell, PA-C, Jillyn Hidden PA-C  ASSISTANTS: Norton Pastel RNFA   ANESTHESIA:   general  EBL:  800 mL   BLOOD ADMINISTERED:   CC CELLSAVER  DRAINS:  Mediastinal and left pleural chest tubes     LOCAL MEDICATIONS USED:  NONE  SPECIMEN:  No Specimen  DISPOSITION OF SPECIMEN:  N/A  COUNTS CORRECT:  YES  TOURNIQUET:  * No tourniquets in log *  DICTATION: .Dragon Dictation  PLAN OF CARE: Admit to inpatient   PATIENT DISPOSITION:  ICU - intubated and hemodynamically stable.   Delay start of Pharmacological VTE agent (>24hrs) due to surgical blood loss or risk of bleeding: yes

## 2022-08-17 NOTE — Progress Notes (Signed)
  Echocardiogram Echocardiogram Transesophageal has been performed.  Augustine Radar 08/17/2022, 9:12 AM

## 2022-08-17 NOTE — Anesthesia Procedure Notes (Signed)
Arterial Line Insertion Start/End8/24/2023 7:05 AM, 08/17/2022 7:10 AM Performed by: CRNA  Patient location: Pre-op. Preanesthetic checklist: patient identified, IV checked, site marked, risks and benefits discussed, surgical consent, monitors and equipment checked, pre-op evaluation, timeout performed and anesthesia consent Lidocaine 1% used for infiltration Left, radial was placed Catheter size: 20 G Hand hygiene performed  and maximum sterile barriers used   Attempts: 1 Procedure performed without using ultrasound guided technique. Following insertion, dressing applied and Biopatch. Post procedure assessment: normal and unchanged

## 2022-08-17 NOTE — Op Note (Signed)
[address]       [place] [address]             [phone]                                          08/17/2022 Patient:  Antonio Patterson Pre-Op Dx: 3V CAD HTN HLP   DM Post-op Dx:  same Procedure: CABG X 3.  LIMA LAD, RSVG PDA, OM1   Endoscopic greater saphenous vein harvest on the right   Surgeon and Role:      * Haden Cavenaugh, Eliezer Lofts, MD - Primary    * B. Stehler , PA-C - assisting An experienced assistant was required given the complexity of this surgery and the standard of surgical care. The assistant was needed for exposure, dissection, suctioning, retraction of delicate tissues and sutures, instrument exchange and for overall help during this procedure.    Anesthesia  general EBL:  Blood Administration: none Xclamp Time:  53 min Pump Time:   Drains: 73 F blake drain: , mediastinal  Wires: ventricular Counts: correct   Indications: 71 year old male with left main/three-vessel coronary artery disease.  He has preserved biventricular function and no significant valvular disease.  He has good distal targets on his PDA, LAD and obtuse marginal.  We discussed the risks and benefits of surgical revascularization and he is agreeable to proceed.    Findings: Heavily calcified LAD.  Intramyocardial mid portion.  Good PDA, calcified OM.  Good LIMA, and vein  Operative Technique: All invasive lines were placed in pre-op holding.  After the risks, benefits and alternatives were thoroughly discussed, the patient was brought to the operative theatre.  Anesthesia was induced, and the patient was prepped and draped in normal sterile fashion.  An appropriate surgical pause was performed, and pre-operative antibiotics were dosed accordingly.  We began with simultaneous incisions along the right leg for harvesting of the greater saphenous vein and the chest for the sternotomy.  In regards to the sternotomy, this was carried down with bovie cautery,  and the sternum was divided with a reciprocating saw.  Meticulous hemostasis was obtained.  The left internal thoracic artery was exposed and harvested in in pedicled fashion.  The patient was systemically heparinized, and the artery was divided distally, and placed in a papaverine sponge.    The sternal elevator was removed, and a retractor was placed.  The pericardium was divided in the midline and fashioned into a cradle with pericardial stitches.   After we confirmed an appropriate ACT, the ascending aorta was cannulated in standard fashion.  The right atrial appendage was used for venous cannulation site.  Cardiopulmonary bypass was initiated, and the heart retractor was placed. The cross clamp was applied, and a dose of anterograde cardioplegia was given with good arrest of the heart.  We moved to the posterior wall of the heart, and found a good target on the PD.  An arteriotomy was made, and the vein graft was anastomosed to it in an end to side fashion.  Next we exposed the lateral wall, and found a good target on the OM.  An end to side anastomosis with the vein graft was then created.  Finally, we exposed a good target on the LAD, and fashioned an end to side anastomosis between it and the LITA.  We began to  re-warm, and a re-animation dose of cardioplegia was given.  The heart was de-aired, and the cross clamp was removed.  Meticulous hemostasis was obtained.    A partial occludding clamp was then placed on the ascending aorta, and we created an end to side anastomosis between it and the proximal vein grafts.  Rings were placed on the proximal anastomosis.  Hemostasis was obtained, and we separated from cardiopulmonary bypass without event.  The heparin was reversed with protamine.  Chest tubes and wires were placed, and the sternum was re-approximated with sternal wires.  The soft tissue and skin were re-approximated wth absorbable suture.    The patient tolerated the procedure without any  immediate complications, and was transferred to the ICU in guarded condition.  Antonio Patterson

## 2022-08-17 NOTE — Hospital Course (Addendum)
History of Present Illness:  Antonio Patterson is a 71 year old male with a history of left main/three-vessel coronary artery disease, diabetes, and hypertension. Over the past several months he has had some exertional dyspnea. He also admits to occasionally having some palpitations, but this resolves with deep breathing. He denies any chest pain. He denies any lower extremity swelling. He ultimately underwent a left heart catheterization which identified severe 3 vessel coronary artery disease including moderate distal LMCA and moderate diffuse LAD disease, 90% ostial Lcx and 95% proximal RCA lesions. His EKG showed no ischemic changes. His echo shows he has an EF of 60-65%.   The patient was consulted on by cardiothoracic surgery for possible surgical revascularization. Dr. Cliffton Asters reviewed the patient's history, chart and lab results and it was ultimately thought that coronary artery bypass grafting would provide the best long term treatment for this patient. The patient's options were discussed with him as well as the risks and benefits of surgical revascularization. After careful consideration, the patient decided to move forward with coronary artery bypass grafting surgery.  Hospital Course:  Antonio Patterson presented to [facility] on 08/17/2022 for coronary artery bypass grafting surgery. He was brought back to the operating room and underwent a CABG x 3 with endoscopic vein harvest utilizing LIMA to LAD, SVG to PDA, and SVG to OM. He tolerated the procedure without difficulty and was transferred to the SICU in stable condition.  He remained hemodynamically stable.  He was extubated using routine protocols at 6 PM on the day of surgery.  He was weaned from pressor support by the morning of the first postoperative day.  On postop day 1, the arterial line was removed.  He was mobilized and progressed satisfactorily.  The chest tubes were removed on postop day 2.  He was started back on aspirin,  statin, and metoprolol.  The Foley catheter was removed and he was started back on his usual dose of Flomax.

## 2022-08-17 NOTE — Anesthesia Procedure Notes (Signed)
Procedure Name: Intubation Date/Time: 08/17/2022 8:02 AM  Performed by: Erick Colace, CRNAPre-anesthesia Checklist: Patient identified, Emergency Drugs available, Suction available and Patient being monitored Patient Re-evaluated:Patient Re-evaluated prior to induction Oxygen Delivery Method: Circle system utilized Preoxygenation: Pre-oxygenation with 100% oxygen Induction Type: IV induction Ventilation: Mask ventilation without difficulty Laryngoscope Size: Mac and 4 Grade View: Grade I Tube type: Oral Tube size: 8.0 mm Number of attempts: 1 Airway Equipment and Method: Stylet and Oral airway Placement Confirmation: ETT inserted through vocal cords under direct vision, positive ETCO2 and breath sounds checked- equal and bilateral Secured at: 22 cm Tube secured with: Tape Dental Injury: Teeth and Oropharynx as per pre-operative assessment

## 2022-08-17 NOTE — Anesthesia Procedure Notes (Signed)
Central Venous Catheter Insertion Performed by: Albertha Ghee, MD, anesthesiologist Start/End8/24/2023 7:12 AM, 08/17/2022 7:24 AM Patient location: Pre-op. Preanesthetic checklist: patient identified, IV checked, site marked, risks and benefits discussed, surgical consent, monitors and equipment checked, pre-op evaluation, timeout performed and anesthesia consent Position: Trendelenburg Lidocaine 1% used for infiltration and patient sedated Hand hygiene performed , maximum sterile barriers used  and Seldinger technique used Catheter size: 9 Fr Central line was placed.MAC introducer Procedure performed using ultrasound guided technique. Ultrasound Notes:anatomy identified, needle tip was noted to be adjacent to the nerve/plexus identified, no ultrasound evidence of intravascular and/or intraneural injection and image(s) printed for medical record Attempts: 1 Following insertion, line sutured, dressing applied and Biopatch. Post procedure assessment: blood return through all ports, free fluid flow and no air  Patient tolerated the procedure well with no immediate complications.

## 2022-08-17 NOTE — Anesthesia Preprocedure Evaluation (Signed)
Anesthesia Evaluation  Patient identified by MRN, date of birth, ID band Patient awake    Reviewed: Allergy & Precautions, H&P , NPO status , Patient's Chart, lab work & pertinent test results  Airway Mallampati: II   Neck ROM: full    Dental   Pulmonary shortness of breath, former smoker,    breath sounds clear to auscultation       Cardiovascular hypertension, + CAD and + Peripheral Vascular Disease   Rhythm:regular Rate:Normal  TTE (07/14/22): EF 60-65%   Neuro/Psych  Headaches,    GI/Hepatic hiatal hernia, GERD  ,  Endo/Other  diabetes, Type 2  Renal/GU      Musculoskeletal   Abdominal   Peds  Hematology   Anesthesia Other Findings   Reproductive/Obstetrics                            Anesthesia Physical Anesthesia Plan  ASA: 3  Anesthesia Plan: General   Post-op Pain Management:    Induction: Intravenous  PONV Risk Score and Plan: 2 and Ondansetron, Dexamethasone, Treatment may vary due to age or medical condition and Midazolam  Airway Management Planned: Oral ETT  Additional Equipment: Arterial line, CVP, PA Cath, TEE and Ultrasound Guidance Line Placement  Intra-op Plan:   Post-operative Plan: Post-operative intubation/ventilation  Informed Consent: I have reviewed the patients History and Physical, chart, labs and discussed the procedure including the risks, benefits and alternatives for the proposed anesthesia with the patient or authorized representative who has indicated his/her understanding and acceptance.     Dental advisory given  Plan Discussed with: CRNA, Anesthesiologist and Surgeon  Anesthesia Plan Comments:         Anesthesia Quick Evaluation

## 2022-08-17 NOTE — Procedures (Signed)
Extubation Procedure Note  Patient Details:   Name: Antonio Patterson DOB: 26-Apr-1951 MRN: [id number]   Airway Documentation:    Vent end date: 08/17/22 Vent end time: 1755   Evaluation  O2 sats: stable throughout Complications: No apparent complications Patient did tolerate procedure well. Bilateral Breath Sounds: Clear, Diminished   Yes, pt able to cough to clear secretions and hoarsely vocalize name. Pt extubated to humidified nasal cannula and tolerating well at this time  Tacy Learn 08/17/2022, 5:57 PM

## 2022-08-17 NOTE — Interval H&P Note (Signed)
History and Physical Interval Note:  08/17/2022 7:39 AM  Antonio Patterson  has presented today for surgery, with the diagnosis of CAD.  The various methods of treatment have been discussed with the patient and family. After consideration of risks, benefits and other options for treatment, the patient has consented to  Procedure(s): CORONARY ARTERY BYPASS GRAFTING (CABG) (N/A) TRANSESOPHAGEAL ECHOCARDIOGRAM (TEE) (N/A) as a surgical intervention.  The patient's history has been reviewed, patient examined, no change in status, stable for surgery.  I have reviewed the patient's chart and labs.  Questions were answered to the patient's satisfaction.     Caylie Sandquist Keane Scrape

## 2022-08-17 NOTE — Transfer of Care (Signed)
Immediate Anesthesia Transfer of Care Note  Patient: Antonio Patterson  Procedure(s) Performed: CORONARY ARTERY BYPASS GRAFTING (CABG) X3 BYPASSES USING OPEN LEFT INTERNAL MAMMARY ARTERY AND ENDOSCOPIC RIGHT GREATER SAPHENOUS VEIN HARVEST. (Chest) TRANSESOPHAGEAL ECHOCARDIOGRAM (TEE) (Esophagus)  Patient Location: ICU  Anesthesia Type:General  Level of Consciousness: Patient remains intubated per anesthesia plan  Airway & Oxygen Therapy: Patient remains intubated per anesthesia plan and Patient placed on Ventilator (see vital sign flow sheet for setting)  Post-op Assessment: Report given to RN and Post -op Vital signs reviewed and stable  Post vital signs: Reviewed and stable  Last Vitals:  Vitals Value Taken Time  BP 95/60   Temp    Pulse 88   Resp 15 08/17/22 1347  SpO2 100   Vitals shown include unvalidated device data.  Last Pain:  Vitals:   08/17/22 0558  PainSc: 0-No pain      Patients Stated Pain Goal: 0 (08/17/22 0558)  Complications: No notable events documented.

## 2022-08-18 ENCOUNTER — Encounter (HOSPITAL_COMMUNITY): Payer: Self-pay | Admitting: Thoracic Surgery (Cardiothoracic Vascular Surgery)

## 2022-08-18 ENCOUNTER — Inpatient Hospital Stay (HOSPITAL_COMMUNITY): Payer: Medicare Other

## 2022-08-18 LAB — CBC
HCT: 35.9 % — ABNORMAL LOW (ref 39.0–52.0)
HCT: 36.5 % — ABNORMAL LOW (ref 39.0–52.0)
Hemoglobin: 11.4 g/dL — ABNORMAL LOW (ref 13.0–17.0)
Hemoglobin: 11.9 g/dL — ABNORMAL LOW (ref 13.0–17.0)
MCH: 28.4 pg (ref 26.0–34.0)
MCH: 29.5 pg (ref 26.0–34.0)
MCHC: 31.8 g/dL (ref 30.0–36.0)
MCHC: 32.6 g/dL (ref 30.0–36.0)
MCV: 89.5 fL (ref 80.0–100.0)
MCV: 90.3 fL (ref 80.0–100.0)
Platelets: 152 10*3/uL (ref 150–400)
Platelets: 147 10*3/uL — ABNORMAL LOW (ref 150–400)
RBC: 4.01 MIL/uL — ABNORMAL LOW (ref 4.22–5.81)
RBC: 4.04 MIL/uL — ABNORMAL LOW (ref 4.22–5.81)
RDW: 13.7 % (ref 11.5–15.5)
RDW: 13.8 % (ref 11.5–15.5)
WBC: 13 10*3/uL — ABNORMAL HIGH (ref 4.0–10.5)
WBC: 15.4 10*3/uL — ABNORMAL HIGH (ref 4.0–10.5)
nRBC: 0 % (ref 0.0–0.2)
nRBC: 0 % (ref 0.0–0.2)

## 2022-08-18 LAB — POCT I-STAT 7, (LYTES, BLD GAS, ICA,H+H)
Acid-base deficit: 6 mmol/L — ABNORMAL HIGH (ref 0.0–2.0)
Bicarbonate: 20 mmol/L (ref 20.0–28.0)
Calcium, Ion: 1.16 mmol/L (ref 1.15–1.40)
HCT: 31 % — ABNORMAL LOW (ref 39.0–52.0)
Hemoglobin: 10.5 g/dL — ABNORMAL LOW (ref 13.0–17.0)
O2 Saturation: 97 %
Patient temperature: 36.9
Potassium: 4.2 mmol/L (ref 3.5–5.1)
Sodium: 140 mmol/L (ref 135–145)
TCO2: 21 mmol/L — ABNORMAL LOW (ref 22–32)
pCO2 arterial: 40.8 mmHg (ref 32–48)
pH, Arterial: 7.298 — ABNORMAL LOW (ref 7.35–7.45)
pO2, Arterial: 98 mmHg (ref 83–108)

## 2022-08-18 LAB — GLUCOSE, CAPILLARY
Glucose-Capillary: 130 mg/dL — ABNORMAL HIGH (ref 70–99)
Glucose-Capillary: 132 mg/dL — ABNORMAL HIGH (ref 70–99)
Glucose-Capillary: 137 mg/dL — ABNORMAL HIGH (ref 70–99)
Glucose-Capillary: 157 mg/dL — ABNORMAL HIGH (ref 70–99)
Glucose-Capillary: 171 mg/dL — ABNORMAL HIGH (ref 70–99)
Glucose-Capillary: 187 mg/dL — ABNORMAL HIGH (ref 70–99)
Glucose-Capillary: 192 mg/dL — ABNORMAL HIGH (ref 70–99)
Glucose-Capillary: 197 mg/dL — ABNORMAL HIGH (ref 70–99)
Glucose-Capillary: 74 mg/dL (ref 70–99)
Glucose-Capillary: 97 mg/dL (ref 70–99)

## 2022-08-18 LAB — BASIC METABOLIC PANEL
Anion gap: 5 (ref 5–15)
Anion gap: 6 (ref 5–15)
BUN: 16 mg/dL (ref 8–23)
BUN: 15 mg/dL (ref 8–23)
CO2: 21 mmol/L — ABNORMAL LOW (ref 22–32)
CO2: 25 mmol/L (ref 22–32)
Calcium: 8.1 mg/dL — ABNORMAL LOW (ref 8.9–10.3)
Calcium: 8.7 mg/dL — ABNORMAL LOW (ref 8.9–10.3)
Chloride: 110 mmol/L (ref 98–111)
Chloride: 103 mmol/L (ref 98–111)
Creatinine, Ser: 0.86 mg/dL (ref 0.61–1.24)
Creatinine, Ser: 1 mg/dL (ref 0.61–1.24)
GFR, Estimated: >60 mL/min (ref >60)
GFR, Estimated: >60 mL/min (ref >60)
Glucose, Bld: 149 mg/dL — ABNORMAL HIGH (ref 70–99)
Glucose, Bld: 207 mg/dL — ABNORMAL HIGH (ref 70–99)
Potassium: 4.6 mmol/L (ref 3.5–5.1)
Potassium: 4.5 mmol/L (ref 3.5–5.1)
Sodium: 136 mmol/L (ref 135–145)
Sodium: 134 mmol/L — ABNORMAL LOW (ref 135–145)

## 2022-08-18 LAB — MAGNESIUM
Magnesium: 2 mg/dL (ref 1.7–2.4)
Magnesium: 2.2 mg/dL (ref 1.7–2.4)

## 2022-08-18 LAB — ECHO INTRAOPERATIVE TEE
Height: 67 in
Weight: 3264 oz

## 2022-08-18 MED ORDER — INSULIN ASPART 100 UNIT/ML IJ SOLN: 0.0000 [IU] | INTRAMUSCULAR | Status: DC

## 2022-08-18 MED ORDER — INSULIN DETEMIR 100 UNIT/ML ~~LOC~~ SOLN
20.0000 [IU] | Freq: Every day | SUBCUTANEOUS | Status: DC
  Administered 2022-08-19 – 2022-08-21 (×3): 20 [IU] via SUBCUTANEOUS
  Filled 2022-08-18 (×3): qty 0.2

## 2022-08-18 MED ORDER — INSULIN ASPART 100 UNIT/ML IJ SOLN
0.0000 [IU] | INTRAMUSCULAR | Status: DC
  Administered 2022-08-18 (×2): 4 [IU] via SUBCUTANEOUS
  Administered 2022-08-18: 2 [IU] via SUBCUTANEOUS
  Administered 2022-08-18 (×2): 4 [IU] via SUBCUTANEOUS
  Administered 2022-08-19: 8 [IU] via SUBCUTANEOUS

## 2022-08-18 MED ORDER — ENOXAPARIN SODIUM 40 MG/0.4ML IJ SOSY
40.0000 mg | PREFILLED_SYRINGE | Freq: Every day | INTRAMUSCULAR | Status: DC
  Administered 2022-08-18 – 2022-08-20 (×3): 40 mg via SUBCUTANEOUS
  Filled 2022-08-18 (×3): qty 0.4

## 2022-08-18 MED ORDER — INSULIN DETEMIR 100 UNIT/ML ~~LOC~~ SOLN
12.0000 [IU] | Freq: Once | SUBCUTANEOUS | Status: AC
  Administered 2022-08-18: 12 [IU] via SUBCUTANEOUS
  Filled 2022-08-18: qty 0.12

## 2022-08-18 MED FILL — Potassium Chloride Inj 2 mEq/ML: INTRAVENOUS | Qty: 40 | Status: AC

## 2022-08-18 MED FILL — Sodium Chloride IV Soln 0.9%: INTRAVENOUS | Qty: 2000 | Status: AC

## 2022-08-18 MED FILL — Mannitol IV Soln 20%: INTRAVENOUS | Qty: 500 | Status: AC

## 2022-08-18 MED FILL — Calcium Chloride Inj 10%: INTRAVENOUS | Qty: 10 | Status: AC

## 2022-08-18 MED FILL — Heparin Sodium (Porcine) Inj 1000 Unit/ML: INTRAMUSCULAR | Qty: 10 | Status: AC

## 2022-08-18 MED FILL — Heparin Sodium (Porcine) Inj 1000 Unit/ML: Qty: 1000 | Status: AC

## 2022-08-18 MED FILL — Electrolyte-R (PH 7.4) Solution: INTRAVENOUS | Qty: 3000 | Status: AC

## 2022-08-18 MED FILL — Lidocaine HCl Local Preservative Free (PF) Inj 2%: INTRAMUSCULAR | Qty: 15 | Status: AC

## 2022-08-18 MED FILL — Sodium Bicarbonate IV Soln 8.4%: INTRAVENOUS | Qty: 50 | Status: AC

## 2022-08-18 NOTE — Discharge Summary (Signed)
Physician Discharge Summary  Patient ID: Antonio Patterson MRN: [id number] DOB/AGE: 72-May-1952 29 y.o.  Admit date: 08/17/2022 Discharge date: 08/21/2022  Admission Diagnoses:  Multivessel coronary artery disease Angina pectoris Type 2 diabetes mellitus Benign prostatic hyperplasia Dyslipidemia History of hypertension  Discharge Diagnoses:   Multivessel coronary artery disease Angina pectoris Type 2 diabetes mellitus Benign prostatic hyperplasia Dyslipidemia History of hypertension S/P CABG x 3 Expected acute blood loss anemia   Discharged Condition: good  History of Present Illness:  Antonio Patterson is a 71 year old male with a history of left main/three-vessel coronary artery disease, diabetes, and hypertension. Over the past several months he has had some exertional dyspnea. He also admits to occasionally having some palpitations, but this resolves with deep breathing. He denies any chest pain. He denies any lower extremity swelling. He ultimately underwent a left heart catheterization which identified severe 3 vessel coronary artery disease including moderate distal LMCA and moderate diffuse LAD disease, 90% ostial Lcx and 95% proximal RCA lesions. His EKG showed no ischemic changes. His echo shows he has an EF of 60-65%.   The patient was consulted on by cardiothoracic surgery for possible surgical revascularization. Dr. Kipp Brood reviewed the patient's history, chart and lab results and it was ultimately thought that coronary artery bypass grafting would provide the best long term treatment for this patient. The patient's options were discussed with him as well as the risks and benefits of surgical revascularization. After careful consideration, the patient decided to move forward with coronary artery bypass grafting surgery.  Hospital Course:  Mr. [name] presented to [facility] on 08/17/2022 for coronary artery bypass grafting surgery. He was brought back to the  operating room and underwent a CABG x 3 with endoscopic vein harvest utilizing LIMA to LAD, SVG to PDA, and SVG to OM. He tolerated the procedure without difficulty and was transferred to the SICU in stable condition.  He remained hemodynamically stable.  He was extubated using routine protocols at 6 PM on the day of surgery.  He was weaned from pressor support by the morning of the first postoperative day.  On postop day 1, the arterial line was removed.  He was mobilized and progressed satisfactorily.  The chest tubes were removed on postop day 2.  He was started back on aspirin, statin, and metoprolol.  The Foley catheter was removed and he was started back on his usual dose of Flomax.  He had no further ventricular ectopy and was transferred to the progressive care unit on 08/19/2022.  The patient continued to progress.  He was maintaining NSR and his pacing wires were removed without difficulty.  He was restarted on his home Cozaar at a reduced dose for hypertension.  He was treated with aggressive IV diuretics with good response.  He was weaned off oxygen as tolerated. His CXR on POD3 showed stable bilateral pleural effusions and he began saturating in the 90's on room air. On POD4 his hypertension resolved with Cozaar and he continued to maintain sinus rhythm with minimal ventricular ectopy. He had a small bowel movement on POD4. He was felt stable for discharge to home.  Consults: None  Significant Diagnostic Studies:   DG Chest Port 1 View  Result Date: 08/20/2022 CLINICAL DATA:  History of CABG EXAM: PORTABLE CHEST 1 VIEW COMPARISON:  August 19, 2022 FINDINGS: Bilateral pleural effusions and underlying atelectasis are stable. The cardiomediastinal silhouette is stable. No pneumothorax. No overt edema. No other acute abnormalities. IMPRESSION: 1. Small bilateral pleural effusions  with underlying atelectasis are stable. No other interval changes or acute abnormalities. Electronically Signed   By:  Dorise Bullion III M.D.   On: 08/20/2022 08:23   DG Chest Port 1 View  Result Date: 08/19/2022 CLINICAL DATA:  Status post CABG EXAM: PORTABLE CHEST 1 VIEW COMPARISON:  August 18, 2022 FINDINGS: Small bilateral pleural effusions with underlying atelectasis. Stable right central line terminating in the SVC. No pneumothorax. No overt edema. The cardiomediastinal silhouette is stable. IMPRESSION: Stable support apparatus. Stable small effusions with underlying atelectasis. No acute interval changes. Electronically Signed   By: Dorise Bullion III M.D.   On: 08/19/2022 08:36   DG Chest Port 1 View  Result Date: 08/18/2022 CLINICAL DATA:  Status post CABG.  Chest tube present. EXAM: PORTABLE CHEST 1 VIEW COMPARISON:  Chest x-ray 08/17/2022. FINDINGS: Probable small bilateral pleural effusions. No visible pneumothorax. Overlying bibasilar opacities. Low lung volumes. Cardiomediastinal silhouette is enlarged, unchanged. Pulmonary vascular congestion. CABG and median sternotomy. Right IJ approach central venous catheter sheath with the tip projecting at the superior cavoatrial junction. IMPRESSION: 1. Low lung volumes with small bilateral pleural effusions and overlying bibasilar atelectasis and/or consolidation. 2. Cardiomegaly and pulmonary vascular congestion.  CABG. Electronically Signed   By: Margaretha Sheffield M.D.   On: 08/18/2022 08:04   ECHO INTRAOPERATIVE TEE  Result Date: 08/18/2022  *INTRAOPERATIVE TRANSESOPHAGEAL REPORT *  Patient Name:   Antonio Patterson Date of Exam: 08/17/2022 Medical Rec #:  [id number]       Height:       67.0 in Accession #:    [id number]      Weight:       204.0 lb Date of Birth:  05/14/1951       BSA:          2.04 m Patient Age:    30 years        BP:           116/68 mmHg Patient Gender: M               HR:           70 bpm. Exam Location:  Anesthesiology Transesophogeal exam was perform intraoperatively during surgical procedure. Patient was closely monitored under general  anesthesia during the entirety of examination. Indications:     Coronary Artery Disease Sonographer:     Bernadene Person RDCS Performing Phys: [id number] Lucile Crater LIGHTFOOT Diagnosing Phys: Albertha Ghee MD Complications: No known complications during this procedure. POST-OP IMPRESSIONS Overall, there were no significant changes from pre-bypass. PRE-OP FINDINGS  Left Ventricle: The left ventricle has hyperdynamic systolic function, with an ejection fraction of >65%. The cavity size was normal. There is no increase in left ventricular wall thickness. There is no left ventricular hypertrophy. Right Ventricle: The right ventricle has normal systolic function. The cavity was normal. There is no increase in right ventricular wall thickness. Left Atrium: Left atrial size was normal in size. No left atrial/left atrial appendage thrombus was detected. Right Atrium: Right atrial size was normal in size. Interatrial Septum: No atrial level shunt detected by color flow Doppler. There is no evidence of a patent foramen ovale. Pericardium: There is no evidence of pericardial effusion. Mitral Valve: The mitral valve is normal in structure. Mitral valve regurgitation is not visualized by color flow Doppler. There is No evidence of mitral stenosis. Tricuspid Valve: The tricuspid valve was normal in structure. Tricuspid valve regurgitation was not visualized by color flow Doppler. Aortic Valve: The aortic  valve is normal in structure. Aortic valve regurgitation was not visualized by color flow Doppler. There is no stenosis of the aortic valve. Pulmonic Valve: The pulmonic valve was normal in structure, with normal. Pulmonic valve regurgitation is not visualized by color flow Doppler. Aorta: The aortic root, ascending aorta and aortic arch are normal in size and structure. Shunts: There is no evidence of an atrial septal defect.  Albertha Ghee MD Electronically signed by Albertha Ghee MD Signature Date/Time: 08/18/2022/7:49:52 AM     Final    DG Chest Port 1 View  Result Date: 08/17/2022 CLINICAL DATA:  Postop CABG EXAM: PORTABLE CHEST 1 VIEW COMPARISON:  08/15/2022 FINDINGS: Postop CABG. Endotracheal tube in good position. NG tube in the body of the stomach. Right jugular central venous catheter tip in the proximal SVC. Surgical drain in the mediastinum. Diffuse bilateral airspace disease. This is greater on the left than the right and has developed since the preop study. No significant effusion. No pneumothorax. IMPRESSION: Support lines in good position.  No pneumothorax Hypoventilation with bilateral airspace disease. Probable edema, left greater than right. Electronically Signed   By: Franchot Gallo M.D.   On: 08/17/2022 14:01   DG Chest 2 View  Result Date: 08/16/2022 CLINICAL DATA:  Preop evaluation, coronary artery disease EXAM: CHEST - 2 VIEW COMPARISON:  None Available. FINDINGS: The heart size and mediastinal contours are within normal limits. Both lungs are clear. The visualized skeletal structures are unremarkable. IMPRESSION: No active cardiopulmonary disease. Electronically Signed   By: Elmer Picker M.D.   On: 08/16/2022 20:58   VAS US DOPPLER PRE CABG  Result Date: 08/15/2022 PREOPERATIVE VASCULAR EVALUATION Patient Name:  TYMEIR WEATHINGTON  Date of Exam:   08/15/2022 Medical Rec #: [id number]        Accession #:    [id number] Date of Birth: [birth year]        Patient Gender: M Patient Age:   71 years Exam Location:  [facility] Procedure:      VAS US DOPPLER PRE CABG Referring Phys: HARRELL LIGHTFOOT --------------------------------------------------------------------------------  Indications:      Pre-CABG. Risk Factors:     Hypertension, hyperlipidemia, Diabetes, coronary artery                   disease. Comparison Study: no prior Performing Technologist: Archie Patten RVS  Examination Guidelines: A complete evaluation includes B-mode imaging, spectral Doppler, color Doppler, and power Doppler as needed  of all accessible portions of each vessel. Bilateral testing is considered an integral part of a complete examination. Limited examinations for reoccurring indications may be performed as noted.  Right Carotid Findings: +----------+--------+--------+--------+--------------------------+---------+           PSV cm/sEDV cm/sStenosisDescribe                  Comments  +----------+--------+--------+--------+--------------------------+---------+ CCA Prox  105     10              heterogenous                        +----------+--------+--------+--------+--------------------------+---------+ CCA Distal92      12              heterogenous                        +----------+--------+--------+--------+--------------------------+---------+ ICA Prox  73      18      1-39%   heterogenous  and irregularShadowing +----------+--------+--------+--------+--------------------------+---------+ ICA Distal69      21                                                  +----------+--------+--------+--------+--------------------------+---------+ ECA       104                                                         +----------+--------+--------+--------+--------------------------+---------+ +----------+--------+-------+--------+------------+           PSV cm/sEDV cmsDescribeArm Pressure +----------+--------+-------+--------+------------+ Subclavian110                                 +----------+--------+-------+--------+------------+ +---------+--------+--+--------+--+---------+ VertebralPSV cm/s39EDV cm/s11Antegrade +---------+--------+--+--------+--+---------+ Left Carotid Findings: +---------+--------+-------+--------+---------------------------------+--------+          PSV cm/sEDV    StenosisDescribe                         Comments                  cm/s                                                      +---------+--------+-------+--------+---------------------------------+--------+ CCA Prox 114     15             homogeneous and calcific                  +---------+--------+-------+--------+---------------------------------+--------+ CCA      107     16             heterogenous                              Distal                                                                    +---------+--------+-------+--------+---------------------------------+--------+ ICA Prox 80      18     1-39%   heterogenous, calcific and                                                irregular                                 +---------+--------+-------+--------+---------------------------------+--------+ ICA      55      16  Distal                                                                    +---------+--------+-------+--------+---------------------------------+--------+ ECA      97                                                               +---------+--------+-------+--------+---------------------------------+--------+ +----------+--------+--------+--------+------------+ SubclavianPSV cm/sEDV cm/sDescribeArm Pressure +----------+--------+--------+--------+------------+           76                                   +----------+--------+--------+--------+------------+ +---------+--------+--+--------+-+---------+ VertebralPSV cm/s40EDV cm/s8Antegrade +---------+--------+--+--------+-+---------+  ABI Findings: +---------+------------------+-----+---------+--------+ Right    Rt Pressure (mmHg)IndexWaveform Comment  +---------+------------------+-----+---------+--------+ Brachial 126                    triphasic         +---------+------------------+-----+---------+--------+ PTA      118               0.94 biphasic          +---------+------------------+-----+---------+--------+ DP        100               0.79 biphasic          +---------+------------------+-----+---------+--------+ Great Toe89                0.71                   +---------+------------------+-----+---------+--------+ +---------+------------------+-----+---------+-------+ Left     Lt Pressure (mmHg)IndexWaveform Comment +---------+------------------+-----+---------+-------+ Brachial 124                    triphasic        +---------+------------------+-----+---------+-------+ PTA      168               1.33 triphasic        +---------+------------------+-----+---------+-------+ DP       136               1.08 triphasic        +---------+------------------+-----+---------+-------+ Galvin Proffer               0.98 Normal           +---------+------------------+-----+---------+-------+ +-------+---------------+----------------+ ABI/TBIToday's ABI/TBIPrevious ABI/TBI +-------+---------------+----------------+ Right  0.94           0.71             +-------+---------------+----------------+ Left   1.33           0.98             +-------+---------------+----------------+  Right Doppler Findings: +--------+--------+-----+---------+--------+ Site    PressureIndexDoppler  Comments +--------+--------+-----+---------+--------+ [id number]          triphasic         +--------+--------+-----+---------+--------+ Radial               triphasic         +--------+--------+-----+---------+--------+ Ulnar  triphasic         +--------+--------+-----+---------+--------+  Left Doppler Findings: +--------+--------+-----+---------+--------+ Site    PressureIndexDoppler  Comments +--------+--------+-----+---------+--------+ [id number]          triphasic         +--------+--------+-----+---------+--------+ Radial               triphasic         +--------+--------+-----+---------+--------+ Ulnar                triphasic          +--------+--------+-----+---------+--------+   Summary: Right Carotid: Velocities in the right ICA are consistent with a 1-39% stenosis. Left Carotid: Velocities in the left ICA are consistent with a 1-39% stenosis. Vertebrals: Bilateral vertebral arteries demonstrate antegrade flow. Right ABI: Resting right ankle-brachial index indicates mild right lower extremity arterial disease. The right toe-brachial index is normal. Left ABI: Resting left ankle-brachial index is within normal range. The left toe-brachial index is normal. Right Upper Extremity: Doppler waveforms remain within normal limits with right radial compression. Doppler waveforms decrease >50% with right ulnar compression. Left Upper Extremity: Doppler waveforms remain within normal limits with left radial compression. Doppler waveforms remain within normal limits with left ulnar compression.  Electronically signed by Servando Snare MD on 08/15/2022 at 2:07:39 PM.    Final      Treatments: Surgery  08/17/2022 Patient:  Chales Abrahams Pre-Op Dx: 3V CAD HTN HLP   DM Post-op Dx:  same Procedure: CABG X 3.  LIMA LAD, RSVG PDA, OM1   Endoscopic greater saphenous vein harvest on the right     Surgeon and Role:      * Lightfoot, Lucile Crater, MD - Primary    * B. Stehler , PA-C - assisting An experienced assistant was required given the complexity of this surgery and the standard of surgical care. The assistant was needed for exposure, dissection, suctioning, retraction of delicate tissues and sutures, instrument exchange and for overall help during this procedure.     Anesthesia  general EBL:  592m Blood Administration: none Xclamp Time:  53 min Pump Time:  961m   Drains: 1959 blake drain: , mediastinal  Wires: ventricular Counts: correct     Indications: [age]ear old male with left main/three-vessel coronary artery disease.  He has preserved biventricular function and no significant valvular disease.  He has good distal targets on  his PDA, LAD and obtuse marginal.  We discussed the risks and benefits of surgical revascularization and he is agreeable to proceed.     Findings: Heavily calcified LAD.  Intramyocardial mid portion.  Good PDA, calcified OM.  Good LIMA, and vein  Discharge Exam: Blood pressure 107/73, pulse 76, temperature 98.4 F (36.9 C), temperature source Oral, resp. rate 17, height 5' 7"  (1.702 m), weight 92.3 kg, SpO2 91 %.  General appearance: alert, cooperative, and no distress Neurologic: intact Heart: NSR with some ventricular ectopy  Lungs: Minimally diminished breath sounds bibasilar Abdomen: soft, non-tender; bowel sounds normal; no masses,  no organomegaly Extremities: extremities normal, atraumatic, no cyanosis or edema Wound: Clean and dry   Disposition: Discharge disposition: 01-Home or Self Care       Discharge Instructions     Amb Referral to Cardiac Rehabilitation   Complete by: As directed    Diagnosis: CABG   CABG X ___: 3   After initial evaluation and assessments completed: Virtual Based Care may be provided alone or in conjunction with Phase 2 Cardiac Rehab based on patient barriers.:  Yes   Intensive Cardiac Rehabilitation (ICR) [place] location only OR Traditional Cardiac Rehabilitation (TCR) If criteria for ICR are not met will enroll in TCR [facility] only): Yes      Allergies as of 08/21/2022       Reactions   Lipitor [atorvastatin]    Muscle pain   Metformin Diarrhea   Rosuvastatin    Muscle pain   Sulfa Antibiotics Rash   Unknown reaction   Sulfonamide Derivatives Rash   Unknown reaction        Medication List     STOP taking these medications    amLODipine 5 MG tablet Commonly known as: NORVASC   aspirin 81 MG tablet Replaced by: aspirin EC 325 MG tablet   ciprofloxacin 500 MG tablet Commonly known as: Cipro   hydrochlorothiazide 12.5 MG capsule Commonly known as: MICROZIDE   loratadine-pseudoephedrine 10-240 MG 24 hr tablet Commonly known as:  Claritin-D 24 Hour   nitroGLYCERIN 0.4 MG SL tablet Commonly known as: Nitrostat       TAKE these medications    acetaminophen 650 MG CR tablet Commonly known as: TYLENOL Take 1,300 mg by mouth every 8 (eight) hours as needed for pain.   aspirin EC 325 MG tablet Take 1 tablet (325 mg total) by mouth daily. Start taking on: August 22, 2022 Replaces: aspirin 81 MG tablet   glucose blood test strip 1 each by Other route in the morning, at noon, in the evening, and at bedtime.   losartan 25 MG tablet Commonly known as: COZAAR Take 1 tablet (25 mg total) by mouth daily. Start taking on: August 22, 2022 What changed:  medication strength how much to take   metoprolol succinate 25 MG 24 hr tablet Commonly known as: Toprol XL Take 1 tablet (25 mg total) by mouth daily.   NovoLOG FlexPen 100 UNIT/ML FlexPen Generic drug: insulin aspart Inject 5-40 Units into the skin See admin instructions. 3-5 times per day, and pen needles 1/day   omeprazole 40 MG capsule Commonly known as: PRILOSEC Take 40 mg by mouth daily.   oxymetazoline 0.05 % nasal spray Commonly known as: AFRIN Place 1 spray into both nostrils 2 (two) times daily.   simvastatin 40 MG tablet Commonly known as: ZOCOR Take 40 mg by mouth daily.   tamsulosin 0.4 MG Caps capsule Commonly known as: FLOMAX Take 1 capsule (0.4 mg total) by mouth daily.   traMADol 50 MG tablet Commonly known as: ULTRAM Take 1 tablet (50 mg total) by mouth every 6 (six) hours as needed for moderate pain. What changed:  when to take this reasons to take this   Vitamin D (Ergocalciferol) 1.25 MG (50000 UNIT) Caps capsule Commonly known as: DRISDOL Take one tablet wkly        Follow-up Information     Darreld Mclean, PA-C Follow up on 09/08/2022.   Specialties: Physician Assistant, Cardiology Why: Cardiology follow up at 10:05am Contact information: [address] [place] [address] [phone]          Lajuana Matte, MD Follow up on 09/01/2022.   Specialty: Cardiothoracic Surgery Why: This is a virtual follow up appointment at 2:50PM with Dr. Lowella Dell. Do not come to the office. Contact information: [address] [place] [address] [phone]                 The patient has been discharged on:   1.Beta Blocker:  Yes [ x  ]  No   [   ]                              If No, reason:  2.Ace Inhibitor/ARB: Yes [ X  ]                                     No  [    ]                                     If No, reason:  3.Statin:   Yes [  x ]                  No  [   ]                  If No, reason:  4.Ecasa:  Yes  [  x ]                  No   [   ]                  If No, reason:  5. ACS on Admission?  NO  P2Y12 Inhibitor:  Yes  [   ]                                No  [ x ]    Signed: Magdalene River, PA-C 08/21/2022, 12:24 PM

## 2022-08-18 NOTE — Discharge Instructions (Signed)

## 2022-08-18 NOTE — Progress Notes (Signed)
CT surgery PM rounds  Patient examined and record reviewed.Hemodynamics stable,labs satisfactory.Patient had stable day.Continue current care.  No more ventricular arrhythmias since this morning Anticipate patient will be able toProgressive care tomorrow  Blood pressure 132/76, pulse 70, temperature 99.6 F (37.6 C), temperature source Oral, resp. rate (!) 24, height 5\' 7"  (1.702 m), weight 97.2 kg, SpO2 95 %.

## 2022-08-18 NOTE — Anesthesia Postprocedure Evaluation (Signed)
Anesthesia Post Note  Patient: Antonio Patterson  Procedure(s) Performed: CORONARY ARTERY BYPASS GRAFTING (CABG) X3 BYPASSES USING OPEN LEFT INTERNAL MAMMARY ARTERY AND ENDOSCOPIC RIGHT GREATER SAPHENOUS VEIN HARVEST. (Chest) TRANSESOPHAGEAL ECHOCARDIOGRAM (TEE) (Esophagus)     Patient location during evaluation: SICU Anesthesia Type: General Level of consciousness: sedated Pain management: pain level controlled Vital Signs Assessment: post-procedure vital signs reviewed and stable Respiratory status: patient remains intubated per anesthesia plan Cardiovascular status: stable Postop Assessment: no apparent nausea or vomiting Anesthetic complications: no   No notable events documented.  Last Vitals:  Vitals:   08/18/22 0630 08/18/22 0645  BP: 128/61 133/63  Pulse: 73 73  Resp: (!) 23 (!) 24  Temp: (!) 38.2 C (!) 38.2 C  SpO2: 92% 95%    Last Pain:  Vitals:   08/18/22 0400  PainSc: 3                  Charlis Harner S

## 2022-08-18 NOTE — Progress Notes (Addendum)
T[facility] DAILY ICU PROGRESS NOTE                   [address]            [place] [address]          [phone]   1 Day Post-Op Procedure(s) (LRB): CORONARY ARTERY BYPASS GRAFTING (CABG) X3 BYPASSES USING OPEN LEFT INTERNAL MAMMARY ARTERY AND ENDOSCOPIC RIGHT GREATER SAPHENOUS VEIN HARVEST. (N/A) TRANSESOPHAGEAL ECHOCARDIOGRAM (TEE) (N/A)  Total Length of Stay:  LOS: 1 day   Subjective: Extubated by 6 PM yesterday. Awake and alert this morning, says pain control is adequate.  No new concerns. Norepinephrine and Neo-Synephrine are off. The insulin drip is paused due to hypoglycemia this morning.  Objective: Vital signs in last 24 hours: Temp:  [96.3 F (35.7 C)-100.8 F (38.2 C)] 100.8 F (38.2 C) (08/25 0645) Pulse Rate:  [66-85] 73 (08/25 0645) Resp:  [8-30] 24 (08/25 0645) BP: (86-136)/(60-89) 133/63 (08/25 0645) SpO2:  [91 %-98 %] 95 % (08/25 0645) Arterial Line BP: (60-144)/(42-67) 110/54 (08/25 0600) FiO2 (%):  [40 %-60 %] 40 % (08/24 1727) Weight:  [97.2 kg] 97.2 kg (08/25 0500)  Filed Weights   08/17/22 0553 08/18/22 0500  Weight: 92.5 kg 97.2 kg    Weight change: 4.666 kg   Hemodynamic parameters for last 24 hours: CVP:  [7 mmHg-24 mmHg] 8 mmHg  Intake/Output from previous day: 08/24 0701 - 08/25 0700 In: 5288.1 [I.V.:2744.2; Blood:520; IV Piggyback:2023.9] Out: 2830 [Urine:1890; Blood:800; Chest Tube:140]  Intake/Output this shift: No intake/output data recorded.  Current Meds: Scheduled Meds:  acetaminophen  1,000 mg Oral Q6H   aspirin EC  325 mg Oral Daily   bisacodyl  10 mg Oral Daily   Or   bisacodyl  10 mg Rectal Daily   Chlorhexidine Gluconate Cloth  6 each Topical Daily   docusate sodium  200 mg Oral Daily   metoprolol tartrate  12.5 mg Oral BID   [START ON 08/19/2022] pantoprazole  40 mg Oral Daily   simvastatin  40 mg Oral Daily   sodium chloride flush  3 mL Intravenous Q12H   tamsulosin  0.4 mg Oral Daily    Continuous Infusions:  sodium chloride Stopped (08/17/22 1831)   sodium chloride     albumin human 60 mL/hr at 08/18/22 0500    ceFAZolin (ANCEF) IV 2 g (08/18/22 0508)   dexmedetomidine (PRECEDEX) IV infusion Stopped (08/17/22 1708)   insulin 1.5 Units/hr (08/18/22 0500)   lactated ringers     lactated ringers Stopped (08/17/22 1554)   lactated ringers 20 mL/hr at 08/18/22 0500   niCARDipine Stopped (08/17/22 1340)   nitroGLYCERIN Stopped (08/17/22 1340)   norepinephrine (LEVOPHED) Adult infusion Stopped (08/18/22 0031)   phenylephrine (NEO-SYNEPHRINE) Adult infusion Stopped (08/17/22 1429)   PRN Meds:.sodium chloride, albumin human, dextrose, lactated ringers, metoprolol tartrate, midazolam, morphine injection, ondansetron (ZOFRAN) IV, oxyCODONE, sodium chloride flush, traMADol  General appearance: alert, cooperative, and no distress Neurologic: intact Heart: Stable sinus rhythm with PACs, periods of atrial bigeminy. Lungs: Breath sounds are clear, minimal chest tube output.  Chest x-ray showing some volume loss in the left base due to atelectasis and possibly small effusion. Abdomen: Firm but not tender, hypoactive bowel sounds Extremities: Well perfused, the right lower extremity EVH site is intact and dry.  Minimal bruising. Wound: Sternotomy incision is covered with a dry Aquacel dressing.  Lab Results: CBC: Recent Labs    08/17/22 1352 08/17/22 1750 08/17/22 1900 08/18/22  0319  WBC 17.5*  --   --  13.0*  HGB 10.9*   < > 10.5* 11.4*  HCT 32.7*   < > 31.0* 35.9*  PLT 119*  --   --  152   < > = values in this interval not displayed.   BMET:  Recent Labs    08/15/22 1230 08/17/22 0808 08/17/22 1254 08/17/22 1351 08/17/22 1900 08/18/22 0319  NA 139   < > 139   < > 140 136  K 3.8   < > 4.2   < > 4.2 4.6  CL 109   < > 106  --   --  110  CO2 22  --   --   --   --  21*  GLUCOSE 68*   < > 165*  --   --  149*  BUN 22   < > 19  --   --  16  CREATININE 1.06   < >  0.60*  --   --  0.86  CALCIUM 9.4  --   --   --   --  8.1*   < > = values in this interval not displayed.    CMET: Lab Results  Component Value Date   WBC 13.0 (H) 08/18/2022   HGB 11.4 (L) 08/18/2022   HCT 35.9 (L) 08/18/2022   PLT 152 08/18/2022   GLUCOSE 149 (H) 08/18/2022   CHOL 140 02/28/2019   TRIG 51 02/28/2019   HDL 51 02/28/2019   LDLCALC 79 02/28/2019   ALT 21 08/15/2022   AST 21 08/15/2022   NA 136 08/18/2022   K 4.6 08/18/2022   CL 110 08/18/2022   CREATININE 0.86 08/18/2022   BUN 16 08/18/2022   CO2 21 (L) 08/18/2022   TSH 2.410 05/26/2022   PSA 2.06 02/22/2018   INR 1.6 (H) 08/17/2022   HGBA1C 5.9 (H) 08/15/2022   MICROALBUR <0.7 02/22/2018      PT/INR:  Recent Labs    08/17/22 1352  LABPROT 18.9*  INR 1.6*   Radiology: [facility] Chest Port 1 View  Result Date: 08/17/2022 CLINICAL DATA:  Postop CABG EXAM: PORTABLE CHEST 1 VIEW COMPARISON:  08/15/2022 FINDINGS: Postop CABG. Endotracheal tube in good position. NG tube in the body of the stomach. Right jugular central venous catheter tip in the proximal SVC. Surgical drain in the mediastinum. Diffuse bilateral airspace disease. This is greater on the left than the right and has developed since the preop study. No significant effusion. No pneumothorax. IMPRESSION: Support lines in good position.  No pneumothorax Hypoventilation with bilateral airspace disease. Probable edema, left greater than right. Electronically Signed   By: Franchot Gallo M.D.   On: 08/17/2022 14:01     Assessment/Plan: S/P Procedure(s) (LRB): CORONARY ARTERY BYPASS GRAFTING (CABG) X3 BYPASSES USING OPEN LEFT INTERNAL MAMMARY ARTERY AND ENDOSCOPIC RIGHT GREATER SAPHENOUS VEIN HARVEST. (N/A) TRANSESOPHAGEAL ECHOCARDIOGRAM (TEE) (N/A)  -Postop day 1 CABG x3 for two-vessel coronary artery disease presenting with exertional dyspnea.  Normal biventricular function preoperatively.  Stable vital signs and hemodynamics since surgery.  He is not on any  vasopressor support.  Resuming his aspirin, low-dose beta-blocker, and statin.  Remove the arterial line.  Mobilize.  -ENDO-Type 2 diabetes-on insulin prior to admission.  Glucose has been well controlled with the insulin drip overnight but is currently paused due to hypoglycemia.  We will transition to Levemir and sliding scale insulin.  -HEME-Expected acute blood loss anemia-mild, monitor.  -History of benign prostatic hyperplasia-Flomax has  been resumed.  DC the Foley catheter.  -GI-mild distention and hypoactive bowel sounds.  Encouraged to advance his oral intake slowly.  He is on PPI.  -DVT prophylaxis-begin daily enoxaparin today.   Leary Roca, PA-C [phone] 08/18/2022 7:20 AM   Agree with above. Postop day 1 progression. I will keep wires today given round of V. tach. Floor likely tomorrow.  Yvone Slape Keane Scrape

## 2022-08-19 ENCOUNTER — Inpatient Hospital Stay (HOSPITAL_COMMUNITY): Payer: Medicare Other

## 2022-08-19 LAB — BASIC METABOLIC PANEL
Anion gap: 6 (ref 5–15)
BUN: 16 mg/dL (ref 8–23)
CO2: 25 mmol/L (ref 22–32)
Calcium: 8.6 mg/dL — ABNORMAL LOW (ref 8.9–10.3)
Chloride: 103 mmol/L (ref 98–111)
Creatinine, Ser: 0.85 mg/dL (ref 0.61–1.24)
GFR, Estimated: >60 mL/min (ref >60)
Glucose, Bld: 138 mg/dL — ABNORMAL HIGH (ref 70–99)
Potassium: 3.9 mmol/L (ref 3.5–5.1)
Sodium: 134 mmol/L — ABNORMAL LOW (ref 135–145)

## 2022-08-19 LAB — GLUCOSE, CAPILLARY
Glucose-Capillary: 107 mg/dL — ABNORMAL HIGH (ref 70–99)
Glucose-Capillary: 203 mg/dL — ABNORMAL HIGH (ref 70–99)
Glucose-Capillary: 156 mg/dL — ABNORMAL HIGH (ref 70–99)
Glucose-Capillary: 258 mg/dL — ABNORMAL HIGH (ref 70–99)
Glucose-Capillary: 129 mg/dL — ABNORMAL HIGH (ref 70–99)

## 2022-08-19 LAB — CBC
HCT: 33.6 % — ABNORMAL LOW (ref 39.0–52.0)
Hemoglobin: 10.9 g/dL — ABNORMAL LOW (ref 13.0–17.0)
MCH: 29.1 pg (ref 26.0–34.0)
MCHC: 32.4 g/dL (ref 30.0–36.0)
MCV: 89.8 fL (ref 80.0–100.0)
Platelets: 124 10*3/uL — ABNORMAL LOW (ref 150–400)
RBC: 3.74 MIL/uL — ABNORMAL LOW (ref 4.22–5.81)
RDW: 13.8 % (ref 11.5–15.5)
WBC: 11.6 10*3/uL — ABNORMAL HIGH (ref 4.0–10.5)
nRBC: 0 % (ref 0.0–0.2)

## 2022-08-19 MED ORDER — INSULIN ASPART 100 UNIT/ML IJ SOLN
0.0000 [IU] | Freq: Three times a day (TID) | INTRAMUSCULAR | Status: DC
  Administered 2022-08-19: 2 [IU] via SUBCUTANEOUS
  Administered 2022-08-19: 12 [IU] via SUBCUTANEOUS
  Administered 2022-08-19 – 2022-08-20 (×2): 2 [IU] via SUBCUTANEOUS
  Administered 2022-08-20 (×3): 4 [IU] via SUBCUTANEOUS
  Administered 2022-08-21: 2 [IU] via SUBCUTANEOUS
  Administered 2022-08-21: 8 [IU] via SUBCUTANEOUS

## 2022-08-19 MED ORDER — FUROSEMIDE 10 MG/ML IJ SOLN: 20.0000 mg | Freq: Two times a day (BID) | INTRAMUSCULAR | Status: DC

## 2022-08-19 MED ORDER — OXYCODONE HCL 5 MG PO TABS
5.0000 mg | ORAL_TABLET | ORAL | Status: DC | PRN
  Administered 2022-08-19 – 2022-08-21 (×5): 5 mg via ORAL
  Filled 2022-08-19 (×5): qty 1

## 2022-08-19 MED ORDER — FUROSEMIDE 10 MG/ML IJ SOLN
40.0000 mg | Freq: Every day | INTRAMUSCULAR | Status: DC
  Administered 2022-08-19 – 2022-08-20 (×2): 40 mg via INTRAVENOUS
  Filled 2022-08-19 (×2): qty 4

## 2022-08-19 MED ORDER — POTASSIUM CHLORIDE CRYS ER 20 MEQ PO TBCR
20.0000 meq | EXTENDED_RELEASE_TABLET | Freq: Every day | ORAL | Status: DC
  Administered 2022-08-19 – 2022-08-20 (×2): 20 meq via ORAL
  Filled 2022-08-19 (×2): qty 1

## 2022-08-19 NOTE — Progress Notes (Signed)
2 Days Post-Op Procedure(s) (LRB): CORONARY ARTERY BYPASS GRAFTING (CABG) X3 BYPASSES USING OPEN LEFT INTERNAL MAMMARY ARTERY AND ENDOSCOPIC RIGHT GREATER SAPHENOUS VEIN HARVEST. (N/A) TRANSESOPHAGEAL ECHOCARDIOGRAM (TEE) (N/A) Subjective: No complaints No more ventricular ectopy  Objective: Vital signs in last 24 hours: Temp:  [98 F (36.7 C)-100.6 F (38.1 C)] 98.1 F (36.7 C) (08/26 0730) Pulse Rate:  [58-79] 75 (08/26 0730) Cardiac Rhythm: Normal sinus rhythm;Sinus bradycardia (08/26 0400) Resp:  [9-36] 22 (08/26 0730) BP: (96-156)/(61-76) 126/71 (08/26 0700) SpO2:  [94 %-98 %] 95 % (08/26 0730) Arterial Line BP: (155-174)/(57-65) 174/60 (08/25 1100) Weight:  [97.1 kg] 97.1 kg (08/26 0500)  Hemodynamic parameters for last 24 hours: CVP:  [11 mmHg-12 mmHg] 12 mmHg  Intake/Output from previous day: 08/25 0701 - 08/26 0700 In: 652.1 [I.V.:452; IV Piggyback:200.1] Out: 1385 [Urine:905; Chest Tube:480] Intake/Output this shift: Total I/O In: -  Out: 200 [Urine:200]       Exam    General- alert and comfortable    Neck- no JVD, no cervical adenopathy palpable, no carotid bruit   Lungs- clear without rales, wheezes   Cor- regular rate and rhythm, no murmur , gallop   Abdomen- soft, non-tender   Extremities - warm, non-tender, minimal edema   Neuro- oriented, appropriate, no focal weakness   Lab Results: Recent Labs    08/18/22 1554 08/19/22 0342  WBC 15.4* 11.6*  HGB 11.9* 10.9*  HCT 36.5* 33.6*  PLT 147* 124*   BMET:  Recent Labs    08/18/22 1554 08/19/22 0342  NA 134* 134*  K 4.5 3.9  CL 103 103  CO2 25 25  GLUCOSE 207* 138*  BUN 15 16  CREATININE 1.00 0.85  CALCIUM 8.7* 8.6*    PT/INR:  Recent Labs    08/17/22 1352  LABPROT 18.9*  INR 1.6*   ABG    Component Value Date/Time   PHART 7.298 (L) 08/17/2022 1900   HCO3 20.0 08/17/2022 1900   TCO2 21 (L) 08/17/2022 1900   ACIDBASEDEF 6.0 (H) 08/17/2022 1900   O2SAT 97 08/17/2022 1900   CBG  (last 3)  Recent Labs    08/18/22 2343 08/19/22 0349 08/19/22 0730  GLUCAP 157* 107* 203*    Assessment/Plan: S/P Procedure(s) (LRB): CORONARY ARTERY BYPASS GRAFTING (CABG) X3 BYPASSES USING OPEN LEFT INTERNAL MAMMARY ARTERY AND ENDOSCOPIC RIGHT GREATER SAPHENOUS VEIN HARVEST. (N/A) TRANSESOPHAGEAL ECHOCARDIOGRAM (TEE) (N/A) Mobilize Diuresis d/c tubes/lines Plan for transfer to step-down: see transfer orders DC EPW tomorrow   LOS: 2 days    Antonio Patterson 08/19/2022

## 2022-08-19 NOTE — Plan of Care (Signed)

## 2022-08-19 NOTE — Progress Notes (Signed)
Patient arrived from [facility] to [phone] after CABGx3 with Dr. Cliffton Asters on 08/17/22.  Telemetry monitor applied and CCMD notified.  CHG bath and skin assessment completed.  Patient oriented to unit and room to include call light and phone.  All needs addressed and call light within reach.

## 2022-08-20 ENCOUNTER — Inpatient Hospital Stay (HOSPITAL_COMMUNITY): Payer: Medicare Other

## 2022-08-20 LAB — GLUCOSE, CAPILLARY
Glucose-Capillary: 192 mg/dL — ABNORMAL HIGH (ref 70–99)
Glucose-Capillary: 195 mg/dL — ABNORMAL HIGH (ref 70–99)
Glucose-Capillary: 178 mg/dL — ABNORMAL HIGH (ref 70–99)
Glucose-Capillary: 162 mg/dL — ABNORMAL HIGH (ref 70–99)

## 2022-08-20 LAB — CBC
HCT: 33.8 % — ABNORMAL LOW (ref 39.0–52.0)
Hemoglobin: 11.4 g/dL — ABNORMAL LOW (ref 13.0–17.0)
MCH: 29.5 pg (ref 26.0–34.0)
MCHC: 33.7 g/dL (ref 30.0–36.0)
MCV: 87.3 fL (ref 80.0–100.0)
Platelets: 157 10*3/uL (ref 150–400)
RBC: 3.87 MIL/uL — ABNORMAL LOW (ref 4.22–5.81)
RDW: 13.7 % (ref 11.5–15.5)
WBC: 12.3 10*3/uL — ABNORMAL HIGH (ref 4.0–10.5)
nRBC: 0 % (ref 0.0–0.2)

## 2022-08-20 LAB — BASIC METABOLIC PANEL
Anion gap: 8 (ref 5–15)
BUN: 14 mg/dL (ref 8–23)
CO2: 28 mmol/L (ref 22–32)
Calcium: 8.8 mg/dL — ABNORMAL LOW (ref 8.9–10.3)
Chloride: 99 mmol/L (ref 98–111)
Creatinine, Ser: 0.89 mg/dL (ref 0.61–1.24)
GFR, Estimated: >60 mL/min (ref >60)
Glucose, Bld: 156 mg/dL — ABNORMAL HIGH (ref 70–99)
Potassium: 3.7 mmol/L (ref 3.5–5.1)
Sodium: 135 mmol/L (ref 135–145)

## 2022-08-20 MED ORDER — POTASSIUM CHLORIDE CRYS ER 20 MEQ PO TBCR
20.0000 meq | EXTENDED_RELEASE_TABLET | Freq: Two times a day (BID) | ORAL | Status: DC
  Administered 2022-08-20 – 2022-08-21 (×2): 20 meq via ORAL
  Filled 2022-08-20 (×2): qty 1

## 2022-08-20 MED ORDER — LOSARTAN POTASSIUM 25 MG PO TABS
25.0000 mg | ORAL_TABLET | Freq: Every day | ORAL | Status: DC
  Administered 2022-08-20 – 2022-08-21 (×2): 25 mg via ORAL
  Filled 2022-08-20 (×2): qty 1

## 2022-08-20 NOTE — Progress Notes (Addendum)
      [address]       [place] [address]             [phone]      3 Days Post-Op Procedure(s) (LRB): CORONARY ARTERY BYPASS GRAFTING (CABG) X3 BYPASSES USING OPEN LEFT INTERNAL MAMMARY ARTERY AND ENDOSCOPIC RIGHT GREATER SAPHENOUS VEIN HARVEST. (N/A) TRANSESOPHAGEAL ECHOCARDIOGRAM (TEE) (N/A)  Subjective:  Patient doing well.  Having a little more pain overnight, however this is well controlled with pain medication.  + ambulation  + BM  Objective: Vital signs in last 24 hours: Temp:  [98.1 F (36.7 C)-98.5 F (36.9 C)] 98.5 F (36.9 C) (08/27 0732) Pulse Rate:  [74-82] 81 (08/27 1032) Cardiac Rhythm: Normal sinus rhythm (08/27 0800) Resp:  [12-24] 17 (08/27 1032) BP: (124-142)/(65-86) 126/74 (08/27 1032) SpO2:  [88 %-98 %] 95 % (08/27 1032) Weight:  [96.5 kg] 96.5 kg (08/27 0428)  Intake/Output from previous day: 08/26 0701 - 08/27 0700 In: 260.8 [P.O.:100; I.V.:70.8] Out: 1975 [Urine:1975] Intake/Output this shift: Total I/O In: -  Out: 1480 [Urine:1480]  General appearance: alert, cooperative, and no distress Heart: regular rate and rhythm Lungs: clear to auscultation bilaterally Abdomen: soft, non-tender; bowel sounds normal; no masses,  no organomegaly Extremities: edema trace Wound: clean and dry  Lab Results: Recent Labs    08/19/22 0342 08/20/22 0203  WBC 11.6* 12.3*  HGB 10.9* 11.4*  HCT 33.6* 33.8*  PLT 124* 157   BMET:  Recent Labs    08/19/22 0342 08/20/22 0203  NA 134* 135  K 3.9 3.7  CL 103 99  CO2 25 28  GLUCOSE 138* 156*  BUN 16 14  CREATININE 0.85 0.89  CALCIUM 8.6* 8.8*    PT/INR:  Recent Labs    08/17/22 1352  LABPROT 18.9*  INR 1.6*   ABG    Component Value Date/Time   PHART 7.298 (L) 08/17/2022 1900   HCO3 20.0 08/17/2022 1900   TCO2 21 (L) 08/17/2022 1900   ACIDBASEDEF 6.0 (H) 08/17/2022 1900   O2SAT 97 08/17/2022 1900   CBG (last 3)  Recent Labs    08/19/22 1441 08/19/22 2249  08/20/22 0634  GLUCAP 258* 129* 192*    Assessment/Plan: S/P Procedure(s) (LRB): CORONARY ARTERY BYPASS GRAFTING (CABG) X3 BYPASSES USING OPEN LEFT INTERNAL MAMMARY ARTERY AND ENDOSCOPIC RIGHT GREATER SAPHENOUS VEIN HARVEST. (N/A) TRANSESOPHAGEAL ECHOCARDIOGRAM (TEE) (N/A)  CV- NSR, + HTN- continue Lopressor at home dose, will resume home Cozaar at reduced dose, d/c EPW today Pulm- wean oxygen as tolerated Renal- creatinine remains stable, patient on IV lasix with good U/O, weight is trending down, will transition to oral Lasix tomorrow  Hypokalemia- borderline due to IV diuretics, will increase supplement today DM- sugars stable continue current regimen Dispo- patient stable, in NSR, weaning oxygen as tolerated, responding well to IV diuretics, possibly for d/c in next 24-48 hours pending progress   LOS: 3 days    Lowella Dandy, PA-C 08/20/2022  patient examined and medical record reviewed,agree with above note. Lovett Sox 08/20/2022

## 2022-08-20 NOTE — Progress Notes (Signed)
Patient ambulated in hallway with nursing staff. 250 feet gait slightly unsteady. Back in chair call bell within reach. Saina Waage, Randall An rN

## 2022-08-21 LAB — CBC
HCT: 35.8 % — ABNORMAL LOW (ref 39.0–52.0)
Hemoglobin: 12 g/dL — ABNORMAL LOW (ref 13.0–17.0)
MCH: 28.9 pg (ref 26.0–34.0)
MCHC: 33.5 g/dL (ref 30.0–36.0)
MCV: 86.3 fL (ref 80.0–100.0)
Platelets: 192 10*3/uL (ref 150–400)
RBC: 4.15 MIL/uL — ABNORMAL LOW (ref 4.22–5.81)
RDW: 13.6 % (ref 11.5–15.5)
WBC: 9.9 10*3/uL (ref 4.0–10.5)
nRBC: 0 % (ref 0.0–0.2)

## 2022-08-21 LAB — GLUCOSE, CAPILLARY
Glucose-Capillary: 137 mg/dL — ABNORMAL HIGH (ref 70–99)
Glucose-Capillary: 217 mg/dL — ABNORMAL HIGH (ref 70–99)

## 2022-08-21 LAB — BASIC METABOLIC PANEL
Anion gap: 9 (ref 5–15)
BUN: 15 mg/dL (ref 8–23)
CO2: 27 mmol/L (ref 22–32)
Calcium: 9 mg/dL (ref 8.9–10.3)
Chloride: 103 mmol/L (ref 98–111)
Creatinine, Ser: 0.98 mg/dL (ref 0.61–1.24)
GFR, Estimated: >60 mL/min (ref >60)
Glucose, Bld: 146 mg/dL — ABNORMAL HIGH (ref 70–99)
Potassium: 4 mmol/L (ref 3.5–5.1)
Sodium: 139 mmol/L (ref 135–145)

## 2022-08-21 MED ORDER — TRAMADOL HCL 50 MG PO TABS: 50.0000 mg | ORAL_TABLET | Freq: Four times a day (QID) | ORAL | 0 refills | Status: DC | PRN

## 2022-08-21 MED ORDER — LOSARTAN POTASSIUM 25 MG PO TABS: 25.0000 mg | ORAL_TABLET | Freq: Every day | ORAL | 1 refills | Status: DC

## 2022-08-21 MED ORDER — ASPIRIN 325 MG PO TBEC: 325.0000 mg | DELAYED_RELEASE_TABLET | Freq: Every day | ORAL | Status: DC

## 2022-08-21 NOTE — Progress Notes (Signed)
4 Days Post-Op Procedure(s) (LRB): CORONARY ARTERY BYPASS GRAFTING (CABG) X3 BYPASSES USING OPEN LEFT INTERNAL MAMMARY ARTERY AND ENDOSCOPIC RIGHT GREATER SAPHENOUS VEIN HARVEST. (N/A) TRANSESOPHAGEAL ECHOCARDIOGRAM (TEE) (N/A) Subjective: Patient states he had a coughing spell this morning but otherwise is feeling well  Objective: Vital signs in last 24 hours: Temp:  [98.2 F (36.8 C)-98.5 F (36.9 C)] 98.5 F (36.9 C) (08/28 0010) Pulse Rate:  [76-83] 76 (08/28 0010) Cardiac Rhythm: Normal sinus rhythm (08/27 1959) Resp:  [14-20] 20 (08/28 0500) BP: (95-136)/(64-79) 119/65 (08/28 0010) SpO2:  [91 %-96 %] 91 % (08/28 0010) Weight:  [92.3 kg] 92.3 kg (08/28 0500)  Hemodynamic parameters for last 24 hours:    Intake/Output from previous day: 08/27 0701 - 08/28 0700 In: -  Out: 2580 [Urine:2580] Intake/Output this shift: No intake/output data recorded.  General appearance: alert, cooperative, and no distress Neurologic: intact Heart: NSR with some ventricular ectopy  Lungs: Minimally diminished breath sounds bibasilar Abdomen: soft, non-tender; bowel sounds normal; no masses,  no organomegaly Extremities: extremities normal, atraumatic, no cyanosis or edema Wound: Clean and dry  Lab Results: Recent Labs    08/20/22 0203 08/21/22 0611  WBC 12.3* 9.9  HGB 11.4* 12.0*  HCT 33.8* 35.8*  PLT 157 192   BMET:  Recent Labs    08/19/22 0342 08/20/22 0203  NA 134* 135  K 3.9 3.7  CL 103 99  CO2 25 28  GLUCOSE 138* 156*  BUN 16 14  CREATININE 0.85 0.89  CALCIUM 8.6* 8.8*    PT/INR: No results for input(s): "LABPROT", "INR" in the last 72 hours. ABG    Component Value Date/Time   PHART 7.298 (L) 08/17/2022 1900   HCO3 20.0 08/17/2022 1900   TCO2 21 (L) 08/17/2022 1900   ACIDBASEDEF 6.0 (H) 08/17/2022 1900   O2SAT 97 08/17/2022 1900   CBG (last 3)  Recent Labs    08/20/22 1622 08/20/22 2159 08/21/22 0538  GLUCAP 178* 162* 137*    Assessment/Plan: S/P  Procedure(s) (LRB): CORONARY ARTERY BYPASS GRAFTING (CABG) X3 BYPASSES USING OPEN LEFT INTERNAL MAMMARY ARTERY AND ENDOSCOPIC RIGHT GREATER SAPHENOUS VEIN HARVEST. (N/A) TRANSESOPHAGEAL ECHOCARDIOGRAM (TEE) (N/A)  CV: NSR this morning with rate in 70s, minimal ventricular ectopy. SBP 107-119. On Lopressor 12.5mg  and Cozaar 25mg .   Pulm: CXR yesterday shows stable bilateral pleural effusions. Satting at 91% on RA. Walked once yesterday. Continue ambulation and IS.  GI: Good appetite, passing gas. No nausea. -BM   Endo: Diabetic controlled on insulin regimen. CBG 178/162/137  Renal: Cr 0.89. Under preop weight will hold Lasix.  ID: Wounds are clean and dry, no sign of infection.  Dispo: D/C to home today if passes a bowel movement    LOS: 4 days    , PA-C 08/21/2022

## 2022-08-21 NOTE — Care Management Important Message (Signed)
Important Message  Patient Details  Name: Antonio Patterson MRN: [id number] Date of Birth: 1951-12-22   Medicare Important Message Given:  Yes     Renie Ora 08/21/2022, 9:38 AM

## 2022-08-21 NOTE — Progress Notes (Signed)
Patient discharging home with support from wife and daughter. IV removed without complications. Tele removed and CCMD notified. Discharge instructions given and and medication administration discussed. All questions answered.  Antonio Patterson

## 2022-08-21 NOTE — Progress Notes (Signed)
CARDIAC REHAB PHASE I  PRE   Rate/Rhythm: 86/NSR    BP: sitting 125/81    SaO2: 94 % RA  MODE:  Ambulation: 470 ft   POST:  Rate/Rhythm: 91/NSR    BP: sitting 136/72     SaO2: 92  Patient ambulated 470 feet with standby by assist, denies pain, SOB, or dizziness, vss. Phase 1 education completed, patient educated on IS, sternal precautions, incision care, exercise guidelines, nutrition. Patient referred to CR phase 11 [place].  [id number] Newell Coral RN 08/21/2022 [phone]

## 2022-08-22 MED FILL — Sodium Chloride Irrigation Soln 0.9%: Qty: 6000 | Status: AC

## 2022-08-28 NOTE — Progress Notes (Signed)
Cardiology Office Note:    Date:  09/08/2022   ID:  [name], DOB [birth year], MRN [id number]  PCP:  Rhea Bleacher, NP  Cardiologist:  Evalina Field, MD  Electrophysiologist:  None   Referring MD: Rhea Bleacher, NP   Chief Complaint: hospital follow-up after CABG  History of Present Illness:    Antonio Patterson is a 71 y.o. male with a history of CAD s/p CABG x3 (LIMA o LAD, SVG to PDA, SVG to OM) on 08/17/2022, paroxysmal SVT, hypertension, hyperlipidemia, and type 2 diabetes mellitus who is followed by Dr. Audie Box and presents today for hospital follow-up after CABG.  Patient was referred to Dr. Audie Box in 05/2021 for further evaluation of dyspnea on exertion and abnormal Echo. Prior Echo at [facility] reportedly showed moderate pulmonary hypertension. Coronary CTA was ordered to rule out dyspnea as an anginal equivalent and showed coronary calcium score of 6,107 (99th percentile for age) and severe diffuse coronary calcifications with FFR showing a hemodynamically significant lesion in the the distal OM1. He subsequently had  R/LHC on 07/14/2022 which showed significant 3 vessel CAD including moderate distal left main disease as well as severe proximal RCA and proximal LCX disease. Also showed mild to moderate pulmonary hypertension with normal right and left heart filling pressures. Echo on 07/14/2022 showed LVEF of 60-65% with normal wall motion and normal diastolic parameters. Patient was referred to CT Surgery who recommended CABG. Of note, pre-CABG dopplers showed mild carotid stenosis (1-39% stenosis) bilaterally and right lower ABI indicative of mild right lower extremity arterial disease. Patient presented to [facility] on 08/17/2022 and underwent CABG x3 (LIMA to LAD, SVG to PDA, and SVG to OM) with Dr. Kipp Brood. Post-op course was unremarkable. He did require IV Lasix for volume overload after surgery but was not felt to need any PO Lasix at discharge. Home  antihypertensives were adjusted - Amlodipine and HCTZ were stopped and Losartan was reduced. He was discharged on 08/21/2022.  He presented to the ED on 08/31/2022 from PCP's office for SVT after developing sudden onset of palpitations that he described as heart racing. He was found to be tachycardic int he 180s at PCP office and EMS was called. EMS gave 6mg  of Adenosine en route to the ED and patient converted to normal sinus rhythm. He was completely asymptomatic following this and had no recurrent SVT. EKG showed no acute ischemic changes. High-sensitivity troponin minimally elevated and flat at 28 >> 33 consistent with demand ischemia. Home Toprol-XL was increased to 50mg  daily and he was discharge.  Patient presents today for follow-up. Overall patient is doing well. No recurrent palpitations since ED visit. No lightheadedness, dizziness, or syncope. He notes some mild shortness of breath intermittently both at rest and with exertion which he describes as being on the "verge of shortness of breath" but no orthopnea, PND, or lower extremity edema. He denies any chest pain; however, he has never had any chest pain (dyspnea on exertion was his anginal equivalent). He does report that his O2 sat on pulse oximeter has been a little low in the low 90s. He states it was initially 88% in the office today but then CMA put pulse oximeter on a different finger and he "changed his breathing" and it quickly improved to 95%. I did ambulate with the patient around the office and O2 sats very brief dropped to 89% for a couple of seconds and then returned to 97%. He did well with  ambulating. I do not think the 89% was an accurate reading.  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   UNSPECIFIED PERIPHERAL VASCULAR DISEASE 11/13/2008    Past Surgical History:  Procedure Laterality Date   CORONARY ARTERY BYPASS GRAFT N/A 08/17/2022   Procedure: CORONARY ARTERY BYPASS GRAFTING (CABG) X3 BYPASSES USING OPEN LEFT INTERNAL MAMMARY ARTERY AND ENDOSCOPIC RIGHT GREATER SAPHENOUS VEIN HARVEST.;  Surgeon: Lajuana Matte, MD;  Location: [facility];  Service: Open Heart Surgery;  Laterality: N/A;   INTRAVASCULAR PRESSURE WIRE/FFR STUDY N/A 07/14/2022   Procedure: INTRAVASCULAR PRESSURE WIRE/FFR STUDY;  Surgeon: Nelva Bush, MD;  Location: [facility];  Service: Cardiovascular;  Laterality: N/A;   RIGHT/LEFT HEART CATH AND CORONARY ANGIOGRAPHY N/A 07/14/2022   Procedure: RIGHT/LEFT HEART CATH AND CORONARY ANGIOGRAPHY;  Surgeon: Nelva Bush, MD;  Location: [facility];  Service: Cardiovascular;  Laterality: N/A;   TEE WITHOUT CARDIOVERSION N/A 08/17/2022   Procedure: TRANSESOPHAGEAL ECHOCARDIOGRAM (TEE);  Surgeon: Lajuana Matte, MD;  Location: [facility];  Service: Open Heart Surgery;  Laterality: N/A;   TURBINATE REDUCTION Bilateral 2017   VASECTOMY  1976    Current Medications: Current Meds  Medication Sig   acetaminophen (TYLENOL) 650 MG CR tablet Take 1,300 mg by mouth every 8 (eight) hours as needed for pain.   aspirin EC 325 MG tablet Take 1 tablet (325 mg total) by mouth daily.   glucose blood test strip 1 each by Other route in the morning, at noon, in the evening, and at bedtime.    insulin aspart (NOVOLOG FLEXPEN RELION) 100 UNIT/ML FlexPen Inject 5-40 Units into the skin See admin instructions. 3-5 times per day, and pen needles 1/day   losartan (COZAAR) 25 MG tablet Take 1 tablet (25 mg total) by mouth daily.   metoprolol succinate (TOPROL-XL) 50 MG  24 hr tablet Take 50 mg by mouth daily. Take with or immediately following a meal.   omeprazole (PRILOSEC) 40 MG capsule Take 40 mg by mouth daily.   oxymetazoline (AFRIN) 0.05 % nasal spray Place 1 spray into both nostrils 2 (two) times daily.   simvastatin (ZOCOR) 40 MG tablet Take 40 mg by mouth daily.   tamsulosin (FLOMAX) 0.4 MG CAPS capsule Take 1 capsule (0.4 mg total) by mouth daily.   traMADol (ULTRAM) 50 MG tablet Take 1 tablet (50 mg total) by mouth every 6 (six) hours as needed for moderate pain.   Vitamin D, Ergocalciferol, (DRISDOL) 1.25 MG (50000 UT) CAPS capsule Take one tablet wkly     Allergies:   Lipitor [atorvastatin], Metformin, Rosuvastatin, Sulfa antibiotics, and Sulfonamide derivatives   Social History   Socioeconomic History   Marital status: Married    Spouse name: Not on file   Number of children: 1   Years of education: Not on file   Highest education level: Not on file  Occupational History    Employer: [employer]    Comment: Works for [employer]   Occupation: Neurosurgeon  Tobacco Use  Smoking status: Former    Packs/day: 1.00    Years: 10.00    Total pack years: 10.00    Types: Cigarettes    Quit date: 1971    Years since quitting: 52.7   Smokeless tobacco: Never  Vaping Use   Vaping Use: Never used  Substance and Sexual Activity   Alcohol use: Yes    Comment: maybe one drink per week   Drug use: Never   Sexual activity: Not Currently  Other Topics Concern   Not on file  Social History Narrative   Had 2 children. 1 is deceased   Social Determinants of Radio broadcast assistant Strain: Not on file  Food Insecurity: Not on file  Transportation Needs: Not on file  Physical Activity: Not on file  Stress: Not on file  Social Connections: Not on file     Family History: The patient's family history includes Cancer in his maternal grandmother and paternal grandfather; Heart disease in his father;  Stroke in his maternal grandfather.  ROS:   Please see the history of present illness.     EKGs/Labs/Other Studies Reviewed:    The following studies were reviewed today:  Coronary CTA 06/13/2022: Impressions: 1. Coronary calcium score of 6107. This was 99th percentile for age and sex matched control. 2.  Normal coronary origin with right dominance. 3. Severe diffuse coronary calcifications. Can overestimate degree of coronary stenosis due to blooming artifact from severe calcifications. 4. Calcified plaque in the proximal RCA causes moderate (50-69%) stenosis. Calcified plaque in the mid RCA causes mild (25-49%) stenosis. Calcified plaque in the distal RCA causes moderate (50-69%) stenosis. 5. Calcified plaque in the proximal LAD causes mild (25-49%) stenosis. Calcified plaque in the mid LAD causes mild (25-49%) stenosis. Calcified plaque in the distal LAD causes moderate (50-69%) stenosis. Calcified plaque in D1 causes mild (25-49%) stenosis. 6. Calcified plaque in the proximal LCX causes mild (25-49%) stenosis. Calcified plaque in the mid LCX causes mild (25-49%) stenosis. Calcified plaque in the distal LCX causes moderate (50-69%) stenosis. Calcified plaque in proximal OM1 causes moderate (50-69%) stenosis. Calcified plaque in distal OM1 causes moderate (50-69%) stenosis. 7. Calcified plaque in the left main causes minimal (0-24%) stenosis. 8.  Mild aortic valve calcifications (AV calcium score 161)  FFR Summary: CTFFR suggests only hemodynamically significant lesion is in distal OM1. Given small vessel size at this location, medical management recommended _______________  Right/ Left Cardiac Catheterization 07/14/2022: Conclusions: Significant three-vessel coronary artery disease including moderate distal LMCA and moderate diffuse proximal/mid LAD disease that is hemodynamically significant (RFR = 0.84), as well as 90% ostial LCx and 95% proximal RCA lesions with heavy  calcification. Normal left ventricular systolic function. Mild-moderate pulmonary hypertension (mean PA 26 mmHg, PA SBP 50 mmHg, PVR 2.7 WU). Normal right and left heart filling pressures (PCWP 10 mmHg, LVEDP 12 mmHg, mean RA 4 mmHg). Normal Fick cardiac output/index (CO 6.4 L/min, CI 3.1 L/min/m^2).   Recommendations: Obtain echocardiogram to exclude concurrent significant valvular heart disease and assess RV size/function. Outpatient cardiac surgery consultation for CABG. Start metoprolol succinate 25 mg daily for antianginal therapy. Aggressive secondary prevention of coronary artery disease.  Diagnostic Dominance: Right   _______________  Echocardiogram 07/14/2022: Impressions:  1. Left ventricular ejection fraction, by estimation, is 60 to 65%. The  left ventricle has normal function. The left ventricle has no regional  wall motion abnormalities. Left ventricular diastolic parameters were  normal.   2. Right ventricular systolic function is normal. The right  ventricular  size is normal.   3. The mitral valve is abnormal. No evidence of mitral valve  regurgitation. No evidence of mitral stenosis.   4. The aortic valve is tricuspid. There is mild calcification of the  aortic valve. There is mild thickening of the aortic valve. Aortic valve  regurgitation is not visualized. Aortic valve sclerosis is present, with  no evidence of aortic valve stenosis.   5. The inferior vena cava is normal in size with greater than 50%  respiratory variability, suggesting right atrial pressure of 3 mmHg.  _______________  Pre-CABG Dopplers 08/15/2022: Summary: Right Carotid: Velocities in the right ICA are consistent with a 1-39%  stenosis.   Left Carotid: Velocities in the left ICA are consistent with a 1-39%  stenosis.  Vertebrals: Bilateral vertebral arteries demonstrate antegrade flow.   Right ABI: Resting right ankle-brachial index indicates mild right lower  extremity arterial  disease. The right toe-brachial index is normal.  Left ABI: Resting left ankle-brachial index is within normal range. The  left toe-brachial index is normal.  Right Upper Extremity: Doppler waveforms remain within normal limits with  right radial compression. Doppler waveforms decrease >50% with right ulnar  compression.  Left Upper Extremity: Doppler waveforms remain within normal limits with  left radial compression. Doppler waveforms remain within normal limits  with left ulnar compression.   EKG:  EKG  ordered today. EKG personally reviewed and demonstrates normal sinus rhythm, rate 63 bpm, with non-specific ST/T changes. QTc 405 ms.  Recent Labs: 05/26/2022: BNP 15.6; TSH 2.410 08/18/2022: Magnesium 2.2 08/31/2022: ALT 17; BUN 16; Creatinine, Ser 1.17; Hemoglobin 12.4; Platelets 423; Potassium 4.1; Sodium 135  Recent Lipid Panel    Component Value Date/Time   CHOL 140 02/28/2019 0914   TRIG 51 02/28/2019 0914   HDL 51 02/28/2019 0914   CHOLHDL 2.7 02/28/2019 0914   CHOLHDL 3 02/22/2018 1134   VLDL 7.6 02/22/2018 1134   LDLCALC 79 02/28/2019 0914    Physical Exam:    Vital Signs: BP 120/70   Pulse 63   Ht 5\' 7"  (1.702 m)   Wt 201 lb (91.2 kg)   SpO2 95%   BMI 31.48 kg/m     Wt Readings from Last 3 Encounters:  09/08/22 201 lb (91.2 kg)  08/31/22 202 lb (91.6 kg)  08/21/22 203 lb 7.8 oz (92.3 kg)     General: 71 y.o. Caucasian male in no acute distress. HEENT: Normocephalic and atraumatic. Sclera clear.  Neck: Supple. No carotid bruits. No JVD. Heart: RRR. Distinct S1 and S2. No murmurs, gallops, or rubs. Radial pulses 2+ and equal bilaterally. Sternotomy and chest tube incisions healing well. Lungs: No increased work of breathing. Very lightly diminished breath sounds in left base. Otherwise, lungs clear to ausculation bilaterally.  Abdomen: Soft, non-distended, and non-tender to palpation.  Extremities: No lower extremity edema.    Skin: Warm and dry. Neuro: Alert  and oriented x3. No focal deficits. Psych: Normal affect. Responds appropriately.  Assessment:    1. Coronary artery disease involving native coronary artery of native heart without angina pectoris   2. S/P CABG (coronary artery bypass graft)   3. Primary hypertension   4. Hyperlipidemia, unspecified hyperlipidemia type   5. Type 2 diabetes mellitus with complication, with long-term current use of insulin (HCC)   6. SVT (supraventricular tachycardia) (HCC)   7. Shortness of breath     Plan:    CAD s/p CABG S/p recent CABG x3 with LIMA to LAD, SVG  to PDA, and SVG to OM1 on 08/17/2022. - Overall recovering well. No chest pain. He reports some very mild shortness of breath and his O2 sat being in the low 90% (although 95-98% in the office today). Chest x-ray last week in the ED showed small left pleural effusion with underlying opacities. He does have very slightly diminished breath sounds on that side. Will check BNP. If markedly elevated, will start Lasix 40mg  daily for 3 days. - Continue Aspirin. On 325mg  daily for now after CABG. Can likely decrease to 81mg  daily in a couple of months.  - Continue beta-blocker and statin.  Paroxysmal SVT Recently seen in the ED last week for paroxysmal SVT. Converted to sinus rhythm after receiving Adenosine in the field (I am unable to personally review the EMS strip to confirm SVT). - No recurrent palpitations. - Continue Toprol-XL 50mg  daily.  Hypertension BP well controlled.  - Continue current medications: Losartan 25mg  daily and Toprol-XL 50mg  daily.  Hyperlipidemia Last lipid panel in 2020 showed LDL of 79. Goal <70 given CAD. - Intolerant to high-intensity statins in the past. On Simvastatin 40mg  daily. - Will repeat lipid panel. LFTs normal on CMET last week. If LDL above goal, will need to add Zetia or consider PCSK9 inhibitor.  Type 2 Diabetes Mellitus Hemoglobin A1c 5.9% in 07/2022. - On Insulin. - Management per PCP.  Disposition:  Follow up in 3 months.   Medication Adjustments/Labs and Tests Ordered: Current medicines are reviewed at length with the patient today.  Concerns regarding medicines are outlined above.  Orders Placed This Encounter  Procedures   B Nat Peptide   Lipid panel   EKG 12-Lead   No orders of the defined types were placed in this encounter.   Patient Instructions  Medication Instructions:  Your physician recommends that you continue on your current medications as directed. Please refer to the Current Medication list given to you today.  *If you need a refill on your cardiac medications before your next appointment, please call your pharmacy*   Lab Work: Your physician recommends that you have the following labs drawn today: BNP and Lipids.  If you have labs (blood work) drawn today and your tests are completely normal, you will receive your results only by: MyChart Message (if you have MyChart) OR A paper copy in the mail If you have any lab test that is abnormal or we need to change your treatment, we will call you to review the results.   Testing/Procedures: NONE ordered at this time of appointment   Follow-Up: At [facility], you and your health needs are our priority.  As part of our continuing mission to provide you with exceptional heart care, we have created designated Provider Care Teams.  These Care Teams include your primary Cardiologist (physician) and Advanced Practice Providers (APPs -  Physician Assistants and Nurse Practitioners) who all work together to provide you with the care you need, when you need it.  We recommend signing up for the patient portal called "MyChart".  Sign up information is provided on this After Visit Summary.  MyChart is used to connect with patients for Virtual Visits (Telemedicine).  Patients are able to view lab/test results, encounter notes, upcoming appointments, etc.  Non-urgent messages can be sent to your provider as well.   To  learn more about what you can do with MyChart, go to .    Please keep your upcoming appointment with Dr. .   Signed, , PA-C  09/08/2022 7:17 PM    [facility]

## 2022-08-31 ENCOUNTER — Encounter (HOSPITAL_COMMUNITY): Payer: Self-pay

## 2022-08-31 ENCOUNTER — Emergency Department (HOSPITAL_COMMUNITY): Payer: Medicare Other

## 2022-08-31 ENCOUNTER — Other Ambulatory Visit: Payer: Self-pay

## 2022-08-31 ENCOUNTER — Emergency Department (HOSPITAL_COMMUNITY)
Admission: EM | Admit: 2022-08-31 | Discharge: 2022-08-31 | Disposition: A | Discharge status: Home / Self Care | Payer: Medicare Other | Attending: Emergency Medicine | Admitting: Emergency Medicine

## 2022-08-31 DIAGNOSIS — Z951 Presence of aortocoronary bypass graft: Secondary | ICD-10-CM | POA: Insufficient documentation

## 2022-08-31 DIAGNOSIS — R000 Tachycardia, unspecified: Secondary | ICD-10-CM | POA: Diagnosis present

## 2022-08-31 DIAGNOSIS — I10 Essential (primary) hypertension: Secondary | ICD-10-CM | POA: Insufficient documentation

## 2022-08-31 DIAGNOSIS — Z79899 Other long term (current) drug therapy: Secondary | ICD-10-CM | POA: Insufficient documentation

## 2022-08-31 DIAGNOSIS — I2510 Atherosclerotic heart disease of native coronary artery without angina pectoris: Secondary | ICD-10-CM | POA: Insufficient documentation

## 2022-08-31 DIAGNOSIS — I471 Supraventricular tachycardia: Secondary | ICD-10-CM | POA: Insufficient documentation

## 2022-08-31 DIAGNOSIS — Z794 Long term (current) use of insulin: Secondary | ICD-10-CM | POA: Diagnosis not present

## 2022-08-31 DIAGNOSIS — Z7982 Long term (current) use of aspirin: Secondary | ICD-10-CM | POA: Diagnosis not present

## 2022-08-31 LAB — CBC
HCT: 38.2 % — ABNORMAL LOW (ref 39.0–52.0)
Hemoglobin: 12.4 g/dL — ABNORMAL LOW (ref 13.0–17.0)
MCH: 28.6 pg (ref 26.0–34.0)
MCHC: 32.5 g/dL (ref 30.0–36.0)
MCV: 88.2 fL (ref 80.0–100.0)
Platelets: 423 10*3/uL — ABNORMAL HIGH (ref 150–400)
RBC: 4.33 MIL/uL (ref 4.22–5.81)
RDW: 13.9 % (ref 11.5–15.5)
WBC: 8.9 10*3/uL (ref 4.0–10.5)
nRBC: 0 % (ref 0.0–0.2)

## 2022-08-31 LAB — COMPREHENSIVE METABOLIC PANEL
ALT: 17 U/L (ref 0–44)
AST: 20 U/L (ref 15–41)
Albumin: 3.3 g/dL — ABNORMAL LOW (ref 3.5–5.0)
Alkaline Phosphatase: 68 U/L (ref 38–126)
Anion gap: 8 (ref 5–15)
BUN: 16 mg/dL (ref 8–23)
CO2: 24 mmol/L (ref 22–32)
Calcium: 9.4 mg/dL (ref 8.9–10.3)
Chloride: 103 mmol/L (ref 98–111)
Creatinine, Ser: 1.17 mg/dL (ref 0.61–1.24)
GFR, Estimated: >60 mL/min (ref >60)
Glucose, Bld: 216 mg/dL — ABNORMAL HIGH (ref 70–99)
Potassium: 4.1 mmol/L (ref 3.5–5.1)
Sodium: 135 mmol/L (ref 135–145)
Total Bilirubin: 1.1 mg/dL (ref 0.3–1.2)
Total Protein: 6.5 g/dL (ref 6.5–8.1)

## 2022-08-31 LAB — TROPONIN I (HIGH SENSITIVITY)
Troponin I (High Sensitivity): 28 ng/L — ABNORMAL HIGH (ref <18)
Troponin I (High Sensitivity): 33 ng/L — ABNORMAL HIGH (ref <18)

## 2022-08-31 NOTE — ED Provider Notes (Signed)
[facility] EMERGENCY DEPARTMENT Provider Note   CSN: [id number] Arrival date & time: 08/31/22  1253     History  Chief Complaint  Patient presents with   Tachycardia    Antonio Patterson is a 71 y.o. male.  Patient with history of CAD, hypertension, hyperlipidemia presents today with complaints of tachycardia. He states that on 8/24 he had a CABG without complication for his CAD. States that he has been in good health since then until this morning around 9:30 am when he suddenly felt like his heart was racing. He went to his PCP who found his heart rate was in the 180s and called EMS. EMS then gave him 6 mg of adenosine and he converted without difficulty and his heart rate has continued to be normal since. He was then transported here for evaluation. He states that since he was given the adenosine he has been completely asymptomatic without fevers, chills, chest pain, shortness of breath, nausea, vomiting, or diarrhea.   The history is provided by the patient. No language interpreter was used.       Home Medications Prior to Admission medications   Medication Sig Start Date End Date Taking? Authorizing Provider  acetaminophen (TYLENOL) 650 MG CR tablet Take 1,300 mg by mouth every 8 (eight) hours as needed for pain.    [provider]  aspirin EC 325 MG tablet Take 1 tablet (325 mg total) by mouth daily. 08/22/22   Stehler, Mel Almond C, PA-C  glucose blood test strip 1 each by Other route in the morning, at noon, in the evening, and at bedtime.     [provider]  insulin aspart (NOVOLOG FLEXPEN RELION) 100 UNIT/ML FlexPen Inject 5-40 Units into the skin See admin instructions. 3-5 times per day, and pen needles 1/day 01/26/21   Renato Shin, MD  losartan (COZAAR) 25 MG tablet Take 1 tablet (25 mg total) by mouth daily. 08/22/22   Stehler, Pricilla Larsson, PA-C  metoprolol succinate (TOPROL XL) 25 MG 24 hr tablet Take 1 tablet (25 mg total) by mouth daily.  07/14/22 07/14/23  End, Harrell Gave, MD  omeprazole (PRILOSEC) 40 MG capsule Take 40 mg by mouth daily.    [provider]  oxymetazoline (AFRIN) 0.05 % nasal spray Place 1 spray into both nostrils 2 (two) times daily.    [provider]  simvastatin (ZOCOR) 40 MG tablet Take 40 mg by mouth daily.    [provider]  tamsulosin (FLOMAX) 0.4 MG CAPS capsule Take 1 capsule (0.4 mg total) by mouth daily. 02/28/19   Opalski, Neoma Laming, DO  traMADol (ULTRAM) 50 MG tablet Take 1 tablet (50 mg total) by mouth every 6 (six) hours as needed for moderate pain. 08/21/22   Stehler, Pricilla Larsson, PA-C  Vitamin D, Ergocalciferol, (DRISDOL) 1.25 MG (50000 UT) CAPS capsule Take one tablet wkly 03/07/19   Opalski, Deborah, DO      Allergies    Lipitor [atorvastatin], Metformin, Rosuvastatin, Sulfa antibiotics, and Sulfonamide derivatives    Review of Systems   Review of Systems  All other systems reviewed and are negative.   Physical Exam Updated Vital Signs BP 126/77   Pulse 73   Temp 98 F (36.7 C) (Oral)   Resp 11   Ht 5\' 7"  (1.702 m)   Wt 91.6 kg   SpO2 95%   BMI 31.64 kg/m  Physical Exam Vitals and nursing note reviewed.  Constitutional:      General: He is not in acute  distress.    Appearance: Normal appearance. He is normal weight. He is not ill-appearing, toxic-appearing or diaphoretic.  HENT:     Head: Normocephalic and atraumatic.  Cardiovascular:     Rate and Rhythm: Normal rate and regular rhythm.     Pulses: Normal pulses.          Radial pulses are 2+ on the right side and 2+ on the left side.     Heart sounds: Normal heart sounds.     Comments: Well healing midline surgical scar noted to the mid-chest without erythema, fluctuance, or induration Pulmonary:     Effort: Pulmonary effort is normal. No respiratory distress.     Breath sounds: Normal breath sounds.  Abdominal:     General: Abdomen is flat.     Palpations: Abdomen is soft.  Musculoskeletal:         General: No tenderness. Normal range of motion.     Cervical back: Normal range of motion.     Right lower leg: No edema.     Left lower leg: No edema.  Skin:    General: Skin is warm and dry.  Neurological:     General: No focal deficit present.     Mental Status: He is alert.  Psychiatric:        Mood and Affect: Mood normal.        Behavior: Behavior normal.     ED Results / Procedures / Treatments   Labs (all labs ordered are listed, but only abnormal results are displayed) Labs Reviewed  CBC - Abnormal; Notable for the following components:      Result Value   Hemoglobin 12.4 (*)    HCT 38.2 (*)    Platelets 423 (*)    All other components within normal limits  COMPREHENSIVE METABOLIC PANEL - Abnormal; Notable for the following components:   Glucose, Bld 216 (*)    Albumin 3.3 (*)    All other components within normal limits  TROPONIN I (HIGH SENSITIVITY) - Abnormal; Notable for the following components:   Troponin I (High Sensitivity) 28 (*)    All other components within normal limits  TROPONIN I (HIGH SENSITIVITY)    EKG None  Radiology DG Chest Portable 1 View  Result Date: 08/31/2022 CLINICAL DATA:  Status post CABG procedure. EXAM: PORTABLE CHEST 1 VIEW COMPARISON:  Chest radiograph 08/20/2022 FINDINGS: Monitoring leads overlie the patient. Stable cardiac and mediastinal contours. Low lung volumes. Heterogeneous opacities left lung base. Probable small left pleural effusion. No pneumothorax. IMPRESSION: Small left pleural effusion with underlying opacities which may represent atelectasis. Electronically Signed   By: Annia Belt M.D.   On: 08/31/2022 14:08    Procedures Procedures    Medications Ordered in ED Medications - No data to display  ED Course/ Medical Decision Making/ A&P                           Medical Decision Making Amount and/or Complexity of Data Reviewed Labs: ordered. Radiology: ordered.   This patient presents to the ED for  concern of tachycardia, this involves an extensive number of treatment options, and is a complaint that carries with it a high risk of complications and morbidity.   Co morbidities that complicate the patient evaluation  Hx hypertension, hyperlipidemia, and CAD   Additional history obtained:  Additional history obtained from epic chart review   Lab Tests:  I Ordered, and personally interpreted labs.  The  pertinent results include:  Troponin 28 --> 33.  No other acute laboratory findings.   Imaging Studies ordered:  I ordered imaging studies including CXR  I independently visualized and interpreted imaging which showed  Small left pleural effusion with underlying opacities which may represent atelectasis. I agree with the radiologist interpretation   Cardiac Monitoring: / EKG:  The patient was maintained on a cardiac monitor.  I personally viewed and interpreted the cardiac monitored which showed an underlying rhythm of: no STEMI   Consultations Obtained:  I requested consultation with the cardiology on call Dr. Anne Fu,  and discussed lab and imaging findings as well as pertinent plan - they recommend: increase metoprolol dose from 25 mg --> 50 mg   Test / Admission - Considered:  Patient presents today with complaints of tachycardia.  According to EMS, patient was initially found to have a heart rate in the 180s which converted with 6 mg of adenosine.  After conversion, patient is completely asymptomatic and has remained as such throughout his visit today.  He is afebrile, nontoxic-appearing, and in no acute distress with reassuring vital signs.  Laboratory evaluation unremarkable, troponins flat.  Given that patient just had a CABG I did discuss dispo with cardiology on-call who recommended increasing his dose of metoprolol but felt that he was safe to be discharged with close cardiology follow-up.  Patient states he has a follow-up scheduled with his surgeon this week.  Upon  reassessment, patient states he is still feeling well, his heart rate has remained in the 70s throughout his visit today and his heart rate is sinus rhythm.  Given this, no further emergent concerns at this time.  Patient is stable for discharge.  Educated on red flag symptoms that would prompt immediate return.  Patient is understanding and amenable with plan, discharged in stable condition.   Findings and plan of care discussed with supervising physician Dr. Dayna Ramus who is in agreement.     Final Clinical Impression(s) / ED Diagnoses Final diagnoses:  SVT (supraventricular tachycardia) (HCC)    Rx / DC Orders ED Discharge Orders     None     An After Visit Summary was printed and given to the patient.     Silva Bandy, PA-C 08/31/22 1815    Phoebe Sharps, DO 09/01/22 [phone]

## 2022-08-31 NOTE — Discharge Instructions (Signed)
As we discussed, your work-up in the ER today was reassuring for acute abnormalities.  I suspect that your rapid heart rate was related to your heart healing after your surgery.  I have discussed with your cardiologist group who feels you are safe to be discharged.  They do recommend that you double your Toprol XL dose from 25 mg daily to 50 mg daily.  I also recommend that you follow-up closely with your cardiology group, your surgeon, and your primary care doctor for continued evaluation and management.  Return if development of any new or worsening symptoms.

## 2022-08-31 NOTE — ED Triage Notes (Signed)
Pt BIB [facility] from his PCP. Pt went in for feeling like his heart was racing. He was in the 180's so they called EMS. EMS have 6 of adenosine and pt converted to NS with HR in the 90's. Pt states he had open heart surgery 2 weeks ago and has an appointment with cardiology tomorrow.

## 2022-09-01 ENCOUNTER — Ambulatory Visit (INDEPENDENT_AMBULATORY_CARE_PROVIDER_SITE_OTHER): Payer: Self-pay | Admitting: Thoracic Surgery (Cardiothoracic Vascular Surgery)

## 2022-09-01 DIAGNOSIS — I2510 Atherosclerotic heart disease of native coronary artery without angina pectoris: Secondary | ICD-10-CM

## 2022-09-01 NOTE — Progress Notes (Signed)
     [address]       [place] [address]             [phone]       Patient: Home Provider: Office Consent for Telemedicine visit obtained.  Today's visit was completed via a real-time telehealth (see specific modality noted below). The patient/authorized person provided oral consent at the time of the visit to engage in a telemedicine encounter with the present provider at [facility]. The patient/authorized person was informed of the potential benefits, limitations, and risks of telemedicine. The patient/authorized person expressed understanding that the laws that protect confidentiality also apply to telemedicine. The patient/authorized person acknowledged understanding that telemedicine does not provide emergency services and that he or she would need to call 911 or proceed to the nearest hospital for help if such a need arose.   Total time spent in the clinical discussion 10 minutes.  Telehealth Modality: Phone visit (audio only)  I had a telephone visit with  Collie Siad who is s/p CABG.  Overall doing well.   Pain is minimal.  Ambulating well. He had a run of tachycardia for which he called EMS.  His metoprolol was increased from 25 to 50 mg.  Since that point he has had no other episodes.  Collie Siad will see [place] back in 1 month with a chest x-ray for cardiac rehab clearance.  Jonmarc Bodkin Keane Scrape

## 2022-09-08 ENCOUNTER — Ambulatory Visit: Payer: Medicare Other | Attending: Student | Admitting: Student

## 2022-09-08 ENCOUNTER — Encounter: Payer: Self-pay | Admitting: Student

## 2022-09-08 VITALS — BP 120/70 | HR 63 | Ht 67.0 in | Wt 201.0 lb

## 2022-09-08 DIAGNOSIS — Z951 Presence of aortocoronary bypass graft: Secondary | ICD-10-CM | POA: Insufficient documentation

## 2022-09-08 DIAGNOSIS — Z794 Long term (current) use of insulin: Secondary | ICD-10-CM

## 2022-09-08 DIAGNOSIS — I471 Supraventricular tachycardia: Secondary | ICD-10-CM | POA: Insufficient documentation

## 2022-09-08 DIAGNOSIS — I10 Essential (primary) hypertension: Secondary | ICD-10-CM | POA: Diagnosis not present

## 2022-09-08 DIAGNOSIS — E118 Type 2 diabetes mellitus with unspecified complications: Secondary | ICD-10-CM | POA: Diagnosis present

## 2022-09-08 DIAGNOSIS — I2510 Atherosclerotic heart disease of native coronary artery without angina pectoris: Secondary | ICD-10-CM

## 2022-09-08 DIAGNOSIS — E785 Hyperlipidemia, unspecified: Secondary | ICD-10-CM | POA: Insufficient documentation

## 2022-09-08 DIAGNOSIS — R0602 Shortness of breath: Secondary | ICD-10-CM

## 2022-09-08 NOTE — Patient Instructions (Addendum)
Medication Instructions:  Your physician recommends that you continue on your current medications as directed. Please refer to the Current Medication list given to you today.  *If you need a refill on your cardiac medications before your next appointment, please call your pharmacy*   Lab Work: Your physician recommends that you have the following labs drawn today: BNP and Lipids.  If you have labs (blood work) drawn today and your tests are completely normal, you will receive your results only by: MyChart Message (if you have MyChart) OR A paper copy in the mail If you have any lab test that is abnormal or we need to change your treatment, we will call you to review the results.   Testing/Procedures: NONE ordered at this time of appointment   Follow-Up: At [facility], you and your health needs are our priority.  As part of our continuing mission to provide you with exceptional heart care, we have created designated Provider Care Teams.  These Care Teams include your primary Cardiologist (physician) and Advanced Practice Providers (APPs -  Physician Assistants and Nurse Practitioners) who all work together to provide you with the care you need, when you need it.  We recommend signing up for the patient portal called "MyChart".  Sign up information is provided on this After Visit Summary.  MyChart is used to connect with patients for Virtual Visits (Telemedicine).  Patients are able to view lab/test results, encounter notes, upcoming appointments, etc.  Non-urgent messages can be sent to your provider as well.   To learn more about what you can do with MyChart, go to [email or web address].    Please keep your upcoming appointment with Dr. Flora Lipps.

## 2022-09-11 ENCOUNTER — Other Ambulatory Visit: Payer: Self-pay

## 2022-09-11 ENCOUNTER — Telehealth (HOSPITAL_COMMUNITY): Payer: Self-pay

## 2022-09-11 MED ORDER — FUROSEMIDE 40 MG PO TABS: 40.0000 mg | ORAL_TABLET | Freq: Every day | ORAL | 0 refills | Status: DC

## 2022-09-11 MED ORDER — POTASSIUM CHLORIDE CRYS ER 20 MEQ PO TBCR: 20.0000 meq | EXTENDED_RELEASE_TABLET | Freq: Every day | ORAL | 0 refills | Status: DC

## 2022-09-11 NOTE — Telephone Encounter (Signed)
Pt is not interested in the cardiac rehab program. Closed referral 

## 2022-09-16 LAB — LIPID PANEL
Chol/HDL Ratio: 3.1 ratio (ref 0.0–5.0)
Cholesterol, Total: 127 mg/dL (ref 100–199)
HDL: 41 mg/dL (ref >39)
LDL Chol Calc (NIH): 75 mg/dL (ref 0–99)
Triglycerides: 50 mg/dL (ref 0–149)
VLDL Cholesterol Cal: 11 mg/dL (ref 5–40)

## 2022-09-16 LAB — BRAIN NATRIURETIC PEPTIDE: BNP: 253.1 pg/mL — ABNORMAL HIGH (ref 0.0–100.0)

## 2022-09-25 ENCOUNTER — Other Ambulatory Visit: Payer: Self-pay

## 2022-09-25 DIAGNOSIS — E782 Mixed hyperlipidemia: Secondary | ICD-10-CM

## 2022-09-25 DIAGNOSIS — I2510 Atherosclerotic heart disease of native coronary artery without angina pectoris: Secondary | ICD-10-CM

## 2022-09-25 DIAGNOSIS — R0602 Shortness of breath: Secondary | ICD-10-CM

## 2022-09-25 MED ORDER — EZETIMIBE 10 MG PO TABS: 10.0000 mg | ORAL_TABLET | Freq: Every day | ORAL | 3 refills | Status: DC

## 2022-09-26 ENCOUNTER — Telehealth: Payer: Self-pay | Admitting: Cardiovascular Disease

## 2022-09-26 NOTE — Telephone Encounter (Signed)
Spoke with patient and verified with him that he is to take simvastatin and zetia. He verbalized understanding.

## 2022-09-26 NOTE — Telephone Encounter (Signed)
Pt c/o medication issue:  1. Name of Medication:   simvastatin (ZOCOR) 40 MG tablet  ezetimibe (ZETIA) 10 MG tablet  2. How are you currently taking this medication (dosage and times per day)?   3. Are you having a reaction (difficulty breathing--STAT)?  4. What is your medication issue? Pt wants to know if he was supposed to be taking both of these together
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
## 2022-10-03 ENCOUNTER — Ambulatory Visit: Payer: Medicare Other

## 2022-10-10 ENCOUNTER — Other Ambulatory Visit: Payer: Self-pay | Admitting: Thoracic Surgery (Cardiothoracic Vascular Surgery)

## 2022-10-10 DIAGNOSIS — Z951 Presence of aortocoronary bypass graft: Secondary | ICD-10-CM

## 2022-10-11 ENCOUNTER — Ambulatory Visit
Admission: RE | Admit: 2022-10-11 | Discharge: 2022-10-11 | Disposition: A | Discharge status: Home / Self Care | Payer: Medicare Other | Source: Ambulatory Visit | Attending: Thoracic Surgery (Cardiothoracic Vascular Surgery) | Admitting: Thoracic Surgery (Cardiothoracic Vascular Surgery)

## 2022-10-11 ENCOUNTER — Encounter: Payer: Self-pay | Admitting: Physician Assistant

## 2022-10-11 ENCOUNTER — Ambulatory Visit (INDEPENDENT_AMBULATORY_CARE_PROVIDER_SITE_OTHER): Payer: Self-pay | Admitting: Physician Assistant

## 2022-10-11 VITALS — BP 160/80 | HR 72 | Resp 20 | Ht 67.0 in | Wt 204.0 lb

## 2022-10-11 DIAGNOSIS — Z951 Presence of aortocoronary bypass graft: Secondary | ICD-10-CM

## 2022-10-11 DIAGNOSIS — I2510 Atherosclerotic heart disease of native coronary artery without angina pectoris: Secondary | ICD-10-CM

## 2022-10-11 MED ORDER — LOSARTAN POTASSIUM 50 MG PO TABS
50.0000 mg | ORAL_TABLET | Freq: Every day | ORAL | 1 refills | Status: DC
Start: 2022-10-11 — End: 2022-12-20

## 2022-10-11 NOTE — Patient Instructions (Signed)
Patient was  encouraged to enroll and participate in the outpatient cardiac rehab program beginning as soon as practical.  He was instructed he may return to driving an automobile as long as he is no longer requiring oral narcotic pain relievers during the daytime.  It would be wise to start driving only short distances during the daylight and gradually increase from there as you feel comfortable.  3. Make every effort to keep your diabetes under very tight control.  Follow up closely with your primary care physician or endocrinologist and strive to keep their hemoglobin A1c levels as low as possible, preferably near or below 6.0.  The long term benefits of strict control of diabetes are far reaching and critically important for your overall health and survival.  4. Continue to avoid any heavy lifting or strenuous use of your arms or shoulders for at least a total of three months from the time of surgery.  After three months you may gradually increase how much you lift or otherwise use your arms or chest as tolerated, with limits based upon whether or not activities lead to the return of significant discomfort.

## 2022-10-23 ENCOUNTER — Other Ambulatory Visit: Payer: Self-pay | Admitting: Physician Assistant

## 2022-11-28 NOTE — Progress Notes (Signed)
Cardiology Office Note:   Date:  12/01/2022  NAME:  Antonio Patterson    MRN: [id number] DOB:  1951-02-04   PCP:  Rhea Bleacher, NP  Cardiologist:  Evalina Field, MD  Electrophysiologist:  None   Referring MD: Rhea Bleacher, NP   Chief Complaint  Patient presents with   Follow-up   History of Present Illness:   Antonio Patterson is a 71 y.o. male with a hx of CAD status post CABG in August, pulmonary hypertension, obesity who presents for follow-up.  Underwent bypass surgery.  Had an SVT episode that was treated in the emergency room with adenosine.  Presents for follow-up today.  Needs a home sleep study.  He overall reports he is doing well.  Heart rate is well-controlled.  No recurrence of SVT.  His blood pressure is elevated.  He does consume high salty meals.  Back on losartan 100.  On HCTZ as well.  We discussed increasing the dose of HCTZ.  He snores.  He has fatigue.  His STOP-BANG score is 7.  He does have mild pulm hypertension.  He needs a sleep study.  We discussed getting his cholesterol lower.  Intolerant of Lipitor and Crestor.  We will refer him for PCSK9 inhibitor therapy.  Denies chest pain or trouble breathing.  His symptoms of shortness of breath have improved after bypass surgery.  Overall doing much better.  Problem List CAD -CABG x 3 08/17/2022 2. Mild to moderate pHTN -normal PCWP -mPAP 26 mmHG -OSA? 3. DM -A1c 5.9 4. HLD  -T chol 127, HDL 41, LDL 75, TG 50 5. 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   UNSPECIFIED PERIPHERAL VASCULAR DISEASE 11/13/2008    Past Surgical History: Past Surgical History:  Procedure Laterality Date   CORONARY ARTERY BYPASS GRAFT N/A 08/17/2022   Procedure: CORONARY ARTERY BYPASS GRAFTING (CABG) X3 BYPASSES USING OPEN LEFT INTERNAL MAMMARY ARTERY AND ENDOSCOPIC RIGHT GREATER SAPHENOUS VEIN HARVEST.;  Surgeon: Lajuana Matte, MD;  Location: [facility];  Service: Open Heart Surgery;  Laterality: N/A;   INTRAVASCULAR PRESSURE WIRE/FFR STUDY N/A 07/14/2022   Procedure: INTRAVASCULAR PRESSURE WIRE/FFR STUDY;  Surgeon: Nelva Bush, MD;  Location: [facility];  Service: Cardiovascular;  Laterality: N/A;   RIGHT/LEFT HEART CATH AND CORONARY ANGIOGRAPHY N/A 07/14/2022   Procedure: RIGHT/LEFT HEART CATH AND CORONARY ANGIOGRAPHY;  Surgeon: Nelva Bush, MD;  Location: [facility];  Service: Cardiovascular;  Laterality: N/A;   TEE WITHOUT CARDIOVERSION N/A 08/17/2022   Procedure: TRANSESOPHAGEAL ECHOCARDIOGRAM (TEE);  Surgeon: Lajuana Matte, MD;  Location: [facility];  Service: Open Heart Surgery;  Laterality: N/A;   TURBINATE REDUCTION Bilateral 2017   VASECTOMY  1976    Current Medications: Current Meds  Medication Sig   acetaminophen (TYLENOL) 650 MG CR tablet Take 1,300 mg by mouth every 8 (eight) hours as needed for pain.   aspirin EC 325 MG tablet Take 1 tablet (325 mg total) by mouth daily.  glucose blood test strip 1 each by Other route in the morning, at noon, in the evening, and at bedtime.    hydrochlorothiazide (HYDRODIURIL) 12.5 MG tablet Take 12.5 mg by mouth daily.   insulin aspart (NOVOLOG FLEXPEN RELION) 100 UNIT/ML FlexPen Inject 5-40 Units into the skin See admin instructions. 3-5 times per day, and pen needles 1/day   losartan (COZAAR) 50 MG tablet Take 1 tablet (50 mg total) by mouth daily. (Patient taking differently: Take 100 mg by mouth daily.)   metoprolol succinate (TOPROL-XL) 50 MG 24 hr tablet Take  50 mg by mouth daily. Take with or immediately following a meal.   omeprazole (PRILOSEC) 40 MG capsule Take 40 mg by mouth daily.   oxymetazoline (AFRIN) 0.05 % nasal spray Place 1 spray into both nostrils 2 (two) times daily.   simvastatin (ZOCOR) 40 MG tablet Take 40 mg by mouth daily.   tamsulosin (FLOMAX) 0.4 MG CAPS capsule Take 1 capsule (0.4 mg total) by mouth daily.   traMADol (ULTRAM) 50 MG tablet Take 1 tablet (50 mg total) by mouth every 6 (six) hours as needed for moderate pain.   Vitamin D, Ergocalciferol, (DRISDOL) 1.25 MG (50000 UT) CAPS capsule Take one tablet wkly     Allergies:    Lipitor [atorvastatin], Metformin, Rosuvastatin, Sulfa antibiotics, and Sulfonamide derivatives   Social History: Social History   Socioeconomic History   Marital status: Married    Spouse name: Not on file   Number of children: 1   Years of education: Not on file   Highest education level: Not on file  Occupational History    Employer: [employer]    Comment: Works for [employer]   Occupation: Engineering geologist  Tobacco Use   Smoking status: Former    Packs/day: 1.00    Years: 10.00    Total pack years: 10.00    Types: Cigarettes    Quit date: 1971    Years since quitting: 52.9   Smokeless tobacco: Never  Vaping Use   Vaping Use: Never used  Substance and Sexual Activity   Alcohol use: Yes    Comment: maybe one drink per week   Drug use: Never   Sexual activity: Not Currently  Other Topics Concern   Not on file  Social History Narrative   Had 2 children. 1 is deceased   Social Determinants of Corporate investment banker Strain: Not on file  Food Insecurity: Not on file  Transportation Needs: Not on file  Physical Activity: Not on file  Stress: Not on file  Social Connections: Not on file     Family History: The patient's family history includes Cancer in his maternal grandmother and paternal grandfather; Heart disease in his father;  Stroke in his maternal grandfather.  ROS:   All other ROS reviewed and negative. Pertinent positives noted in the HPI.     EKGs/Labs/Other Studies Reviewed:   The following studies were personally reviewed by me today:  TTE 07/14/2022  1. Left ventricular ejection fraction, by estimation, is 60 to 65%. The  left ventricle has normal function. The left ventricle has no regional  wall motion abnormalities. Left ventricular diastolic parameters were  normal.   2. Right ventricular systolic function is normal. The right ventricular  size is normal.   3. The mitral valve is abnormal. No evidence of mitral valve  regurgitation. No evidence of mitral stenosis.   4. The aortic valve is tricuspid. There is mild calcification  of the  aortic valve. There is mild thickening of the aortic valve. Aortic valve  regurgitation is not visualized. Aortic valve sclerosis is present, with  no evidence of aortic valve stenosis.   5. The inferior vena cava is normal in size with greater than 50%  respiratory variability, suggesting right atrial pressure of 3 mmHg.   Recent Labs: 05/26/2022: TSH 2.410 08/18/2022: Magnesium 2.2 08/31/2022: ALT 17; BUN 16; Creatinine, Ser 1.17; Hemoglobin 12.4; Platelets 423; Potassium 4.1; Sodium 135 09/08/2022: BNP 253.1   Recent Lipid Panel    Component Value Date/Time   CHOL 127 09/08/2022 1107   TRIG 50 09/08/2022 1107   HDL 41 09/08/2022 1107   CHOLHDL 3.1 09/08/2022 1107   CHOLHDL 3 02/22/2018 1134   VLDL 7.6 02/22/2018 1134   LDLCALC 75 09/08/2022 1107    Physical Exam:   VS:  BP (!) 148/78   Pulse 67   Ht 5\' 7"  (1.702 m)   Wt 206 lb 9.6 oz (93.7 kg)   BMI 32.36 kg/m    Wt Readings from Last 3 Encounters:  12/01/22 206 lb 9.6 oz (93.7 kg)  10/11/22 204 lb (92.5 kg)  09/08/22 201 lb (91.2 kg)    General: Well nourished, well developed, in no acute distress Head: Atraumatic, normal size  Eyes: PEERLA, EOMI  Neck: Supple, no JVD Endocrine: No  thryomegaly Cardiac: Normal S1, S2; RRR; no murmurs, rubs, or gallops Lungs: Clear to auscultation bilaterally, no wheezing, rhonchi or rales  Abd: Soft, nontender, no hepatomegaly  Ext: No edema, pulses 2+ Musculoskeletal: No deformities, BUE and BLE strength normal and equal Skin: Warm and dry, no rashes   Neuro: Alert and oriented to person, place, time, and situation, CNII-XII grossly intact, no focal deficits  Psych: Normal mood and affect   ASSESSMENT:   Antonio Patterson is a 71 y.o. male who presents for the following: 1. Coronary artery disease involving native coronary artery of native heart without angina pectoris   2. Mixed hyperlipidemia   3. Primary hypertension   4. Pulmonary hypertension, unspecified ([place])   5. Snoring   6. Other fatigue     PLAN:   1. Coronary artery disease involving native coronary artery of native heart without angina pectoris 2. Mixed hyperlipidemia -Status post three-vessel CABG.  Doing well.  Reduce aspirin to 81 mg daily.  Lipids not controlled.  He is on simvastatin.  Referral to pharmacy clinic for PCSK9 inhibitor therapy.  See discussion on blood pressure below.  LDL goal less than 50.  Diabetes well-controlled.  3. Primary hypertension -BP elevated.  Suspect there is sleep apnea.  We discussed salt reduction as well.  Increase HCTZ to 25 mg daily.  Continue metoprolol succinate 50 mg daily, losartan 100 mg daily.  I suspect with salt reduction and possible sleep apnea treatment is blood pressure will be improved.  Will see how he does.  No recurrence of SVT.  Suspect this was all related to surgery.  4. Pulmonary hypertension, unspecified ([place]) 5. Snoring 6. Other fatigue -High blood pressure.  Obesity.  Pulmonary hypertension.  He does snore.  He has fatigue.  We will set him up for a home sleep study.  STOP-BANG score 7.  Disposition: Return in about 6 months (around 06/02/2023).  Medication Adjustments/Labs and Tests Ordered: Current  medicines are reviewed at length with the patient today.  Concerns regarding medicines are outlined above.  No orders of the defined types were placed in this encounter.  No orders  of the defined types were placed in this encounter.   Patient Instructions  Medication Instructions:  Increase HCTZ to 25 mg daily   *If you need a refill on your cardiac medications before your next appointment, please call your pharmacy*   Testing/Procedures: Jaynie Crumble SLEEP STUDY    Follow-Up: At [facility], you and your health needs are our priority.  As part of our continuing mission to provide you with exceptional heart care, we have created designated Provider Care Teams.  These Care Teams include your primary Cardiologist (physician) and Advanced Practice Providers (APPs -  Physician Assistants and Nurse Practitioners) who all work together to provide you with the care you need, when you need it.  We recommend signing up for the patient portal called "MyChart".  Sign up information is provided on this After Visit Summary.  MyChart is used to connect with patients for Virtual Visits (Telemedicine).  Patients are able to view lab/test results, encounter notes, upcoming appointments, etc.  Non-urgent messages can be sent to your provider as well.   To learn more about what you can do with MyChart, go to [email or web address].    Your next appointment:   6 month(s)  The format for your next appointment:   In Person  Provider:   Evalina Field, MD            Time Spent with Patient: I have spent a total of 25 minutes with patient reviewing hospital notes, telemetry, EKGs, labs and examining the patient as well as establishing an assessment and plan that was discussed with the patient.  > 50% of time was spent in direct patient care.  Signed, Addison Naegeli. Audie Box, MD, [place]  [address] [place], [place] [address] [phone]  12/01/2022 2:42  PM

## 2022-12-01 ENCOUNTER — Ambulatory Visit: Payer: Medicare Other | Attending: Cardiovascular Disease | Admitting: Cardiovascular Disease

## 2022-12-01 ENCOUNTER — Telehealth: Payer: Self-pay | Admitting: *Deleted

## 2022-12-01 ENCOUNTER — Encounter: Payer: Self-pay | Admitting: Cardiovascular Disease

## 2022-12-01 VITALS — BP 148/78 | HR 67 | Ht 67.0 in | Wt 206.6 lb

## 2022-12-01 DIAGNOSIS — I10 Essential (primary) hypertension: Secondary | ICD-10-CM | POA: Diagnosis not present

## 2022-12-01 DIAGNOSIS — I2510 Atherosclerotic heart disease of native coronary artery without angina pectoris: Secondary | ICD-10-CM

## 2022-12-01 DIAGNOSIS — R5383 Other fatigue: Secondary | ICD-10-CM

## 2022-12-01 DIAGNOSIS — R0683 Snoring: Secondary | ICD-10-CM | POA: Diagnosis present

## 2022-12-01 DIAGNOSIS — E782 Mixed hyperlipidemia: Secondary | ICD-10-CM | POA: Diagnosis not present

## 2022-12-01 DIAGNOSIS — I272 Pulmonary hypertension, unspecified: Secondary | ICD-10-CM | POA: Diagnosis not present

## 2022-12-01 MED ORDER — HYDROCHLOROTHIAZIDE 25 MG PO TABS: 25.0000 mg | ORAL_TABLET | Freq: Every day | ORAL | 3 refills | Status: DC

## 2022-12-01 MED ORDER — ASPIRIN 81 MG PO TBEC: 81.0000 mg | DELAYED_RELEASE_TABLET | Freq: Every day | ORAL | Status: DC

## 2022-12-01 NOTE — Telephone Encounter (Signed)
Notified Antonio Patterson ok to activate itamar device.  

## 2022-12-01 NOTE — Patient Instructions (Addendum)
Medication Instructions:  Increase HCTZ to 25 mg daily   *If you need a refill on your cardiac medications before your next appointment, please call your pharmacy*   Testing/Procedures: Donnie Coffin SLEEP STUDY    Follow-Up: At [facility], you and your health needs are our priority.  As part of our continuing mission to provide you with exceptional heart care, we have created designated Provider Care Teams.  These Care Teams include your primary Cardiologist (physician) and Advanced Practice Providers (APPs -  Physician Assistants and Nurse Practitioners) who all work together to provide you with the care you need, when you need it.  We recommend signing up for the patient portal called "MyChart".  Sign up information is provided on this After Visit Summary.  MyChart is used to connect with patients for Virtual Visits (Telemedicine).  Patients are able to view lab/test results, encounter notes, upcoming appointments, etc.  Non-urgent messages can be sent to your provider as well.   To learn more about what you can do with MyChart, go to [email or web address].    Your next appointment:   6 month(s)  The format for your next appointment:   In Person  Provider:   Reatha Harps, MD     Check BP at home

## 2022-12-04 ENCOUNTER — Telehealth: Payer: Self-pay

## 2022-12-04 NOTE — Telephone Encounter (Signed)
Called and made the patient aware that HE may proceed with the Itamar Home Sleep Study. PIN # provided to the patient. Patient made aware that HE will be contacted after the test has been read with the results and any recommendations. Patient verbalized understanding and thanked me for the call.   

## 2022-12-20 ENCOUNTER — Telehealth: Payer: Self-pay | Admitting: Cardiovascular Disease

## 2022-12-20 MED ORDER — LOSARTAN POTASSIUM 100 MG PO TABS: 100.0000 mg | ORAL_TABLET | Freq: Every day | ORAL | 7 refills | Status: DC

## 2022-12-20 NOTE — Telephone Encounter (Signed)
  Pt c/o medication issue:  1. Name of Medication:   losartan (COZAAR) 50 MG tablet    2. How are you currently taking this medication (dosage and times per day)?   Take 1 tablet (50 mg total) by mouth daily.Patient taking differently: Take 100 mg by mouth daily.    3. Are you having a reaction (difficulty breathing--STAT)? No   4. What is your medication issue? Pt needs refill today, however, Dr. Flora Lipps changed his dose to take 100 mg a day. He wants to get new prescription sent to his pharmacy   [facility], [place] - [address]  Today since he is leaving out of town tomorrow

## 2022-12-20 NOTE — Telephone Encounter (Signed)
Per WO progress note on 12-8:Continue metoprolol succinate 50 mg daily, losartan 100 mg daily.   Sent refill to pharmacy listed in this note. Pt informed of providers result & recommendations. Pt verbalized understanding. All questions, if any, were answered.

## 2022-12-21 ENCOUNTER — Telehealth: Payer: Self-pay

## 2022-12-21 ENCOUNTER — Other Ambulatory Visit: Payer: Self-pay

## 2022-12-21 MED ORDER — SPIRONOLACTONE 25 MG PO TABS: 25.0000 mg | ORAL_TABLET | Freq: Every day | ORAL | 3 refills | Status: DC

## 2022-12-21 NOTE — Telephone Encounter (Signed)
-----   Message from Sande Rives, MD sent at 12/20/2022  7:07 PM EST ----- BP not at goal. Add aldactone 25 mg daily.   Gerri Spore T. Flora Lipps, MD, [facility]  [facility]  [address] [place], [place] [address] [phone]  7:08 PM  ----- Message ----- From: Brunetta Genera, CMA Sent: 12/20/2022  11:11 AM EST To: Sande Rives, MD; Darene Lamer, LPN

## 2022-12-21 NOTE — Telephone Encounter (Signed)
Contacted patient, advised of message from MD.   Antonio Patterson to pharmacy- patient aware he will pick up today.

## 2023-01-12 ENCOUNTER — Encounter: Payer: Self-pay | Admitting: Pharmacist Clinician (PhC)/ Clinical Pharmacy Specialist

## 2023-01-12 ENCOUNTER — Ambulatory Visit: Payer: Medicare Other | Attending: Cardiology | Admitting: Pharmacist Clinician (PhC)/ Clinical Pharmacy Specialist

## 2023-01-12 DIAGNOSIS — E785 Hyperlipidemia, unspecified: Secondary | ICD-10-CM

## 2023-01-12 NOTE — Patient Instructions (Signed)
Your Results:             Your most recent labs Goal  Total Cholesterol 127 < 200  Triglycerides 50 < 150  HDL (happy/good cholesterol) 41 > 40  LDL (lousy/bad cholesterol 75 < 55   Medication changes:  We will start the process to get Repatha covered by your insurance.  Once the prior authorization is complete, I will send a MyChart message to let you know and confirm pharmacy information.   You will take one injection every 14 days.    Lab orders:  We want to repeat labs after 2-3 months.  We will send you a lab order to remind you once we get closer to that time.    Patient Assistance:  The [employer] foundation offers assistance to help pay for medication copays.  They will cover copays for all cholesterol lowering meds, including statins, fibrates, omega-3 oils, ezetimibe, Repatha, Praluent, Nexletol, Nexlizet.  The cards are usually good for $2,500 or 12 months, whichever comes first. Go to healthwellfoundation.org Click on "Apply Now" Answer questions as to whom is applying (patient or representative) Your disease fund will be "hypercholesterolemia - Medicare access" Select the cholesterol medication you need assistance with (Repatha, Praluent, Nexlizet...) They will ask question about qualifying diagnosis - you can mark "yes"; and do you have insurance coverage.   When they ask what type of assistance you are interested in - "copay assistance" When you submit, the approval is usually within minutes.  You will need to print the card information from the site You will need to show this information to your pharmacy, they will bill your Medicare Part D plan first -then bill [employer] Well --for the copay.   (ID #, BIN #, PCN #, GRP #) You can also call them at [phone], although the hold times can be quite long.   Thank you for choosing [facility]

## 2023-01-12 NOTE — Assessment & Plan Note (Signed)
Assessment: Patient with ASCVD not at LDL goal of < 55 Most recent LDL 75 on 09/08/2022 Has been compliant with moderate intensity statin: simvastatin 40 mg daily Not able to tolerate high intensity statins secondary to myalgias: atorvastatin and rosuvastatin Reviewed options for lowering LDL cholesterol, including ezetimibe, PCSK-9 inhibitors, bempedoic acid and inclisiran.  Discussed mechanisms of action, dosing, side effects, potential decreases in LDL cholesterol and costs.  Also reviewed potential options for patient assistance.  Plan: Patient agreeable to starting Repatha 140 mg q14d Repeat labs after:  3 months Lipid Liver function Patient was given information on [employer] - he will sign up at home once medication approved

## 2023-01-12 NOTE — Progress Notes (Signed)
Office Visit    Patient Name: Antonio Patterson Date of Encounter: 01/12/2023  Primary Care Provider:  Rhea Bleacher, NP Primary Cardiologist:  Evalina Field, MD  Chief Complaint    Hyperlipidemia   Significant Past Medical History   ASCVD 8/23 - CABG x 3  HTN Increased hctz at last appt; on losartan 100, metoprolol succ 50  OSA ?  Sleep study ordered  preDM A1c 5.9  Pulmonary hypertension      Allergies  Allergen Reactions   Lipitor [Atorvastatin]     Muscle pain   Metformin Diarrhea   Rosuvastatin     Muscle pain   Sulfa Antibiotics Rash    Unknown reaction   Sulfonamide Derivatives Rash    Unknown reaction    History of Present Illness    Antonio Patterson is a 72 y.o. male patient of Dr Audie Box, in the office today to discuss options for further cholesterol lowering.  Patient has been on simvastatin for about two years, having failed both atorvastatin and rosuvastatin secondary to myalgias.  Over the past 10 years his LDL cholesterol has been maintained at < 90.  Despite this, he was found to have a calcium score of 6107, which led to heart cath and CABG x 3 last summer.     Insurance Carrier: [employer] [place]  ID [id number];  BIN [id number], PCN [id number]  LDL Cholesterol goal:  LDL < 55  Current Medications:   simvastatin 40 mg qd  (2-3 years)  Previously tried:  atorvastatin, rosuvastatin both caused myalgias  Family Hx:   farther died angina at 50, 2 sisters (1 passed) and 1 brother, no heart issues, in brother either; daughter healthy, son died from MI at 58  Social Hx: Tobacco: no  Alcohol:   occasional, limits to 1-2 when does   Diet:  eats out 1/2, some fast foods and fried foods; some vegetables at home, canned; variety of protein  Exercise: chores around the house   Accessory Clinical Findings   Lab Results  Component Value Date   CHOL 127 09/08/2022   HDL 41 09/08/2022   LDLCALC 75 09/08/2022   TRIG 50 09/08/2022   CHOLHDL 3.1 09/08/2022    Lab  Results  Component Value Date   ALT 17 08/31/2022   AST 20 08/31/2022   ALKPHOS 68 08/31/2022   BILITOT 1.1 08/31/2022   Lab Results  Component Value Date   CREATININE 1.17 08/31/2022   BUN 16 08/31/2022   NA 135 08/31/2022   K 4.1 08/31/2022   CL 103 08/31/2022   CO2 24 08/31/2022   Lab Results  Component Value Date   HGBA1C 5.9 (H) 08/15/2022    Home Medications    Current Outpatient Medications  Medication Sig Dispense Refill   hydrochlorothiazide (HYDRODIURIL) 12.5 MG tablet Take 12.5 mg by mouth daily.     acetaminophen (TYLENOL) 650 MG CR tablet Take 1,300 mg by mouth every 8 (eight) hours as needed for pain.     aspirin EC 81 MG tablet Take 1 tablet (81 mg total) by mouth daily.     glucose blood test strip 1 each by Other route in the morning, at noon, in the evening, and at bedtime.      insulin aspart (NOVOLOG FLEXPEN RELION) 100 UNIT/ML FlexPen Inject 5-40 Units into the skin See admin instructions. 3-5 times per day, and pen needles 1/day 45 mL 11   losartan (COZAAR) 100 MG tablet Take 1 tablet (100 mg total)  by mouth daily. 30 tablet 7   metoprolol succinate (TOPROL-XL) 50 MG 24 hr tablet Take 50 mg by mouth daily. Take with or immediately following a meal.     omeprazole (PRILOSEC) 40 MG capsule Take 40 mg by mouth daily.     oxymetazoline (AFRIN) 0.05 % nasal spray Place 1 spray into both nostrils 2 (two) times daily.     simvastatin (ZOCOR) 40 MG tablet Take 40 mg by mouth daily.     spironolactone (ALDACTONE) 25 MG tablet Take 1 tablet (25 mg total) by mouth daily. 90 tablet 3   tamsulosin (FLOMAX) 0.4 MG CAPS capsule Take 1 capsule (0.4 mg total) by mouth daily. 90 capsule 1   traMADol (ULTRAM) 50 MG tablet Take 1 tablet (50 mg total) by mouth every 6 (six) hours as needed for moderate pain. 28 tablet 0   Vitamin D, Ergocalciferol, (DRISDOL) 1.25 MG (50000 UT) CAPS capsule Take one tablet wkly 12 capsule 3   No current facility-administered medications for  this visit.     Assessment & Plan    Dyslipidemia Assessment: Patient with ASCVD not at LDL goal of < 55 Most recent LDL 75 on 09/08/2022 Has been compliant with moderate intensity statin: simvastatin 40 mg daily Not able to tolerate high intensity statins secondary to myalgias: atorvastatin and rosuvastatin Reviewed options for lowering LDL cholesterol, including ezetimibe, PCSK-9 inhibitors, bempedoic acid and inclisiran.  Discussed mechanisms of action, dosing, side effects, potential decreases in LDL cholesterol and costs.  Also reviewed potential options for patient assistance.  Plan: Patient agreeable to starting Repatha 140 mg q14d Repeat labs after:  3 months Lipid Liver function Patient was given information on [employer] - he will sign up at home once medication approved   Tommy Medal, PharmD CPP [facility] [address] [place]  [place], [place] [address] [phone]  01/12/2023, 4:32 PM

## 2023-01-16 ENCOUNTER — Telehealth: Payer: Self-pay | Admitting: Cardiovascular Disease

## 2023-01-16 NOTE — Telephone Encounter (Signed)
Pt c/o medication issue:  1. Name of Medication: Repatha   2. How are you currently taking this medication (dosage and times per day)?   3. Are you having a reaction (difficulty breathing--STAT)?   4. What is your medication issue? Pt states that he would like a call in regards to this medication that was suggested by Tommy Medal, RPH-CPP, on 1/19. He would like to go over his options again since he believes he will not qualify for patient assistance. Please advise.

## 2023-01-23 NOTE — Telephone Encounter (Signed)
Spoke with patient.  He does not qualify for the [employer] (income), so would like to move forward with Leqvio.  Will email form to him for his signature, then submit for benefits verification

## 2023-02-26 ENCOUNTER — Encounter: Payer: Self-pay | Admitting: Pharmacist Clinician (PhC)/ Clinical Pharmacy Specialist

## 2023-03-27 ENCOUNTER — Telehealth: Payer: Self-pay

## 2023-03-27 NOTE — Telephone Encounter (Signed)
I spoke with patient about the itamar device, he states that he will get it done this weekend. I told him I will give him a follow up call on Monday.

## 2023-03-28 ENCOUNTER — Other Ambulatory Visit: Payer: Self-pay | Admitting: Pharmacist Clinician (PhC)/ Clinical Pharmacy Specialist

## 2023-03-28 ENCOUNTER — Telehealth: Payer: Self-pay | Admitting: Pharmacy Technician

## 2023-03-28 ENCOUNTER — Encounter: Payer: Self-pay | Admitting: Pharmacist Clinician (PhC)/ Clinical Pharmacy Specialist

## 2023-03-28 NOTE — Telephone Encounter (Signed)
Tery Sanfilippo note:  Auth Submission: no auth needed Site of care: Site of care: [facility] Payer: medicare a/b & [employer] [place] Medication & CPT/J Code(s) submitted: Leqvio (Inclisiran) [phone] Route of submission (phone, fax, portal):  Phone # Fax # Auth type: Buy/Bill Units/visits requested: 2 Reference number:  Approval from: 03/28/23 to 12/25/23   Patient will be scheduled as soon as possible

## 2023-04-13 ENCOUNTER — Ambulatory Visit (INDEPENDENT_AMBULATORY_CARE_PROVIDER_SITE_OTHER): Payer: Medicare Other

## 2023-04-13 VITALS — BP 105/68 | HR 70 | Temp 98.1°F | Resp 20 | Ht 67.0 in | Wt 200.0 lb

## 2023-04-13 DIAGNOSIS — E785 Hyperlipidemia, unspecified: Secondary | ICD-10-CM

## 2023-04-13 MED ORDER — INCLISIRAN SODIUM 284 MG/1.5ML ~~LOC~~ SOSY
284.0000 mg | PREFILLED_SYRINGE | Freq: Once | SUBCUTANEOUS | Status: AC
  Administered 2023-04-13: 284 mg via SUBCUTANEOUS
  Filled 2023-04-13: qty 1.5

## 2023-04-13 NOTE — Progress Notes (Signed)
Diagnosis: Hyperlipidemia  Provider:  Chilton Greathouse MD  Procedure: Injection  Leqvio (inclisiran), Dose: 284 mg, Site: subcutaneous, Number of injections: 1  Administered in right arm.  Post Care: Observation period completed  Discharge: Condition: Stable, Destination: Home . AVS Provided  Performed by:  Wyvonne Lenz, RN

## 2023-05-09 NOTE — Telephone Encounter (Signed)
I spoke with the patient about his Itamar device, He states that he keeps forgetting to put it on before going to bed, But states he will try to get it done this weekend if he is in town.

## 2023-05-18 ENCOUNTER — Encounter (INDEPENDENT_AMBULATORY_CARE_PROVIDER_SITE_OTHER): Payer: Medicare Other | Admitting: Cardiology

## 2023-05-18 DIAGNOSIS — G4733 Obstructive sleep apnea (adult) (pediatric): Secondary | ICD-10-CM | POA: Diagnosis not present

## 2023-05-28 ENCOUNTER — Ambulatory Visit: Payer: Medicare Other | Attending: Cardiovascular Disease

## 2023-05-28 DIAGNOSIS — R5383 Other fatigue: Secondary | ICD-10-CM

## 2023-05-28 DIAGNOSIS — R0683 Snoring: Secondary | ICD-10-CM

## 2023-05-28 NOTE — Procedures (Signed)
SLEEP STUDY REPORT Patient Information Study Date: 05/18/2023 Patient Name: Antonio Patterson Patient ID: [id number] Birth Date: 1951-05-05 Age: 72 Gender: Male BMI: 32.5 (W=207 lb, H=5' 7'') Referring Physician: Lennie Odor, MD  TEST DESCRIPTION: Home sleep apnea testing was completed using the WatchPat, a Type 1 device, utilizing peripheral arterial tonometry (PAT), chest movement, actigraphy, pulse oximetry, pulse rate, body position and snore.  AHI was calculated with apnea and hypopnea using valid sleep time as the denominator. RDI includes apneas, hypopneas, and RERAs.  The data acquired and the scoring of sleep and all associated events were performed in accordance with the recommended standards and specifications as outlined in the AASM Manual for the Scoring of Sleep and Associated Events 2.2.0 (2015).  FINDINGS:  1.  Severe Obstructive Sleep Apnea with AHI 35.7/hr.   2.  No Central Sleep Apnea with pAHIc 5/hr.  3.  Oxygen desaturations as low as 74%.  4.  Moderate snoring was present. O2 sats were < 88% for 7.2 min.  5.  Total sleep time was 5 hrs and 46 min.  6.  8% of total sleep time was spent in REM sleep.   7.  Normal sleep onset latency at 18 min.   8.  Prolonged REM sleep onset latency at 263 min.   9.  Total awakenings were 20.  10. Arrhythmia detection:  None  DIAGNOSIS:   Severe Obstructive Sleep Apnea (G47.33) Nocturnal Hypoxemia  RECOMMENDATIONS:   1.  Clinical correlation of these findings is necessary.  The decision to treat obstructive sleep apnea (OSA) is usually based on the presence of apnea symptoms or the presence of associated medical conditions such as Hypertension, Congestive Heart Failure, Atrial Fibrillation or Obesity.  The most common symptoms of OSA are snoring, gasping for breath while sleeping, daytime sleepiness and fatigue.   2.  Initiating apnea therapy is recommended given the presence of symptoms and/or associated conditions. Recommend  proceeding with one of the following:     a.  Auto-CPAP therapy with a pressure range of 5-20cm H2O.     b.  An oral appliance (OA) that can be obtained from certain dentists with expertise in sleep medicine.  These are primarily of use in non-obese patients with mild and moderate disease.     c.  An ENT consultation which may be useful to look for specific causes of obstruction and possible treatment options.     d.  If patient is intolerant to PAP therapy, consider referral to ENT for evaluation for hypoglossal nerve stimulator.   3.  Close follow-up is necessary to ensure success with CPAP or oral appliance therapy for maximum benefit.  4.  A follow-up oximetry study on CPAP is recommended to assess the adequacy of therapy and determine the need for supplemental oxygen or the potential need for Bi-level therapy.  An arterial blood gas to determine the adequacy of baseline ventilation and oxygenation should also be considered.  5.  Healthy sleep recommendations include:  adequate nightly sleep (normal 7-9 hrs/night), avoidance of caffeine after noon and alcohol near bedtime, and maintaining a sleep environment that is cool, dark and quiet.  6.  Weight loss for overweight patients is recommended.  Even modest amounts of weight loss can significantly improve the severity of sleep apnea.  7.  Snoring recommendations include:  weight loss where appropriate, side sleeping, and avoidance of alcohol before bed.  8.  Operation of motor vehicle should be avoided when sleepy. Signature: Armanda Magic, MD; [facility]; Diplomat,  American Board of Sleep Medicine Electronically Signed: 05/28/2023 1:52:54 PM

## 2023-06-19 ENCOUNTER — Other Ambulatory Visit: Payer: Self-pay | Admitting: *Deleted

## 2023-06-19 MED ORDER — LOSARTAN POTASSIUM 100 MG PO TABS: 100.0000 mg | ORAL_TABLET | Freq: Every day | ORAL | 3 refills | Status: DC

## 2023-07-03 NOTE — Progress Notes (Signed)
Cardiology Office Note:   Date:  07/06/2023  NAME:  Antonio Patterson    MRN: [id number] DOB:  [birth year]   PCP:  Erskine Emery, NP  Cardiologist:  Reatha Harps, MD  Electrophysiologist:  None   Referring MD: Erskine Emery, NP   Chief Complaint  Patient presents with   Follow-up         History of Present Illness:   Antonio Patterson is a 72 y.o. male with a hx of CAD s/p CABG, OSA, DM, pHTN who presents for follow-up.  He continues to report symptoms of shortness of breath.  Occur with activity.  Symptoms have improved since bypass surgery but they are ongoing.  Weights are stable.  Blood pressure is at goal.  He is now on Leqvio.  Needs recheck on lipids.  His diabetes is much improved.  He does have obstructive sleep apnea which is severe.  He had pulmonary hypertension on his echocardiogram.  He seems to have not followed up with sleep medicine.  I do wonder if he has pulmonary hypertension that is contributing to his symptoms of shortness of breath.  His ECG is unchanged from prior.  CV exam unremarkable.  No signs of heart failure.  We discussed seeing a sleep specialist as well as repeating his echo.  Other than that he should remain active.  I encouraged him that activity is helpful.  Problem List CAD -CABG x 3 08/17/2022 2. Mild to moderate pHTN -normal PCWP -mPAP 26 mmHG 3. Severe OSA 4. DM -A1c 5.9 5. HLD  -T chol 127, HDL 41, LDL 75, TG 50 6. 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    UNSPECIFIED PERIPHERAL VASCULAR DISEASE 11/13/2008    Past Surgical History: Past Surgical History:  Procedure Laterality Date   CORONARY ARTERY BYPASS GRAFT N/A 08/17/2022   Procedure: CORONARY ARTERY BYPASS GRAFTING (CABG) X3 BYPASSES USING OPEN LEFT INTERNAL MAMMARY ARTERY AND ENDOSCOPIC RIGHT GREATER SAPHENOUS VEIN HARVEST.;  Surgeon: Corliss Skains, MD;  Location: [facility] OR;  Service: Open Heart Surgery;  Laterality: N/A;   CORONARY PRESSURE/FFR STUDY N/A 07/14/2022   Procedure: INTRAVASCULAR PRESSURE WIRE/FFR STUDY;  Surgeon: Yvonne Kendall, MD;  Location: [facility] INVASIVE CV LAB;  Service: Cardiovascular;  Laterality: N/A;   RIGHT/LEFT HEART CATH AND CORONARY ANGIOGRAPHY N/A 07/14/2022   Procedure: RIGHT/LEFT HEART CATH AND CORONARY ANGIOGRAPHY;  Surgeon: Yvonne Kendall, MD;  Location: [facility] INVASIVE CV LAB;  Service: Cardiovascular;  Laterality: N/A;   TEE WITHOUT CARDIOVERSION N/A 08/17/2022   Procedure: TRANSESOPHAGEAL ECHOCARDIOGRAM (TEE);  Surgeon: Corliss Skains, MD;  Location: [facility] OR;  Service: Open Heart Surgery;  Laterality: N/A;   TURBINATE REDUCTION Bilateral 2017   VASECTOMY  1976    Current Medications: Current Meds  Medication Sig   acetaminophen (TYLENOL) 650 MG CR tablet Take 1,300 mg by mouth every 8 (eight) hours as needed for pain.   aspirin EC 81 MG tablet Take 1 tablet (81 mg total) by mouth daily.  glucose blood test strip 1 each by Other route in the morning, at noon, in the evening, and at bedtime.    hydrochlorothiazide (HYDRODIURIL) 12.5 MG tablet Take 12.5 mg by mouth daily.   insulin aspart (NOVOLOG FLEXPEN RELION) 100 UNIT/ML FlexPen Inject 5-40 Units into the skin See admin instructions. 3-5 times per day, and pen needles 1/day   losartan (COZAAR) 100 MG tablet Take 1 tablet (100 mg total) by mouth daily.   metoprolol succinate (TOPROL-XL) 50 MG 24 hr tablet Take 50 mg by mouth daily. Take with or immediately following a meal.   omeprazole (PRILOSEC)  40 MG capsule Take 40 mg by mouth daily.   oxymetazoline (AFRIN) 0.05 % nasal spray Place 1 spray into both nostrils 2 (two) times daily.   simvastatin (ZOCOR) 40 MG tablet Take 40 mg by mouth daily.   spironolactone (ALDACTONE) 25 MG tablet Take 1 tablet (25 mg total) by mouth daily.   tamsulosin (FLOMAX) 0.4 MG CAPS capsule Take 1 capsule (0.4 mg total) by mouth daily.   traMADol (ULTRAM) 50 MG tablet Take 1 tablet (50 mg total) by mouth every 6 (six) hours as needed for moderate pain.   Vitamin D, Ergocalciferol, (DRISDOL) 1.25 MG (50000 UT) CAPS capsule Take one tablet wkly     Allergies:    Lipitor [atorvastatin], Metformin, Rosuvastatin, Sulfa antibiotics, and Sulfonamide derivatives   Social History: Social History   Socioeconomic History   Marital status: Married    Spouse name: Not on file   Number of children: 1   Years of education: Not on file   Highest education level: Not on file  Occupational History    Employer: [employer]    Comment: Works for [employer]   Occupation: Engineering geologist  Tobacco Use   Smoking status: Former    Current packs/day: 0.00    Average packs/day: 1 pack/day for 10.0 years (10.0 ttl pk-yrs)    Types: Cigarettes    Start date: 1961    Quit date: 1971    Years since quitting: 53.5   Smokeless tobacco: Never  Vaping Use   Vaping status: Never Used  Substance and Sexual Activity   Alcohol use: Yes    Comment: maybe one drink per week   Drug use: Never   Sexual activity: Not Currently  Other Topics Concern   Not on file  Social History Narrative   Had 2 children. 1 is deceased   Social Determinants of Corporate investment banker Strain: Not on file  Food Insecurity: Not on file  Transportation Needs: Not on file  Physical Activity: Not on file  Stress: Not on file  Social Connections: Not on file     Family History: The patient's family history includes Cancer in his maternal grandmother and  paternal grandfather; Heart disease in his father; Stroke in his maternal grandfather.  ROS:   All other ROS reviewed and negative. Pertinent positives noted in the HPI.     EKGs/Labs/Other Studies Reviewed:   The following studies were personally reviewed by me today:  EKG:  EKG is ordered today.    EKG Interpretation Date/Time:  Friday July 06 2023 09:36:46 EDT Ventricular Rate:  69 PR Interval:  150 QRS Duration:  84 QT Interval:  404 QTC Calculation: 432 R Axis:   -12  Text Interpretation: Normal sinus rhythm Nonspecific T wave abnormality Confirmed by Lennie Odor ([id number]) on 07/06/2023 9:37:28 AM   Recent Labs: 08/18/2022: Magnesium 2.2 08/31/2022: ALT  17; BUN 16; Creatinine, Ser 1.17; Hemoglobin 12.4; Platelets 423; Potassium 4.1; Sodium 135 09/08/2022: BNP 253.1   Recent Lipid Panel    Component Value Date/Time   CHOL 127 09/08/2022 1107   TRIG 50 09/08/2022 1107   HDL 41 09/08/2022 1107   CHOLHDL 3.1 09/08/2022 1107   CHOLHDL 3 02/22/2018 1134   VLDL 7.6 02/22/2018 1134   LDLCALC 75 09/08/2022 1107    Physical Exam:   VS:  BP 114/68   Pulse 74   Ht 5\' 7"  (1.702 m)   Wt 204 lb (92.5 kg)   SpO2 91%   BMI 31.95 kg/m    Wt Readings from Last 3 Encounters:  07/06/23 204 lb (92.5 kg)  04/13/23 200 lb (90.7 kg)  12/01/22 206 lb 9.6 oz (93.7 kg)    General: Well nourished, well developed, in no acute distress Head: Atraumatic, normal size  Eyes: PEERLA, EOMI  Neck: Supple, no JVD Endocrine: No thryomegaly Cardiac: Normal S1, S2; RRR; no murmurs, rubs, or gallops Lungs: Clear to auscultation bilaterally, no wheezing, rhonchi or rales  Abd: Soft, nontender, no hepatomegaly  Ext: No edema, pulses 2+ Musculoskeletal: No deformities, BUE and BLE strength normal and equal Skin: Warm and dry, no rashes   Neuro: Alert and oriented to person, place, time, and situation, CNII-XII grossly intact, no focal deficits  Psych: Normal mood and affect   ASSESSMENT:    Antonio Patterson is a 72 y.o. male who presents for the following: 1. SOB (shortness of breath) on exertion   2. Coronary artery disease involving coronary bypass graft of native heart without angina pectoris   3. Mixed hyperlipidemia   4. Pulmonary hypertension, unspecified (HCC)   5. OSA (obstructive sleep apnea)     PLAN:   1. SOB (shortness of breath) on exertion -Suspect this could be due to pulmonary hypertension.  No signs of heart failure today.  I do not think he is volume overloaded.  I would like to recheck his echo to see what his pulmonary pressures are.  I would be concerned with severe sleep apnea and untreated sleep apnea that he may have worsening pulmonary hypertension.  This could definitely explain his shortness of breath.  No symptoms of angina.  EKG is unchanged.  Overall seems to be stable just still short of breath.  Blood pressure stable.  Volume status is quite acceptable.  2. Coronary artery disease involving coronary bypass graft of native heart without angina pectoris 3. Mixed hyperlipidemia -CAD status post CABG.  Now with worsening shortness of breath.  Symptoms have slightly improved since bypass surgery.  Repeat echo.  I do question pulmonary hypertension as an explanation.  He has untreated sleep apnea.  Currently on aspirin and Leqvio.  Recheck lipids today.  He is on simvastatin but does have myalgias with other medications.  We will repeat his echo and follow-up after this  4. Pulmonary hypertension, unspecified (HCC) -Likely related to untreated sleep apnea.  He had a right heart cath before surgery.  This showed a mean PA pressure of 26 mmHg and a pulmonary capillary wedge pressure of 10.  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  Suspect this is related to sleep apnea.  Repeat echo pending.  I have encouraged him to follow-up with sleep medicine.  5. OSA (obstructive sleep  apnea) -Reach out to sleep medicine.  Will see where we are with his CPAP referral.  He has  severe sleep apnea.      Disposition: Return in about 6 months (around 01/06/2024).  Medication Adjustments/Labs and Tests Ordered: Current medicines are reviewed at length with the patient today.  Concerns regarding medicines are outlined above.  Orders Placed This Encounter  Procedures   Lipid panel   EKG 12-Lead   ECHOCARDIOGRAM COMPLETE   No orders of the defined types were placed in this encounter.  Patient Instructions     Testing/Procedures:  Your physician has requested that you have an echocardiogram. Echocardiography is a painless test that uses sound waves to create images of your heart. It provides your doctor with information about the size and shape of your heart and how well your heart's chambers and valves are working. This procedure takes approximately one hour. There are no restrictions for this procedure. Please do NOT wear cologne, perfume, aftershave, or lotions (deodorant is allowed). Please arrive 15 minutes prior to your appointment time. [address]   Follow-Up: At [facility], you and your health needs are our priority.  As part of our continuing mission to provide you with exceptional heart care, we have created designated Provider Care Teams.  These Care Teams include your primary Cardiologist (physician) and Advanced Practice Providers (APPs -  Physician Assistants and Nurse Practitioners) who all work together to provide you with the care you need, when you need it.  We recommend signing up for the patient portal called "MyChart".  Sign up information is provided on this After Visit Summary.  MyChart is used to connect with patients for Virtual Visits (Telemedicine).  Patients are able to view lab/test results, encounter notes, upcoming appointments, etc.  Non-urgent messages can be sent to your provider as well.   To learn more about what  you can do with MyChart, go to [email or web address].    Your next appointment:   6 month(s)  Provider:   Reatha Harps, MD     Other Instructions MAKE FOLLOW UP SLEEP APPOINTMENT WITH DR Gloris Manchester TURNER   Time Spent with Patient: I have spent a total of 35 minutes with patient reviewing hospital notes, telemetry, EKGs, labs and examining the patient as well as establishing an assessment and plan that was discussed with the patient.  > 50% of time was spent in direct patient care.  Signed, Lenna Gilford. Flora Lipps, MD, [facility]  [facility]  [address] [place], [place] [address] [phone]  07/06/2023 2:47 PM

## 2023-07-06 ENCOUNTER — Encounter: Payer: Self-pay | Admitting: Cardiovascular Disease

## 2023-07-06 ENCOUNTER — Ambulatory Visit: Payer: Medicare Other | Attending: Cardiovascular Disease | Admitting: Cardiovascular Disease

## 2023-07-06 VITALS — BP 114/68 | HR 74 | Ht 67.0 in | Wt 204.0 lb

## 2023-07-06 DIAGNOSIS — I272 Pulmonary hypertension, unspecified: Secondary | ICD-10-CM | POA: Diagnosis not present

## 2023-07-06 DIAGNOSIS — R0602 Shortness of breath: Secondary | ICD-10-CM | POA: Insufficient documentation

## 2023-07-06 DIAGNOSIS — G4733 Obstructive sleep apnea (adult) (pediatric): Secondary | ICD-10-CM | POA: Diagnosis present

## 2023-07-06 DIAGNOSIS — E782 Mixed hyperlipidemia: Secondary | ICD-10-CM | POA: Insufficient documentation

## 2023-07-06 DIAGNOSIS — I25810 Atherosclerosis of coronary artery bypass graft(s) without angina pectoris: Secondary | ICD-10-CM | POA: Diagnosis not present

## 2023-07-06 LAB — LIPID PANEL
Chol/HDL Ratio: 1.9 ratio (ref 0.0–5.0)
Cholesterol, Total: 102 mg/dL (ref 100–199)
HDL: 53 mg/dL (ref >39)
LDL Chol Calc (NIH): 37 mg/dL (ref 0–99)
Triglycerides: 47 mg/dL (ref 0–149)
VLDL Cholesterol Cal: 12 mg/dL (ref 5–40)

## 2023-07-06 NOTE — Patient Instructions (Signed)
    Testing/Procedures:  Your physician has requested that you have an echocardiogram. Echocardiography is a painless test that uses sound waves to create images of your heart. It provides your doctor with information about the size and shape of your heart and how well your heart's chambers and valves are working. This procedure takes approximately one hour. There are no restrictions for this procedure. Please do NOT wear cologne, perfume, aftershave, or lotions (deodorant is allowed). Please arrive 15 minutes prior to your appointment time. [address]   Follow-Up: At [facility], you and your health needs are our priority.  As part of our continuing mission to provide you with exceptional heart care, we have created designated Provider Care Teams.  These Care Teams include your primary Cardiologist (physician) and Advanced Practice Providers (APPs -  Physician Assistants and Nurse Practitioners) who all work together to provide you with the care you need, when you need it.  We recommend signing up for the patient portal called "MyChart".  Sign up information is provided on this After Visit Summary.  MyChart is used to connect with patients for Virtual Visits (Telemedicine).  Patients are able to view lab/test results, encounter notes, upcoming appointments, etc.  Non-urgent messages can be sent to your provider as well.   To learn more about what you can do with MyChart, go to [email or web address].    Your next appointment:   6 month(s)  Provider:   Reatha Harps, MD     Other Instructions MAKE FOLLOW UP SLEEP APPOINTMENT WITH DR Armanda Magic

## 2023-07-13 ENCOUNTER — Ambulatory Visit (INDEPENDENT_AMBULATORY_CARE_PROVIDER_SITE_OTHER): Payer: Medicare Other

## 2023-07-13 VITALS — BP 111/64 | HR 71 | Temp 97.9°F | Resp 18 | Ht 67.0 in | Wt 206.0 lb

## 2023-07-13 DIAGNOSIS — E785 Hyperlipidemia, unspecified: Secondary | ICD-10-CM | POA: Diagnosis not present

## 2023-07-13 MED ORDER — INCLISIRAN SODIUM 284 MG/1.5ML ~~LOC~~ SOSY
284.0000 mg | PREFILLED_SYRINGE | Freq: Once | SUBCUTANEOUS | Status: AC
  Administered 2023-07-13: 284 mg via SUBCUTANEOUS

## 2023-07-13 NOTE — Progress Notes (Signed)
Diagnosis: Hyperlipidemia  Provider:  Chilton Greathouse MD  Procedure: Injection  Leqvio (inclisiran), Dose: 284 mg, Site: subcutaneous, Number of injections: 1  Post Care: Observation period completed  Discharge: Condition: Good, Destination: Home . AVS Provided  Performed by:  Forrest Moron, RN   Remote note written for Pernell Dupre, RN

## 2023-07-27 ENCOUNTER — Telehealth: Payer: Self-pay

## 2023-07-27 ENCOUNTER — Ambulatory Visit (HOSPITAL_COMMUNITY): Payer: Medicare Other | Attending: Cardiology

## 2023-07-27 DIAGNOSIS — G4733 Obstructive sleep apnea (adult) (pediatric): Secondary | ICD-10-CM

## 2023-07-27 DIAGNOSIS — R0602 Shortness of breath: Secondary | ICD-10-CM | POA: Diagnosis not present

## 2023-07-27 LAB — ECHOCARDIOGRAM COMPLETE
Area-P 1/2: 3.31 cm2
Calc EF: 47.2 %
S' Lateral: 2.7 cm
Single Plane A2C EF: 49 %
Single Plane A4C EF: 51.5 %

## 2023-07-27 NOTE — Telephone Encounter (Signed)
-----   Message from Armanda Magic sent at 05/28/2023  1:54 PM EDT ----- Please let patient know that they have sleep apnea.  Recommend therapeutic CPAP titration for treatment of patient's sleep disordered breathing.  If unable to perform an in lab titration then initiate ResMed auto CPAP from 4 to 15cm H2O with heated humidity and mask of choice and overnight pulse ox on CPAP.

## 2023-07-27 NOTE — Telephone Encounter (Signed)
Patient notified of sleep study results an recommendations. Patient is interested in Oral device but is willing to try CPAP in order to qualify for Oral device. All questions were answered and patient verbalized understanding. CPAP Titration ordered today 07/27/23

## 2023-12-05 ENCOUNTER — Other Ambulatory Visit: Payer: Self-pay | Admitting: Cardiovascular Disease

## 2024-01-03 ENCOUNTER — Telehealth: Payer: Self-pay

## 2024-01-03 NOTE — Telephone Encounter (Signed)
 Auth Submission: no auth needed Site of care: Site of care: [facility] Payer: medicare [employer] [place] Medication & CPT/J Code(s) submitted: Leqvio  (Inclisiran) J1306 Route of submission (phone, fax, portal):  Phone # Fax # Auth type: Buy/Bill Units/visits requested: 284mg  x 2 doses Reference number:  Approval from: 03/28/23 to 01/24/25

## 2024-01-18 ENCOUNTER — Ambulatory Visit (INDEPENDENT_AMBULATORY_CARE_PROVIDER_SITE_OTHER): Payer: Medicare Other

## 2024-01-18 VITALS — BP 128/77 | HR 73 | Temp 98.1°F | Resp 20 | Ht 67.0 in | Wt 205.6 lb

## 2024-01-18 DIAGNOSIS — E785 Hyperlipidemia, unspecified: Secondary | ICD-10-CM

## 2024-01-18 MED ORDER — INCLISIRAN SODIUM 284 MG/1.5ML ~~LOC~~ SOSY
284.0000 mg | PREFILLED_SYRINGE | Freq: Once | SUBCUTANEOUS | Status: AC
Start: 2024-01-18 — End: 2024-01-18
  Administered 2024-01-18: 284 mg via SUBCUTANEOUS
  Filled 2024-01-18: qty 1.5

## 2024-01-18 NOTE — Progress Notes (Signed)
Diagnosis: Hyperlipidemia  Provider:  Chilton Greathouse MD  Procedure: Injection  Leqvio (inclisiran), Dose: 284 mg, Site: subcutaneous, Number of injections: 1  Injection Site(s): Right arm  Post Care:  right arm injection  Discharge: Condition: Good, Destination: Home . AVS Declined  Performed by:  Rico Ala, LPN

## 2024-03-19 ENCOUNTER — Ambulatory Visit (INDEPENDENT_AMBULATORY_CARE_PROVIDER_SITE_OTHER): Admitting: Pulmonary Disease

## 2024-03-19 ENCOUNTER — Encounter: Payer: Self-pay | Admitting: Pulmonary Disease

## 2024-03-19 VITALS — BP 129/80 | HR 57 | Ht 67.0 in | Wt 205.8 lb

## 2024-03-19 DIAGNOSIS — Z87891 Personal history of nicotine dependence: Secondary | ICD-10-CM

## 2024-03-19 DIAGNOSIS — J479 Bronchiectasis, uncomplicated: Secondary | ICD-10-CM | POA: Diagnosis not present

## 2024-03-19 DIAGNOSIS — I272 Pulmonary hypertension, unspecified: Secondary | ICD-10-CM

## 2024-03-19 DIAGNOSIS — G4733 Obstructive sleep apnea (adult) (pediatric): Secondary | ICD-10-CM | POA: Diagnosis not present

## 2024-03-19 NOTE — Progress Notes (Signed)
 Synopsis: Referred in March 2025 for bronchiectasis  Subjective:   PATIENT ID: Antonio Patterson GENDER: male DOB: October 26, 1951, MRN: [id number]   HPI  Chief Complaint  Patient presents with   Consult    Pt states he gets injections here and the Dr is retired now that he used to see   Antonio Patterson is a 73 year old male, former smoker with DMII, hiatal hernia, hypertension and allergic rhinitis who is referred to pulmonary clinic for abnormal CT scan with bronchiectasis.   He has experienced shortness of breath for the past three years, with episodes of lightheadedness and near syncope when coughing or laughing hard. The shortness of breath occurs with exertion, such as walking thirty-five yards, and feels like insufficient oxygen intake. No wheezing or significant seasonal changes in breathing, although mild allergies occur in July, managed with Claritin D. He has used an albuterol inhaler for about a week without noticeable improvement.   He has a significant history of smoking, having smoked two to three packs per day for about five years, with a total of twenty years of exposure to smoke, including welding fumes. He quit smoking approximately forty years ago. Currently, he is not exposed to dust or chemicals in his occupation as a Architectural technologist.   A CT scan of the chest in January showed bronchiectasis and some scarring in the lower lungs. He also had a breathing test about a month ago at primary care.  He has a history of sleep apnea diagnosed last year with a home test showing 38 apneic events per hour. He does not have a CPAP device at home as he did not want to start it yet.  He experiences joint stiffness and has been told he has arthritis, with symptoms in his hands and shoulders. His sister has an autoimmune disease affecting her digestive system.   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   UNSPECIFIED PERIPHERAL VASCULAR DISEASE 11/13/2008     Family History  Problem Relation Age of Onset   Heart disease Father    Cancer Maternal Grandmother        breast   Stroke Maternal Grandfather    Cancer Paternal Grandfather      Social History   Socioeconomic History   Marital status: Married    Spouse name: Not on file   Number of children: 1   Years of education: Not on file   Highest education level: Not on file  Occupational History    Employer: [employer]    Comment: Works for [employer]   Occupation: Engineering geologist  Tobacco Use   Smoking status: Former    Current packs/day: 0.00    Average packs/day: 1 pack/day for 10.0 years (10.0 ttl pk-yrs)    Types: Cigarettes    Start date: 1961    Quit date: 1971    Years since quitting: 54.2   Smokeless tobacco: Never  Vaping Use   Vaping status: Never Used  Substance and Sexual Activity  Alcohol use: Yes    Comment: maybe one drink per week   Drug use: Never   Sexual activity: Not Currently  Other Topics Concern   Not on file  Social History Narrative   Had 2 children. 1 is deceased   Social Drivers of Corporate investment banker Strain: Not on file  Food Insecurity: Not on file  Transportation Needs: Not on file  Physical Activity: Not on file  Stress: Not on file  Social Connections: Not on file  Intimate Partner Violence: Not on file     Allergies  Allergen Reactions   Lipitor [Atorvastatin]     Muscle pain   Metformin Diarrhea   Rosuvastatin     Muscle pain   Sulfa Antibiotics Rash    Unknown reaction    Sulfonamide Derivatives Rash    Unknown reaction     Outpatient Medications Prior to Visit  Medication Sig Dispense Refill   acetaminophen (TYLENOL) 650 MG CR tablet Take 1,300 mg by mouth every 8 (eight) hours as needed for pain.     albuterol (VENTOLIN HFA) 108 (90 Base) MCG/ACT inhaler SMARTSIG:1 Puff(s) By Mouth Every 6 Hours PRN     aspirin EC 81 MG tablet Take 1 tablet (81 mg total) by mouth daily.     glucose blood test strip 1 each by Other route in the morning, at noon, in the evening, and at bedtime.      hydrochlorothiazide (HYDRODIURIL) 12.5 MG tablet Take 12.5 mg by mouth daily.     insulin aspart (NOVOLOG FLEXPEN RELION) 100 UNIT/ML FlexPen Inject 5-40 Units into the skin See admin instructions. 3-5 times per day, and pen needles 1/day 45 mL 11   losartan (COZAAR) 100 MG tablet Take 1 tablet (100 mg total) by mouth daily. 90 tablet 3   metoprolol succinate (TOPROL-XL) 50 MG 24 hr tablet Take 50 mg by mouth daily. Take with or immediately following a meal.     omeprazole (PRILOSEC) 40 MG capsule Take 40 mg by mouth daily.     oxymetazoline (AFRIN) 0.05 % nasal spray Place 1 spray into both nostrils 2 (two) times daily.     simvastatin (ZOCOR) 40 MG tablet Take 40 mg by mouth daily.     spironolactone (ALDACTONE) 25 MG tablet Take 1 tablet by mouth once daily 90 tablet 1   tamsulosin (FLOMAX) 0.4 MG CAPS capsule Take 1 capsule (0.4 mg total) by mouth daily. 90 capsule 1   traMADol (ULTRAM) 50 MG tablet Take 1 tablet (50 mg total) by mouth every 6 (six) hours as needed for moderate pain. 28 tablet 0   Vitamin D, Ergocalciferol, (DRISDOL) 1.25 MG (50000 UT) CAPS capsule Take one tablet wkly 12 capsule 3   No facility-administered medications prior to visit.   Review of Systems  Constitutional:  Negative for chills, fever, malaise/fatigue and weight loss.  HENT:  Negative for congestion, sinus pain and sore throat.   Eyes: Negative.   Respiratory:  Positive for cough and  shortness of breath. Negative for hemoptysis, sputum production and wheezing.   Cardiovascular:  Negative for chest pain, palpitations, orthopnea, claudication and leg swelling.  Gastrointestinal:  Negative for abdominal pain, heartburn, nausea and vomiting.  Genitourinary: Negative.   Musculoskeletal:  Negative for joint pain and myalgias.  Skin:  Negative for rash.  Neurological:  Negative for weakness.  Endo/Heme/Allergies: Negative.   Psychiatric/Behavioral: Negative.     Objective:   Vitals:   03/19/24 1304  BP: 129/80  Pulse: Marland Kitchen)  57  SpO2: 95%  Weight: 205 lb 12.8 oz (93.4 kg)  Height: 5\' 7"  (1.702 m)   Physical Exam Constitutional:      General: He is not in acute distress.    Appearance: Normal appearance.  Eyes:     General: No scleral icterus.    Conjunctiva/sclera: Conjunctivae normal.  Cardiovascular:     Rate and Rhythm: Normal rate and regular rhythm.  Pulmonary:     Breath sounds: Rales (mild, bibasilar) present. No wheezing or rhonchi.  Musculoskeletal:     Right lower leg: No edema.     Left lower leg: No edema.  Skin:    General: Skin is warm and dry.  Neurological:     General: No focal deficit present.    CBC    Component Value Date/Time   WBC 8.9 08/31/2022 1255   RBC 4.33 08/31/2022 1255   HGB 12.4 (L) 08/31/2022 1255   HGB 14.9 07/07/2022 1409   HCT 38.2 (L) 08/31/2022 1255   HCT 46.3 07/07/2022 1409   PLT 423 (H) 08/31/2022 1255   PLT 262 07/07/2022 1409   MCV 88.2 08/31/2022 1255   MCV 88 07/07/2022 1409   MCH 28.6 08/31/2022 1255   MCHC 32.5 08/31/2022 1255   RDW 13.9 08/31/2022 1255   RDW 13.6 07/07/2022 1409   LYMPHSABS 1.3 08/06/2020 0743   LYMPHSABS 2.5 02/28/2019 0914   MONOABS 0.6 08/06/2020 0743   EOSABS 0.1 08/06/2020 0743   EOSABS 0.3 02/28/2019 0914   BASOSABS 0.0 08/06/2020 0743   BASOSABS 0.1 02/28/2019 0914      Latest Ref Rng & Units 08/31/2022   12:55 PM 08/21/2022    6:11 AM 08/20/2022    2:03 AM  BMP   Glucose 70 - 99 mg/dL 161  096  045   BUN 8 - 23 mg/dL 16  15  14    Creatinine 0.61 - 1.24 mg/dL 4.09  8.11  9.14   Sodium 135 - 145 mmol/L 135  139  135   Potassium 3.5 - 5.1 mmol/L 4.1  4.0  3.7   Chloride 98 - 111 mmol/L 103  103  99   CO2 22 - 32 mmol/L 24  27  28    Calcium 8.9 - 10.3 mg/dL 9.4  9.0  8.8     Chest imaging: CT Chest 05/18/23 No focal consolidations. No suspicious pulmonary nodules. Bibasilar subsegmental atelectasis. Mild diffuse bronchiectasis. Scattered peripheral tree-in-bud densities may reflect atypical pneumonia versus postinflammatory changes.   PFT:     No data to display          Labs:  Path:  Echo:  Heart Catheterization:     Assessment & Plan:   Bronchiectasis without complication (HCC) - Plan: Pulmonary Function Test  Discussion: Antonio Patterson is a 73 year old male, former smoker with DMII, hiatal hernia, hypertension and allergic rhinitis who is referred to pulmonary clinic for abnormal CT scan with bronchiectasis.   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  - schedule PFTs - Provide sample of Breztri inhaler for trial, to be used 2 puffs twice daily - Evaluate response to [employer] inhaler before considering prescription.  Sleep Apnea Diagnosed with sleep apnea with AHI of 38. CPAP recommended to reduce dyspnea symptoms related to pulmonary hypertension - Order CPAP machine with nasal pillow mask fitting. Will need compliance visit. - Discussed potential for Inspire device if CPAP is not tolerated.  Pulmonary Hypertension Right  heart dilation noted on echocardiogram, likely due to increased pulmonary pressures from untreated sleep apnea, contributing to dyspnea.  - Address sleep apnea with CPAP to reduce pulmonary pressures and assess impact on right heart dilation.  Follow up in 2 months  Melody Comas, MD [facility] Office:  [phone]   Current Outpatient Medications:    acetaminophen (TYLENOL) 650 MG CR tablet, Take 1,300 mg by mouth every 8 (eight) hours as needed for pain., Disp: , Rfl:    albuterol (VENTOLIN HFA) 108 (90 Base) MCG/ACT inhaler, SMARTSIG:1 Puff(s) By Mouth Every 6 Hours PRN, Disp: , Rfl:    aspirin EC 81 MG tablet, Take 1 tablet (81 mg total) by mouth daily., Disp: , Rfl:    glucose blood test strip, 1 each by Other route in the morning, at noon, in the evening, and at bedtime. , Disp: , Rfl:    hydrochlorothiazide (HYDRODIURIL) 12.5 MG tablet, Take 12.5 mg by mouth daily., Disp: , Rfl:    insulin aspart (NOVOLOG FLEXPEN RELION) 100 UNIT/ML FlexPen, Inject 5-40 Units into the skin See admin instructions. 3-5 times per day, and pen needles 1/day, Disp: 45 mL, Rfl: 11   losartan (COZAAR) 100 MG tablet, Take 1 tablet (100 mg total) by mouth daily., Disp: 90 tablet, Rfl: 3   metoprolol succinate (TOPROL-XL) 50 MG 24 hr tablet, Take 50 mg by mouth daily. Take with or immediately following a meal., Disp: , Rfl:    omeprazole (PRILOSEC) 40 MG capsule, Take 40 mg by mouth daily., Disp: , Rfl:    oxymetazoline (AFRIN) 0.05 % nasal spray, Place 1 spray into both nostrils 2 (two) times daily., Disp: , Rfl:    simvastatin (ZOCOR) 40 MG tablet, Take 40 mg by mouth daily., Disp: , Rfl:    spironolactone (ALDACTONE) 25 MG tablet, Take 1 tablet by mouth once daily, Disp: 90 tablet, Rfl: 1   tamsulosin (FLOMAX) 0.4 MG CAPS capsule, Take 1 capsule (0.4 mg total) by mouth daily., Disp: 90 capsule, Rfl: 1   traMADol (ULTRAM) 50 MG tablet, Take 1 tablet (50 mg total) by mouth every 6 (six) hours as needed for moderate pain., Disp: 28 tablet, Rfl: 0   Vitamin D, Ergocalciferol, (DRISDOL) 1.25 MG (50000 UT) CAPS capsule, Take one tablet wkly, Disp: 12 capsule, Rfl: 3

## 2024-03-19 NOTE — Patient Instructions (Addendum)
 We will order you an auto-titrating CPAP machine 5-15cmH2O with humidification and nasal pillow face mask fitting session  Follow up 1 month after starting CPAP to monitor the compliance report  Continue albuterol inhaler as needed  We will schedule you for pulmonary function tests  Try breztri inhaler 2 puffs twice daily - rinse mouth out after each use - let us know if you would like to continue on this inhaler in the future  Follow up in 2 months

## 2024-04-02 ENCOUNTER — Telehealth: Payer: Self-pay | Admitting: Pulmonary Disease

## 2024-04-02 NOTE — Telephone Encounter (Signed)
 CMN received from [facility] for CPAP/BiPAP supplies.

## 2024-04-04 NOTE — Telephone Encounter (Signed)
 CMN faxed successfully and signed.

## 2024-04-05 LAB — LAB REPORT - SCANNED
A1c: 6.8
EGFR: 64.8

## 2024-04-07 ENCOUNTER — Encounter: Payer: Self-pay | Admitting: Cardiovascular Disease

## 2024-04-10 NOTE — Progress Notes (Signed)
 Cardiology Office Note:  .   Date:  04/11/2024  ID:  [name], DOB 08-27-1951, MRN [id number] PCP: Silvano Angeline FALCON, NP  [facility] Providers Cardiologist:  Darryle ONEIDA Decent, MD    History of Present Illness: .    Chief Complaint  Patient presents with   Follow-up    Antonio Patterson is a 73 y.o. male with history of CAD s/p CABG, HLD, HTH, DM who presents for follow-up.   History of Present Illness   Antonio Patterson is a 73 year old male with CAD, status post CABG, diabetes, hypertension, and hyperlipidemia who presents for follow-up.  He denies chest pain but is dissatisfied with his breathing after exertion, attributing it to aging and his heart condition. He is awaiting a CPAP machine next Friday, hoping it will help with his symptoms. Has severe OSA.   He is on simvastatin  and Leqvio  for hyperlipidemia, with his most recent LDL cholesterol at 33 mg/dL. He confirms that he is not on any injectable cholesterol medications other than Leqvio .  For diabetes, his A1c is currently 6.8%, which is higher than previous values but still within an acceptable range.  Regarding hypertension, his blood pressure tends to be low in the mornings, likely due to taking his medication early. No dizziness or lightheadedness. He is on metoprolol , spironolactone , and losartan .  In terms of physical activity, he is not engaging in regular exercise but mentions that his work involves some physical activity, such as walking around job sites. 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          Problem List CAD -CABG x 3 08/17/2022 2. Mild to moderate pHTN -normal PCWP -mPAP 26 mmHG 3. Severe OSA 4. DM -A1c 6.8 5. HLD  -T chol 90, TG 61, HDL 45, LDL 33 6. SVT  -08/31/2022 7. HTN    ROS: All other ROS reviewed and negative. Pertinent positives noted in the HPI.     Studies Reviewed: SABRA   EKG Interpretation Date/Time:  Friday April 11 2024 09:06:34  EDT Ventricular Rate:  81 PR Interval:  140 QRS Duration:  90 QT Interval:  368 QTC Calculation: 427 R Axis:   4  Text Interpretation: Sinus rhythm with occasional Premature ventricular complexes Confirmed by Decent Darryle [id number]) on 04/11/2024 9:11:25 AM   Physical Exam:   VS:  BP 90/62   Pulse 81   Ht 5' 7 (1.702 m)   Wt 204 lb (92.5 kg)   SpO2 99%   BMI 31.95 kg/m    Wt Readings from Last 3 Encounters:  04/11/24 204 lb (92.5 kg)  03/19/24 205 lb 12.8 oz (93.4 kg)  01/18/24 205 lb 9.6 oz (93.3 kg)    GEN: Well nourished, well developed in no acute distress NECK: No JVD; No carotid bruits CARDIAC: RRR, no murmurs, rubs, gallops RESPIRATORY:  Clear to auscultation without rales, wheezing or rhonchi  ABDOMEN: Soft, non-tender, non-distended EXTREMITIES:  No edema; No deformity  ASSESSMENT AND PLAN: .   Assessment and Plan    Coronary Artery Disease (CAD) Status post CABG, asymptomatic, EKG shows sinus rhythm with single PVC, LDL well-controlled at 33 mg/dL with simvastatin  and Leqvio . - Continue aspirin  81 mg daily. - Continue simvastatin  40 mg daily. - Continue Leqvio  (inclisiran) as per current regimen.  Hypertension Blood pressure low at 90/62 mmHg, likely due to antihypertensive regimen. Hydrochlorothiazide  may contribute to hypotension. - Stop hydrochlorothiazide . - Continue losartan  100 mg daily. -  Continue metoprolol  succinate 50 mg daily. - Continue spironolactone  25 mg daily. - Monitor blood pressure and keep a log. - Consider reducing losartan  if blood pressure remains low.   SVT - controlled on metoprolol   Sleep Apnea Symptoms suggestive of sleep apnea, awaiting CPAP therapy initiation. Anticipated improvement in energy levels and exertional dyspnea with CPAP use. - Initiate CPAP therapy next Friday.   DM - A1c 6.8; per PCP              Follow-up: Return in about 1 year (around 04/11/2025).  Signed, Darryle DASEN. Barbaraann, MD, [facility]   [facility]  [address] [place], [place] [address] [phone]  9:21 AM

## 2024-04-11 ENCOUNTER — Encounter: Payer: Self-pay | Admitting: Cardiovascular Disease

## 2024-04-11 ENCOUNTER — Ambulatory Visit: Payer: Medicare Other | Attending: Cardiovascular Disease | Admitting: Cardiovascular Disease

## 2024-04-11 VITALS — BP 90/62 | HR 81 | Ht 67.0 in | Wt 204.0 lb

## 2024-04-11 DIAGNOSIS — G4733 Obstructive sleep apnea (adult) (pediatric): Secondary | ICD-10-CM

## 2024-04-11 DIAGNOSIS — E782 Mixed hyperlipidemia: Secondary | ICD-10-CM

## 2024-04-11 DIAGNOSIS — I471 Supraventricular tachycardia, unspecified: Secondary | ICD-10-CM

## 2024-04-11 DIAGNOSIS — I25810 Atherosclerosis of coronary artery bypass graft(s) without angina pectoris: Secondary | ICD-10-CM | POA: Diagnosis present

## 2024-04-11 DIAGNOSIS — I150 Renovascular hypertension: Secondary | ICD-10-CM

## 2024-04-11 NOTE — Patient Instructions (Signed)
 Medication Instructions:  - STOP HYDROCHLOROTHIAZIDE      *If you need a refill on your cardiac medications before your next appointment, please call your pharmacy*   Lab Work: NONE    If you have labs (blood work) drawn today and your tests are completely normal, you will receive your results only by: MyChart Message (if you have MyChart) OR A paper copy in the mail If you have any lab test that is abnormal or we need to change your treatment, we will call you to review the results.   Testing/Procedures: NONE    Follow-Up: At [facility], you and your health needs are our priority.  As part of our continuing mission to provide you with exceptional heart care, we have created designated Provider Care Teams.  These Care Teams include your primary Cardiologist (physician) and Advanced Practice Providers (APPs -  Physician Assistants and Nurse Practitioners) who all work together to provide you with the care you need, when you need it.  We recommend signing up for the patient portal called "MyChart".  Sign up information is provided on this After Visit Summary.  MyChart is used to connect with patients for Virtual Visits (Telemedicine).  Patients are able to view lab/test results, encounter notes, upcoming appointments, etc.  Non-urgent messages can be sent to your provider as well.   To learn more about what you can do with MyChart, go to [email or web address].    Your next appointment:   1 year(s)  The format for your next appointment:   In Person  Provider:   Lawana Pray, FNP, Marcie Sever, PA-C, Callie Goodrich, PA-C, Kathleen Johnson, PA-C, Hao Meng, PA-C, Marlana Silvan, NP, or Katlyn West, NP       Other Instructions

## 2024-05-31 ENCOUNTER — Other Ambulatory Visit: Payer: Self-pay | Admitting: Cardiovascular Disease

## 2024-06-03 ENCOUNTER — Other Ambulatory Visit: Payer: Self-pay | Admitting: Cardiovascular Disease

## 2024-06-19 ENCOUNTER — Encounter (HOSPITAL_COMMUNITY): Payer: Self-pay

## 2024-06-19 ENCOUNTER — Emergency Department (HOSPITAL_COMMUNITY)
Admission: EM | Admit: 2024-06-19 | Discharge: 2024-06-19 | Discharge status: Against Medical Advice | Attending: Emergency Medicine | Admitting: Emergency Medicine

## 2024-06-19 ENCOUNTER — Other Ambulatory Visit: Payer: Self-pay

## 2024-06-19 DIAGNOSIS — Z794 Long term (current) use of insulin: Secondary | ICD-10-CM | POA: Diagnosis not present

## 2024-06-19 DIAGNOSIS — I2510 Atherosclerotic heart disease of native coronary artery without angina pectoris: Secondary | ICD-10-CM | POA: Insufficient documentation

## 2024-06-19 DIAGNOSIS — Z5329 Procedure and treatment not carried out because of patient's decision for other reasons: Secondary | ICD-10-CM | POA: Diagnosis not present

## 2024-06-19 DIAGNOSIS — Z951 Presence of aortocoronary bypass graft: Secondary | ICD-10-CM | POA: Insufficient documentation

## 2024-06-19 DIAGNOSIS — R5383 Other fatigue: Secondary | ICD-10-CM | POA: Diagnosis present

## 2024-06-19 DIAGNOSIS — E119 Type 2 diabetes mellitus without complications: Secondary | ICD-10-CM | POA: Diagnosis not present

## 2024-06-19 DIAGNOSIS — N179 Acute kidney failure, unspecified: Secondary | ICD-10-CM | POA: Insufficient documentation

## 2024-06-19 DIAGNOSIS — Z7982 Long term (current) use of aspirin: Secondary | ICD-10-CM | POA: Insufficient documentation

## 2024-06-19 DIAGNOSIS — I10 Essential (primary) hypertension: Secondary | ICD-10-CM | POA: Diagnosis not present

## 2024-06-19 LAB — CBC WITH DIFFERENTIAL/PLATELET
Abs Immature Granulocytes: 0.03 10*3/uL (ref 0.00–0.07)
Basophils Absolute: 0.1 10*3/uL (ref 0.0–0.1)
Basophils Relative: 1 %
Eosinophils Absolute: 0.5 10*3/uL (ref 0.0–0.5)
Eosinophils Relative: 6 %
HCT: 40.8 % (ref 39.0–52.0)
Hemoglobin: 14.2 g/dL (ref 13.0–17.0)
Immature Granulocytes: 0 %
Lymphocytes Relative: 28 %
Lymphs Abs: 2.6 10*3/uL (ref 0.7–4.0)
MCH: 29 pg (ref 26.0–34.0)
MCHC: 34.8 g/dL (ref 30.0–36.0)
MCV: 83.3 fL (ref 80.0–100.0)
Monocytes Absolute: 1 10*3/uL (ref 0.1–1.0)
Monocytes Relative: 11 %
Neutro Abs: 5.2 10*3/uL (ref 1.7–7.7)
Neutrophils Relative %: 54 %
Platelets: 251 10*3/uL (ref 150–400)
RBC: 4.9 MIL/uL (ref 4.22–5.81)
RDW: 13.2 % (ref 11.5–15.5)
WBC: 9.5 10*3/uL (ref 4.0–10.5)
nRBC: 0 % (ref 0.0–0.2)

## 2024-06-19 LAB — COMPREHENSIVE METABOLIC PANEL WITH GFR
ALT: 20 U/L (ref 0–44)
AST: 25 U/L (ref 15–41)
Albumin: 4 g/dL (ref 3.5–5.0)
Alkaline Phosphatase: 61 U/L (ref 38–126)
Anion gap: 13 (ref 5–15)
BUN: 46 mg/dL — ABNORMAL HIGH (ref 8–23)
CO2: 22 mmol/L (ref 22–32)
Calcium: 9.7 mg/dL (ref 8.9–10.3)
Chloride: 94 mmol/L — ABNORMAL LOW (ref 98–111)
Creatinine, Ser: 2.03 mg/dL — ABNORMAL HIGH (ref 0.61–1.24)
GFR, Estimated: 34 mL/min — ABNORMAL LOW (ref >60)
Glucose, Bld: 80 mg/dL (ref 70–99)
Potassium: 5.1 mmol/L (ref 3.5–5.1)
Sodium: 129 mmol/L — ABNORMAL LOW (ref 135–145)
Total Bilirubin: 0.5 mg/dL (ref 0.0–1.2)
Total Protein: 6.4 g/dL — ABNORMAL LOW (ref 6.5–8.1)

## 2024-06-19 LAB — URINALYSIS, W/ REFLEX TO CULTURE (INFECTION SUSPECTED)
Bilirubin Urine: NEGATIVE
Glucose, UA: NEGATIVE mg/dL
Hgb urine dipstick: NEGATIVE
Ketones, ur: NEGATIVE mg/dL
Leukocytes,Ua: NEGATIVE
Nitrite: NEGATIVE
Protein, ur: NEGATIVE mg/dL
Specific Gravity, Urine: 1.008 (ref 1.005–1.030)
pH: 7 (ref 5.0–8.0)

## 2024-06-19 LAB — LIPASE, BLOOD: Lipase: 32 U/L (ref 11–51)

## 2024-06-19 LAB — TROPONIN I (HIGH SENSITIVITY)
Troponin I (High Sensitivity): 21 ng/L — ABNORMAL HIGH (ref <18)
Troponin I (High Sensitivity): 25 ng/L — ABNORMAL HIGH (ref <18)

## 2024-06-19 LAB — CBG MONITORING, ED: Glucose-Capillary: 100 mg/dL — ABNORMAL HIGH (ref 70–99)

## 2024-06-19 MED ORDER — SODIUM CHLORIDE 0.9 % IV BOLUS
1000.0000 mL | Freq: Once | INTRAVENOUS | Status: AC
Start: 2024-06-19 — End: 2024-06-19
  Administered 2024-06-19: 1000 mL via INTRAVENOUS

## 2024-06-19 MED ORDER — ONDANSETRON HCL 4 MG/2ML IJ SOLN: 4.0000 mg | Freq: Four times a day (QID) | INTRAMUSCULAR | Status: DC | PRN

## 2024-06-19 NOTE — Discharge Instructions (Signed)
 Please make sure to follow-up with your primary care provider tomorrow.  Please continue to drink copious amounts of water over the next 24 hours.

## 2024-06-19 NOTE — ED Notes (Signed)
 Pt states that he has been feeling weak x1 week. He believes he is dehydrated, has increased his fluid intake. Stopped Ozempic  06/10/24. Denies any chest pain, dizziness, shob, or headache. Notes new onset of nausea today. No acute distress.

## 2024-06-19 NOTE — ED Triage Notes (Signed)
 Pt went to PCP 2 days ago for fatigue and weakness. PCP stated pt was dehydrated. Pt started ozempic  May 2nd and stopped.

## 2024-06-19 NOTE — ED Provider Notes (Addendum)
 [place] EMERGENCY DEPARTMENT AT [facility] Provider Note   CSN: [id number] Arrival date & time: 06/19/24  9166     Patient presents with: Dehydration   Antonio Patterson is a 73 y.o. male with PMHx DM, HLD, HTN, CAD s/p CABG 2023 who presents to ED concerned for fatigue and generalized weakness x1 week. Patient endorses nausea and poor appetite since starting Ozempic  on May 2nd. Last dose of Ozempic  was around 6/17. Patient also noting increased acid reflux that eventually resolves with TUMS starting last week. Patient stating that he has been drinking Pedialyte and water to try and stay hydrated. Patient went to PCP 2 days ago who recommended patient come to ED for possible dehydration.   Denies fever, chest pain, dyspnea, cough, nausea, vomiting, diarrhea, dysuria, hematuria, hematochezia.    HPI     Prior to Admission medications   Medication Sig Start Date End Date Taking? Authorizing Provider  acetaminophen  (TYLENOL ) 650 MG CR tablet Take 1,300 mg by mouth every 8 (eight) hours as needed for pain.    [provider]  albuterol (VENTOLIN HFA) 108 (90 Base) MCG/ACT inhaler SMARTSIG:1 Puff(s) By Mouth Every 6 Hours PRN Patient not taking: Reported on 04/11/2024 01/25/24   [provider]  aspirin  EC 81 MG tablet Take 1 tablet (81 mg total) by mouth daily. 12/01/22   [name], MD  glucose blood test strip 1 each by Other route in the morning, at noon, in the evening, and at bedtime.     [provider]  INCLISIRAN SODIUM  [place] Inject into the skin.    [provider]  insulin  aspart (NOVOLOG  FLEXPEN RELION) 100 UNIT/ML FlexPen Inject 5-40 Units into the skin See admin instructions. 3-5 times per day, and pen needles 1/day 01/26/21   [name], MD  losartan  (COZAAR ) 100 MG tablet Take 1 tablet by mouth once daily 06/04/24   O'Neal, Darryle Ned, MD  metoprolol  succinate (TOPROL -XL) 50 MG 24 hr tablet Take 50 mg by mouth daily.  Take with or immediately following a meal.    [provider]  omeprazole  (PRILOSEC) 40 MG capsule Take 40 mg by mouth daily.    [provider]  oxymetazoline (AFRIN) 0.05 % nasal spray Place 1 spray into both nostrils 2 (two) times daily.    [provider]  simvastatin  (ZOCOR ) 40 MG tablet Take 40 mg by mouth daily.    [provider]  spironolactone  (ALDACTONE ) 25 MG tablet Take 1 tablet by mouth once daily 06/02/24   O'Neal, Darryle Ned, MD  tamsulosin  (FLOMAX ) 0.4 MG CAPS capsule Take 1 capsule (0.4 mg total) by mouth daily. 02/28/19   Opalski, Barnie, DO  traMADol  (ULTRAM ) 50 MG tablet Take 1 tablet (50 mg total) by mouth every 6 (six) hours as needed for moderate pain. 08/21/22   [name], PA-C  Vitamin D , Ergocalciferol , (DRISDOL ) 1.25 MG (50000 UT) CAPS capsule Take one tablet wkly Patient not taking: Reported on 04/11/2024 03/07/19   [name], DO    Allergies: Lipitor [atorvastatin ], Metformin , Rosuvastatin, Sulfa antibiotics, and Sulfonamide derivatives    Review of Systems  Gastrointestinal:  Positive for nausea.    Updated Vital Signs BP 115/71   Pulse 66   Temp 98.3 F (36.8 C) (Oral)   Resp (!) 22   Ht 5' 7 (1.702 m)   Wt 88.5 kg   SpO2 100%   BMI 30.54 kg/m   Physical Exam Vitals and nursing note reviewed.  Constitutional:      General: He is not in acute distress.    Appearance: He is not ill-appearing, toxic-appearing or diaphoretic.  HENT:     Head: Normocephalic and atraumatic.     Mouth/Throat:     Mouth: Mucous membranes are moist.   Eyes:     General: No scleral icterus.       Right eye: No discharge.        Left eye: No discharge.     Conjunctiva/sclera: Conjunctivae normal.    Cardiovascular:     Rate and Rhythm: Normal rate and regular rhythm.     Pulses: Normal pulses.     Heart sounds: Normal heart sounds. No murmur heard. Pulmonary:     Effort: Pulmonary effort is normal. No respiratory  distress.     Breath sounds: Normal breath sounds. No wheezing, rhonchi or rales.  Abdominal:     General: Abdomen is flat. There is no distension.     Palpations: Abdomen is soft. There is no mass.     Tenderness: There is no abdominal tenderness.   Musculoskeletal:     Right lower leg: No edema.     Left lower leg: No edema.   Skin:    General: Skin is warm and dry.     Findings: No rash.   Neurological:     General: No focal deficit present.     Mental Status: He is alert and oriented to person, place, and time. Mental status is at baseline.     Comments: GCS 15. Speech is goal oriented. No deficits appreciated to CN III-XII; symmetric eyebrow raise, no facial drooping, tongue midline. Patient has equal grip strength bilaterally with 5/5 strength against resistance in all major muscle groups bilaterally. Sensation to light touch intact. Patient moves extremities without ataxia.   Psychiatric:        Mood and Affect: Mood normal.        Behavior: Behavior normal.     (all labs ordered are listed, but only abnormal results are displayed) Labs Reviewed  COMPREHENSIVE METABOLIC PANEL WITH GFR - Abnormal; Notable for the following components:      Result Value   Sodium 129 (*)    Chloride 94 (*)    BUN 46 (*)    Creatinine, Ser 2.03 (*)    Total Protein 6.4 (*)    GFR, Estimated 34 (*)    All other components within normal limits  URINALYSIS, W/ REFLEX TO CULTURE (INFECTION SUSPECTED) - Abnormal; Notable for the following components:   Color, Urine STRAW (*)    Bacteria, UA RARE (*)    All other components within normal limits  CBG MONITORING, ED - Abnormal; Notable for the following components:   Glucose-Capillary 100 (*)    All other components within normal limits  TROPONIN I (HIGH SENSITIVITY) - Abnormal; Notable for the following components:   Troponin I (High Sensitivity) 21 (*)    All other components within normal limits  TROPONIN I (HIGH SENSITIVITY) - Abnormal;  Notable for the following components:   Troponin I (High Sensitivity) 25 (*)    All other components within normal limits  CBC WITH DIFFERENTIAL/PLATELET  LIPASE, BLOOD    EKG: EKG Interpretation Date/Time:  Thursday June 19 2024 09:51:18 EDT Ventricular Rate:  72 PR Interval:  171 QRS Duration:  103 QT Interval:  380 QTC Calculation: 416 R Axis:   -1  Text Interpretation: Sinus rhythm nonspecific changes similar to April 2025 Confirmed by Freddi,  Scott [phone]) on 06/19/2024 10:24:57 AM  Radiology: No results found.   Procedures   Medications Ordered in the ED  ondansetron  (ZOFRAN ) injection 4 mg (has no administration in time range)  sodium chloride  0.9 % bolus 1,000 mL (1,000 mLs Intravenous New Bag/Given 06/19/24 1028)                                    Medical Decision Making Amount and/or Complexity of Data Reviewed Labs: ordered.  Risk Prescription drug management.   This patient presents to the ED for concern of weakness, this involves an extensive number of treatment options, and is a complaint that carries with it a high risk of complications and morbidity.  The differential diagnosis includes Ischemic stroke, intracerebral hemorrhage, subarachnoid hemorrhage, Guillain-Barr syndrome, hypoglycemia, electrolyte abnormality, sepsis, ACS, carbon monoxide poisoning, anemia, dehydration.   Co morbidities that complicate the patient evaluation  DM, HLD, HTN, CAD s/p CABG 2023   Additional history obtained:  Additional history obtained from 07/2023 ECHO: 60-65% EF. Right heart dilation.    Problem List / ED Course / Critical interventions / Medication management  Patient presents to ED concerned for generalized weakness and fatigue x1 week. Also with poor appetite x6 weeks.  Patient's blood pressures are slightly soft with SBP in the 90s.  Rest of physical exam reassuring.  Patient afebrile with stable vitals. I Ordered, and personally interpreted labs.   Initial troponin 21 elevating to 25.  UA not concerning for infection.  CBC without leukocytosis or anemia.  Lipase within normal limits.  CBG 100.  CMP concerning for dehydration with hyponatremia at 129 and an AKI with BUN/creatinine elevated today at 46/2.03 The patient was maintained on a cardiac monitor.  I personally viewed and interpreted the EKG/cardiac monitored which showed an underlying rhythm of: Sinus rhythm Provided patient with 1L IV fluids.  Educated patient that he meets criteria for admission given his AKI.  Patient stating that he cannot be admitted today as he needs to take his wife to a PCP appointment tomorrow morning.  Patient stating that he would prefer to follow-up during PCP appointment tomorrow for repeat lab work. Patient agrees to drink copious amounts of fluids today. I had an extensive conversation with patient as to why I do not recommend being discharged at this time to include possibility of kidney failure potentially leading to other comorbidities and possibly even death. Patient still wants to leave AMA. Patient wants to leave against medical advice. Patient understands that his actions will lead to inadequate medical workup, and that he is at risk of complications, which includes morbidity and mortality. Patient given another opportunity to change mind. Patient is demonstrating good capacity to make decision. Patient understands that he needs to return to the ER immediately if his symptoms get worse. Staffed with Dr. Freddi. I have reviewed the patients home medicines and have made adjustments as needed    Social Determinants of Health:  geriatric      Final diagnoses:  AKI (acute kidney injury) [facility])    ED Discharge Orders     None           Hoy Nidia FALCON, [place] 06/19/24 1404    Freddi Hamilton, MD 06/20/24 1654

## 2024-07-05 LAB — LAB REPORT - SCANNED
A1c: 7.4
EGFR: 29.7
EGFR: 59.8

## 2024-07-07 ENCOUNTER — Encounter: Payer: Self-pay | Admitting: Family

## 2024-07-11 ENCOUNTER — Ambulatory Visit: Admitting: Primary Care

## 2024-07-18 ENCOUNTER — Ambulatory Visit: Payer: Medicare Other

## 2024-07-18 VITALS — BP 116/72 | HR 66 | Temp 97.9°F | Resp 18 | Ht 67.0 in | Wt 194.8 lb

## 2024-07-18 DIAGNOSIS — E785 Hyperlipidemia, unspecified: Secondary | ICD-10-CM

## 2024-07-18 MED ORDER — INCLISIRAN SODIUM 284 MG/1.5ML ~~LOC~~ SOSY
284.0000 mg | PREFILLED_SYRINGE | Freq: Once | SUBCUTANEOUS | Status: AC
  Administered 2024-07-18: 284 mg via SUBCUTANEOUS
  Filled 2024-07-18: qty 1.5

## 2024-07-18 NOTE — Progress Notes (Signed)
 Diagnosis: Hyperlipidemia  Provider:  Chilton Greathouse MD  Procedure: Injection  Leqvio (inclisiran), Dose: 284 mg, Site: subcutaneous, Number of injections: 1  Injection Site(s): Left arm  Post Care: Patient declined observation  Discharge: Condition: Good, Destination: Home . AVS Declined  Performed by:  Adriana Mccallum, RN

## 2024-10-13 ENCOUNTER — Inpatient Hospital Stay (HOSPITAL_COMMUNITY)
Admission: EM | Admit: 2024-10-13 | Discharge: 2024-10-18 | DRG: 481 | Disposition: A | Discharge status: Rehabilitation Facility | Attending: Orthopedic Surgery | Admitting: Orthopedic Surgery

## 2024-10-13 ENCOUNTER — Other Ambulatory Visit: Payer: Self-pay

## 2024-10-13 ENCOUNTER — Emergency Department (HOSPITAL_COMMUNITY)

## 2024-10-13 ENCOUNTER — Encounter (HOSPITAL_COMMUNITY): Payer: Self-pay

## 2024-10-13 DIAGNOSIS — S82141A Displaced bicondylar fracture of right tibia, initial encounter for closed fracture: Secondary | ICD-10-CM | POA: Diagnosis present

## 2024-10-13 DIAGNOSIS — M79605 Pain in left leg: Secondary | ICD-10-CM | POA: Diagnosis not present

## 2024-10-13 DIAGNOSIS — Z8249 Family history of ischemic heart disease and other diseases of the circulatory system: Secondary | ICD-10-CM | POA: Diagnosis not present

## 2024-10-13 DIAGNOSIS — D72829 Elevated white blood cell count, unspecified: Secondary | ICD-10-CM | POA: Diagnosis not present

## 2024-10-13 DIAGNOSIS — S82141S Displaced bicondylar fracture of right tibia, sequela: Secondary | ICD-10-CM | POA: Diagnosis not present

## 2024-10-13 DIAGNOSIS — D62 Acute posthemorrhagic anemia: Secondary | ICD-10-CM | POA: Diagnosis not present

## 2024-10-13 DIAGNOSIS — Z79899 Other long term (current) drug therapy: Secondary | ICD-10-CM | POA: Diagnosis not present

## 2024-10-13 DIAGNOSIS — Z794 Long term (current) use of insulin: Secondary | ICD-10-CM | POA: Diagnosis not present

## 2024-10-13 DIAGNOSIS — K219 Gastro-esophageal reflux disease without esophagitis: Secondary | ICD-10-CM | POA: Diagnosis present

## 2024-10-13 DIAGNOSIS — S72409A Unspecified fracture of lower end of unspecified femur, initial encounter for closed fracture: Principal | ICD-10-CM

## 2024-10-13 DIAGNOSIS — K5900 Constipation, unspecified: Secondary | ICD-10-CM | POA: Diagnosis present

## 2024-10-13 DIAGNOSIS — E119 Type 2 diabetes mellitus without complications: Secondary | ICD-10-CM | POA: Diagnosis not present

## 2024-10-13 DIAGNOSIS — E785 Hyperlipidemia, unspecified: Secondary | ICD-10-CM | POA: Diagnosis present

## 2024-10-13 DIAGNOSIS — I10 Essential (primary) hypertension: Secondary | ICD-10-CM | POA: Diagnosis present

## 2024-10-13 DIAGNOSIS — S728X1A Other fracture of right femur, initial encounter for closed fracture: Secondary | ICD-10-CM | POA: Diagnosis not present

## 2024-10-13 DIAGNOSIS — E7849 Other hyperlipidemia: Secondary | ICD-10-CM | POA: Diagnosis present

## 2024-10-13 DIAGNOSIS — I2510 Atherosclerotic heart disease of native coronary artery without angina pectoris: Secondary | ICD-10-CM | POA: Diagnosis present

## 2024-10-13 DIAGNOSIS — S72451A Displaced supracondylar fracture without intracondylar extension of lower end of right femur, initial encounter for closed fracture: Secondary | ICD-10-CM | POA: Diagnosis present

## 2024-10-13 DIAGNOSIS — R21 Rash and other nonspecific skin eruption: Secondary | ICD-10-CM | POA: Diagnosis not present

## 2024-10-13 DIAGNOSIS — S72401A Unspecified fracture of lower end of right femur, initial encounter for closed fracture: Secondary | ICD-10-CM | POA: Diagnosis not present

## 2024-10-13 DIAGNOSIS — E1165 Type 2 diabetes mellitus with hyperglycemia: Secondary | ICD-10-CM | POA: Diagnosis present

## 2024-10-13 DIAGNOSIS — W11XXXA Fall on and from ladder, initial encounter: Secondary | ICD-10-CM | POA: Diagnosis present

## 2024-10-13 DIAGNOSIS — Z9852 Vasectomy status: Secondary | ICD-10-CM | POA: Diagnosis not present

## 2024-10-13 DIAGNOSIS — E871 Hypo-osmolality and hyponatremia: Secondary | ICD-10-CM | POA: Diagnosis present

## 2024-10-13 DIAGNOSIS — S7291XS Unspecified fracture of right femur, sequela: Secondary | ICD-10-CM | POA: Diagnosis not present

## 2024-10-13 DIAGNOSIS — S72461A Displaced supracondylar fracture with intracondylar extension of lower end of right femur, initial encounter for closed fracture: Secondary | ICD-10-CM | POA: Diagnosis present

## 2024-10-13 DIAGNOSIS — Z951 Presence of aortocoronary bypass graft: Secondary | ICD-10-CM

## 2024-10-13 DIAGNOSIS — E1169 Type 2 diabetes mellitus with other specified complication: Secondary | ICD-10-CM | POA: Diagnosis present

## 2024-10-13 DIAGNOSIS — I252 Old myocardial infarction: Secondary | ICD-10-CM | POA: Diagnosis not present

## 2024-10-13 DIAGNOSIS — R739 Hyperglycemia, unspecified: Secondary | ICD-10-CM | POA: Diagnosis not present

## 2024-10-13 DIAGNOSIS — R339 Retention of urine, unspecified: Secondary | ICD-10-CM | POA: Diagnosis present

## 2024-10-13 DIAGNOSIS — Z87891 Personal history of nicotine dependence: Secondary | ICD-10-CM | POA: Diagnosis not present

## 2024-10-13 DIAGNOSIS — S92012A Displaced fracture of body of left calcaneus, initial encounter for closed fracture: Secondary | ICD-10-CM | POA: Diagnosis present

## 2024-10-13 DIAGNOSIS — W11XXXS Fall on and from ladder, sequela: Secondary | ICD-10-CM | POA: Diagnosis present

## 2024-10-13 DIAGNOSIS — Z4789 Encounter for other orthopedic aftercare: Secondary | ICD-10-CM | POA: Diagnosis not present

## 2024-10-13 DIAGNOSIS — Z7901 Long term (current) use of anticoagulants: Secondary | ICD-10-CM | POA: Diagnosis not present

## 2024-10-13 DIAGNOSIS — S92002S Unspecified fracture of left calcaneus, sequela: Secondary | ICD-10-CM | POA: Diagnosis not present

## 2024-10-13 DIAGNOSIS — E559 Vitamin D deficiency, unspecified: Secondary | ICD-10-CM | POA: Diagnosis present

## 2024-10-13 HISTORY — DX: Sleep apnea, unspecified: G47.30

## 2024-10-13 LAB — I-STAT CHEM 8, ED
BUN: 14 mg/dL (ref 8–23)
Calcium, Ion: 1.17 mmol/L (ref 1.15–1.40)
Chloride: 103 mmol/L (ref 98–111)
Creatinine, Ser: 0.9 mg/dL (ref 0.61–1.24)
Glucose, Bld: 107 mg/dL — ABNORMAL HIGH (ref 70–99)
HCT: 39 % (ref 39.0–52.0)
Hemoglobin: 13.3 g/dL (ref 13.0–17.0)
Potassium: 4.3 mmol/L (ref 3.5–5.1)
Sodium: 139 mmol/L (ref 135–145)
TCO2: 24 mmol/L (ref 22–32)

## 2024-10-13 LAB — TYPE AND SCREEN
ABO/RH(D): O NEG
Antibody Screen: NEGATIVE

## 2024-10-13 LAB — HEMOGLOBIN A1C
Hgb A1c MFr Bld: 6.4 % — ABNORMAL HIGH (ref 4.8–5.6)
Mean Plasma Glucose: 136.98 mg/dL

## 2024-10-13 LAB — CBC
HCT: 40 % (ref 39.0–52.0)
Hemoglobin: 12.6 g/dL — ABNORMAL LOW (ref 13.0–17.0)
MCH: 29.4 pg (ref 26.0–34.0)
MCHC: 31.5 g/dL (ref 30.0–36.0)
MCV: 93.5 fL (ref 80.0–100.0)
Platelets: 217 K/uL (ref 150–400)
RBC: 4.28 MIL/uL (ref 4.22–5.81)
RDW: 14.9 % (ref 11.5–15.5)
WBC: 15.6 K/uL — ABNORMAL HIGH (ref 4.0–10.5)
nRBC: 0 % (ref 0.0–0.2)

## 2024-10-13 LAB — ETHANOL: Alcohol, Ethyl (B): <15 mg/dL (ref <15)

## 2024-10-13 LAB — COMPREHENSIVE METABOLIC PANEL WITH GFR
ALT: 26 U/L (ref 0–44)
AST: 33 U/L (ref 15–41)
Albumin: 3.7 g/dL (ref 3.5–5.0)
Alkaline Phosphatase: 63 U/L (ref 38–126)
Anion gap: 9 (ref 5–15)
BUN: 12 mg/dL (ref 8–23)
CO2: 24 mmol/L (ref 22–32)
Calcium: 9.2 mg/dL (ref 8.9–10.3)
Chloride: 105 mmol/L (ref 98–111)
Creatinine, Ser: 0.95 mg/dL (ref 0.61–1.24)
GFR, Estimated: >60 mL/min (ref >60)
Glucose, Bld: 111 mg/dL — ABNORMAL HIGH (ref 70–99)
Potassium: 4.5 mmol/L (ref 3.5–5.1)
Sodium: 138 mmol/L (ref 135–145)
Total Bilirubin: 0.9 mg/dL (ref 0.0–1.2)
Total Protein: 6.5 g/dL (ref 6.5–8.1)

## 2024-10-13 LAB — SAMPLE TO BLOOD BANK

## 2024-10-13 LAB — PROTIME-INR
INR: 1.1 (ref 0.8–1.2)
Prothrombin Time: 15 s (ref 11.4–15.2)

## 2024-10-13 LAB — I-STAT CG4 LACTIC ACID, ED: Lactic Acid, Venous: 1.2 mmol/L (ref 0.5–1.9)

## 2024-10-13 MED ORDER — TAMSULOSIN HCL 0.4 MG PO CAPS
0.4000 mg | ORAL_CAPSULE | Freq: Every day | ORAL | Status: DC
  Administered 2024-10-15 – 2024-10-18 (×4): 0.4 mg via ORAL
  Filled 2024-10-13 (×4): qty 1

## 2024-10-13 MED ORDER — ONDANSETRON HCL 4 MG/2ML IJ SOLN
4.0000 mg | Freq: Once | INTRAMUSCULAR | Status: AC
  Administered 2024-10-13: 4 mg via INTRAVENOUS

## 2024-10-13 MED ORDER — ONDANSETRON HCL 4 MG PO TABS: 4.0000 mg | ORAL_TABLET | Freq: Four times a day (QID) | ORAL | Status: DC | PRN

## 2024-10-13 MED ORDER — SENNOSIDES-DOCUSATE SODIUM 8.6-50 MG PO TABS
1.0000 | ORAL_TABLET | Freq: Two times a day (BID) | ORAL | Status: DC
  Administered 2024-10-14 – 2024-10-17 (×7): 1 via ORAL
  Filled 2024-10-13 (×9): qty 1

## 2024-10-13 MED ORDER — SPIRONOLACTONE 25 MG PO TABS
25.0000 mg | ORAL_TABLET | Freq: Every day | ORAL | Status: DC
  Administered 2024-10-15 – 2024-10-18 (×4): 25 mg via ORAL
  Filled 2024-10-13 (×4): qty 1

## 2024-10-13 MED ORDER — HYDROMORPHONE HCL 1 MG/ML IJ SOLN
1.0000 mg | Freq: Once | INTRAMUSCULAR | Status: AC
  Administered 2024-10-13: 1 mg via INTRAVENOUS

## 2024-10-13 MED ORDER — ACETAMINOPHEN 500 MG PO TABS
1000.0000 mg | ORAL_TABLET | Freq: Three times a day (TID) | ORAL | Status: DC
  Administered 2024-10-14 – 2024-10-15 (×4): 1000 mg via ORAL
  Filled 2024-10-13 (×4): qty 2

## 2024-10-13 MED ORDER — TRANEXAMIC ACID-NACL 1000-0.7 MG/100ML-% IV SOLN
1000.0000 mg | INTRAVENOUS | Status: AC
  Administered 2024-10-14: 1000 mg via INTRAVENOUS
  Filled 2024-10-13: qty 100

## 2024-10-13 MED ORDER — LOSARTAN POTASSIUM 50 MG PO TABS
100.0000 mg | ORAL_TABLET | Freq: Every day | ORAL | Status: DC
  Administered 2024-10-16 – 2024-10-18 (×3): 100 mg via ORAL
  Filled 2024-10-13 (×4): qty 2

## 2024-10-13 MED ORDER — METOPROLOL SUCCINATE ER 50 MG PO TB24
50.0000 mg | ORAL_TABLET | Freq: Every day | ORAL | Status: DC
  Administered 2024-10-15 – 2024-10-18 (×4): 50 mg via ORAL
  Filled 2024-10-13 (×4): qty 1

## 2024-10-13 MED ORDER — ONDANSETRON HCL 4 MG/2ML IJ SOLN
INTRAMUSCULAR | Status: AC
  Filled 2024-10-13: qty 2

## 2024-10-13 MED ORDER — POLYETHYLENE GLYCOL 3350 17 G PO PACK
17.0000 g | PACK | Freq: Every day | ORAL | Status: DC
  Administered 2024-10-14 – 2024-10-16 (×3): 17 g via ORAL
  Filled 2024-10-13 (×5): qty 1

## 2024-10-13 MED ORDER — ONDANSETRON HCL 4 MG/2ML IJ SOLN: 4.0000 mg | Freq: Four times a day (QID) | INTRAMUSCULAR | Status: DC | PRN

## 2024-10-13 MED ORDER — CEFAZOLIN SODIUM-DEXTROSE 2-4 GM/100ML-% IV SOLN
2.0000 g | INTRAVENOUS | Status: AC
  Administered 2024-10-14 (×2): 2 g via INTRAVENOUS
  Filled 2024-10-13: qty 100

## 2024-10-13 MED ORDER — HYDROMORPHONE HCL 1 MG/ML IJ SOLN
INTRAMUSCULAR | Status: AC
  Filled 2024-10-13: qty 1

## 2024-10-13 MED ORDER — INSULIN ASPART 100 UNIT/ML IJ SOLN
0.0000 [IU] | Freq: Every day | INTRAMUSCULAR | Status: DC
  Administered 2024-10-15 – 2024-10-16 (×2): 2 [IU] via SUBCUTANEOUS

## 2024-10-13 MED ORDER — OXYCODONE HCL 5 MG PO TABS
5.0000 mg | ORAL_TABLET | ORAL | Status: DC | PRN
  Administered 2024-10-14 (×2): 10 mg via ORAL
  Filled 2024-10-13: qty 1
  Filled 2024-10-13: qty 2
  Filled 2024-10-13: qty 1

## 2024-10-13 MED ORDER — METHOCARBAMOL 500 MG PO TABS
500.0000 mg | ORAL_TABLET | Freq: Four times a day (QID) | ORAL | Status: DC
  Administered 2024-10-14 – 2024-10-18 (×16): 500 mg via ORAL
  Filled 2024-10-13 (×16): qty 1

## 2024-10-13 MED ORDER — SIMVASTATIN 20 MG PO TABS
40.0000 mg | ORAL_TABLET | Freq: Every day | ORAL | Status: DC
  Administered 2024-10-15 – 2024-10-18 (×4): 40 mg via ORAL
  Filled 2024-10-13 (×4): qty 2

## 2024-10-13 MED ORDER — PANTOPRAZOLE SODIUM 40 MG PO TBEC
40.0000 mg | DELAYED_RELEASE_TABLET | Freq: Every day | ORAL | Status: DC
  Administered 2024-10-15 – 2024-10-18 (×4): 40 mg via ORAL
  Filled 2024-10-13 (×4): qty 1

## 2024-10-13 MED ORDER — INSULIN ASPART 100 UNIT/ML IJ SOLN
0.0000 [IU] | Freq: Three times a day (TID) | INTRAMUSCULAR | Status: DC
  Administered 2024-10-15: 5 [IU] via SUBCUTANEOUS
  Administered 2024-10-15: 8 [IU] via SUBCUTANEOUS
  Administered 2024-10-15 – 2024-10-17 (×5): 3 [IU] via SUBCUTANEOUS
  Administered 2024-10-17 (×2): 5 [IU] via SUBCUTANEOUS
  Administered 2024-10-18: 3 [IU] via SUBCUTANEOUS
  Administered 2024-10-18: 5 [IU] via SUBCUTANEOUS

## 2024-10-13 NOTE — ED Provider Notes (Signed)
 [place] EMERGENCY DEPARTMENT AT [facility] Provider Note   CSN: [id number] Arrival date & time: 10/13/24  8079     Patient presents with: Antonio Patterson is a 73 y.o. male.   73 yo M with chief complaints of right leg pain.  Patient fell about 8 feet off of the ladder and landed on both of his feet.  He said he had obvious pain and deformity to his right upper leg and was also having pain to his left heel.  He denies injury elsewhere.  Denies head injury denies neck pain denies chest pain abdominal pain denies back pain.  Denies upper extremity pain.  Per EMS obvious deformity was placed in traction in the field.        Prior to Admission medications   Medication Sig Start Date End Date Taking? Authorizing Provider  acetaminophen  (TYLENOL ) 650 MG CR tablet Take 1,300 mg by mouth every 8 (eight) hours as needed for pain.    [provider]  albuterol (VENTOLIN HFA) 108 (90 Base) MCG/ACT inhaler SMARTSIG:1 Puff(s) By Mouth Every 6 Hours PRN Patient not taking: Reported on 04/11/2024 01/25/24   [provider]  aspirin  EC 81 MG tablet Take 1 tablet (81 mg total) by mouth daily. 12/01/22   Antonio Ned, MD  glucose blood test strip 1 each by Other route in the morning, at noon, in the evening, and at bedtime.     [provider]  INCLISIRAN SODIUM  [place] Inject into the skin.    [provider]  insulin  aspart (NOVOLOG  FLEXPEN RELION) 100 UNIT/ML FlexPen Inject 5-40 Units into the skin See admin instructions. 3-5 times per day, and pen needles 1/day 01/26/21   [name], MD  losartan  (COZAAR ) 100 MG tablet Take 1 tablet by mouth once daily 06/04/24   Patterson, Antonio Ned, MD  metoprolol  succinate (TOPROL -XL) 50 MG 24 hr tablet Take 50 mg by mouth daily. Take with or immediately following a meal.    [provider]  omeprazole  (PRILOSEC) 40 MG capsule Take 40 mg by mouth daily.    [provider]   oxymetazoline (AFRIN) 0.05 % nasal spray Place 1 spray into both nostrils 2 (two) times daily.    [provider]  simvastatin  (ZOCOR ) 40 MG tablet Take 40 mg by mouth daily.    [provider]  spironolactone  (ALDACTONE ) 25 MG tablet Take 1 tablet by mouth once daily 06/02/24   Patterson, Antonio Ned, MD  tamsulosin  (FLOMAX ) 0.4 MG CAPS capsule Take 1 capsule (0.4 mg total) by mouth daily. 02/28/19   Opalski, Barnie, DO  traMADol  (ULTRAM ) 50 MG tablet Take 1 tablet (50 mg total) by mouth every 6 (six) hours as needed for moderate pain. 08/21/22   [name], PA-C  Vitamin D , Ergocalciferol , (DRISDOL ) 1.25 MG (50000 UT) CAPS capsule Take one tablet wkly Patient not taking: Reported on 04/11/2024 03/07/19   [name], DO    Allergies: Lipitor [atorvastatin ], Metformin , Rosuvastatin, Sulfa antibiotics, and Sulfonamide derivatives    Review of Systems  Updated Vital Signs BP (!) 163/77   Pulse 63   Resp (!) 22   Ht 5' 7 (1.702 m)   Wt 86.2 kg   SpO2 95%   BMI 29.76 kg/m   Physical Exam Vitals and nursing note reviewed.  Constitutional:      Appearance: He is well-developed.  HENT:     Head: Normocephalic and atraumatic.  Eyes:  Pupils: Pupils are equal, round, and reactive to light.  Neck:     Vascular: No JVD.  Cardiovascular:     Rate and Rhythm: Normal rate and regular rhythm.     Heart sounds: No murmur heard.    No friction rub. No gallop.  Pulmonary:     Effort: No respiratory distress.     Breath sounds: No wheezing.  Abdominal:     General: There is no distension.     Tenderness: There is no abdominal tenderness. There is no guarding or rebound.  Musculoskeletal:        General: Swelling and tenderness present. Normal range of motion.     Cervical back: Normal range of motion and neck supple.     Comments: Deformity about the right lower femur and about the knee.  Some difficulty with plantarflexion of the left lower extremity.  No  obvious palpable defect to the Achilles.  Squeezing of the calf muscle does not cause any plantarflexion.  Easily palpable left lower extremity pulse.  Motor and sensation intact of the left foot.  Right leg with diminished pulses.  Dopplerable.   Skin:    Coloration: Skin is not pale.     Findings: No rash.  Neurological:     Mental Status: He is alert and oriented to person, place, and time.  Psychiatric:        Behavior: Behavior normal.     (all labs ordered are listed, but only abnormal results are displayed) Labs Reviewed  COMPREHENSIVE METABOLIC PANEL WITH GFR - Abnormal; Notable for the following components:      Result Value   Glucose, Bld 111 (*)    All other components within normal limits  CBC - Abnormal; Notable for the following components:   WBC 15.6 (*)    Hemoglobin 12.6 (*)    All other components within normal limits  I-STAT CHEM 8, ED - Abnormal; Notable for the following components:   Glucose, Bld 107 (*)    All other components within normal limits  ETHANOL  PROTIME-INR  URINALYSIS, ROUTINE W REFLEX MICROSCOPIC  I-STAT CG4 LACTIC ACID, ED  SAMPLE TO BLOOD BANK  TYPE AND SCREEN    EKG: EKG Interpretation Date/Time:  Monday October 13 2024 19:29:43 EDT Ventricular Rate:  59 PR Interval:  155 QRS Duration:  99 QT Interval:  440 QTC Calculation: 436 R Axis:   20  Text Interpretation: Sinus rhythm Low voltage, precordial leads Borderline T abnormalities, inferior leads No significant change since last tracing Confirmed by Emil Share [phone]) on 10/13/2024 8:23:56 PM  Radiology: [facility] Tibia/Fibula Right Port Result Date: 10/13/2024 CLINICAL DATA:  Fall from ladder with right leg deformity, initial encounter EXAM: PORTABLE RIGHT TIBIA AND FIBULA - 2 VIEW COMPARISON:  None Available. FINDINGS: Known femoral fracture is incompletely evaluated on this exam. No tibial or fibular fracture is seen. No soft tissue changes are noted. IMPRESSION: No acute  abnormality of the tibia and fibula. Electronically Signed   By: Antonio Patterson M.D.   On: 10/13/2024 20:23   DG Ankle Right Port Result Date: 10/13/2024 CLINICAL DATA:  Fall from ladder with right leg deformity, initial encounter EXAM: PORTABLE RIGHT ANKLE - 2 VIEW COMPARISON:  None Available. FINDINGS: There is no evidence of fracture, dislocation, or joint effusion. There is no evidence of arthropathy or other focal bone abnormality. Soft tissues are unremarkable. IMPRESSION: No acute abnormality Electronically Signed   By: Antonio Patterson M.D.   On: 10/13/2024 20:22  DG FEMUR PORT, 1V RIGHT Result Date: 10/13/2024 CLINICAL DATA:  Fall from ladder with right leg deformity, initial encounter EXAM: RIGHT FEMUR PORTABLE 2 VIEW COMPARISON:  None Available. FINDINGS: Distal right femoral fracture is noted with continuation of the fracture line inferiorly towards the intercondylar notch similar to that seen on prior knee film. Mild angulation of the distal fracture fragment is seen laterally. IMPRESSION: Distal femoral fracture as described. Electronically Signed   By: Antonio Patterson M.D.   On: 10/13/2024 20:22   DG Foot 2 Views Left Result Date: 10/13/2024 CLINICAL DATA:  Fall from ladder with left foot pain, initial encounter EXAM: LEFT FOOT - 2 VIEW COMPARISON:  None Available. FINDINGS: Comminuted fracture of the calcaneus is noted with impaction at the fracture site. Mild soft tissue swelling is noted. No other bony abnormality is seen. IMPRESSION: Comminuted calcaneal fracture. Electronically Signed   By: Antonio Patterson M.D.   On: 10/13/2024 20:21   DG Foot Complete Right Result Date: 10/13/2024 CLINICAL DATA:  Fall from ladder with right foot pain, initial encounter EXAM: RIGHT FOOT COMPLETE - 3+ VIEW COMPARISON:  None Available. FINDINGS: There is no evidence of fracture or dislocation. There is no evidence of arthropathy or other focal bone abnormality. Soft tissues are unremarkable. Calcaneal spur is  noted. IMPRESSION: No acute abnormality noted. Electronically Signed   By: Antonio Patterson M.D.   On: 10/13/2024 20:20   DG Knee Right Port Result Date: 10/13/2024 CLINICAL DATA:  Fall from ladder with obvious right thigh deformity EXAM: PORTABLE RIGHT KNEE - 2 VIEW COMPARISON:  None Available. FINDINGS: Oblique fracture is noted through the distal femur at the diaphyseal metaphyseal junction. The fracture line extends through the metaphysis into the intercondylar notch with only mild displacement identified. Degenerative changes of the knee joint are seen. No joint effusion is noted. IMPRESSION: Distal right femoral fracture as described. Electronically Signed   By: Antonio Patterson M.D.   On: 10/13/2024 20:20   DG Pelvis Portable Result Date: 10/13/2024 CLINICAL DATA:  Fall from ladder with right leg pain, initial encounter EXAM: PORTABLE PELVIS 1 VIEWS COMPARISON:  None Available. FINDINGS: Pelvic ring is intact. No acute fracture or dislocation is noted. No soft tissue changes are seen. Degenerative change of the lumbar spine is noted. IMPRESSION: No acute abnormality seen. Electronically Signed   By: Antonio Patterson M.D.   On: 10/13/2024 20:19     Procedures   Medications Ordered in the ED  ceFAZolin  (ANCEF ) IVPB 2g/100 mL premix (has no administration in time range)  tranexamic acid  (CYKLOKAPRON ) IVPB 1,000 mg (has no administration in time range)                                    Medical Decision Making Amount and/or Complexity of Data Reviewed Labs: ordered. Radiology: ordered.   73 yo M with a chief complaints of fall.  Patient fell off of a ladder and landed on his feet.  Complaining of pain and deformity to his right leg.  Plain film on my independent interpretation consistent with a distal femur fracture.  Extension into the knee joint.  Plain film of the left foot on my independent interpretation with a calcaneal fracture.  No anemia no significant electrolyte abnormalities.   Lactate normal.  I discussed this with Dr. Georgina, Ortho recommends posterior splint on the left foot and CT foot and right femur.  Ortho to admit.  The patients results and plan were reviewed and discussed.   Any x-rays performed were independently reviewed by myself.   Differential diagnosis were considered with the presenting HPI.  Medications  ceFAZolin  (ANCEF ) IVPB 2g/100 mL premix (has no administration in time range)  tranexamic acid  (CYKLOKAPRON ) IVPB 1,000 mg (has no administration in time range)    Vitals:   10/13/24 1924 10/13/24 1930 10/13/24 2004 10/13/24 2012  BP: 130/70 (!) 153/77  (!) 163/77  Pulse:  (!) 58  63  Resp:  14  (!) 22  SpO2:  95%  95%  Weight:   86.2 kg   Height:   5' 7 (1.702 m)     Final diagnoses:  Closed fracture of distal end of femur, unspecified fracture morphology, initial encounter (HCC)  Closed displaced fracture of body of left calcaneus, initial encounter    Admission/ observation were discussed with the admitting physician, patient and/or family and they are comfortable with the plan.         Final diagnoses:  Closed fracture of distal end of femur, unspecified fracture morphology, initial encounter (HCC)  Closed displaced fracture of body of left calcaneus, initial encounter    ED Discharge Orders     None          Emil Share, DO 10/13/24 2048

## 2024-10-13 NOTE — Progress Notes (Signed)
   10/13/24 1915  Spiritual Encounters  Type of Visit Initial  Care provided to: Pt not available  Referral source Trauma page  Reason for visit Trauma  OnCall Visit No    Chaplain responded to a level two trauma. No family is present.  If a chaplain is requested someone will respond.   Carley [place] [facility]  [phone]

## 2024-10-13 NOTE — ED Notes (Signed)
 Ortho MD at bedside.

## 2024-10-13 NOTE — H&P (Addendum)
 Orthopedic Surgery H&P Note  Assessment: Patient is a 73 y.o. male with right distal femur fracture with intra-articular extension, left calcaneus fracture   Plan: -Will obtain CTs of the distal femur on the right and the left calcaneus -Suspect the left calcaneus could be treated nonoperatively with a period of nonweightbearing, but the distal femur fracture will require operative fixation.  Planning for tomorrow evening -Diet: Diabetic, n.p.o. at midnight -DVT ppx: aspirin  81mg  BID postoperatively -Ancef  and TXA on call to OR -Weight bearing status: NWB RLE -PT evaluate and treat postop -Pain control -Dispo: pending completion of operative plans   Discussed recommendation for operative intervention in the form of right distal femur and right supracondylar femur fracture open reduction internal fixation. Explained the risks of this procedure included, but were not limited to: nonunion, malunion, hardware failure/malposition, infection, bleeding, stiffness, posttraumatic arthritis, need for additional procedures, deep vein thrombosis, pulmonary embolism, and death. The benefits of this procedure would be to promote fracture healing by providing stability and to heal the fracture in the appropriate alignment. The alternatives of this surgery would be to treat the fracture with immobilization in a splint/brace/cast or to do no intervention. The patient's questions were answered to his satisfaction. After this discussion, patient elected to proceed with surgery. Informed consent was obtained.   ___________________________________________________________________________   Chief complaint: right thigh pain, left foot pain  History:  Patient is a 73 y.o. male who was working on a ladder earlier today when he had a fall.  He noted immediate onset of right thigh and knee pain and left heel pain.  He was brought to [facility] emergency department as he was unable to weight-bear.  He was found to  have a right femur fracture and a left calcaneus fracture.  These were isolated injuries.  Orthopedics was consulted.  He is reporting right thigh pain and knee pain.  He is also having left foot and heel pain.  No pain elsewhere.  Review of systems: General: denies fevers and chills, myalgias Neurologic: denies recent changes in vision, slurred speech Abdomen: denies nausea, vomiting, hematemesis Respiratory: denies cough, shortness of breath  Past medical history:  Allergic rhinitis CAD DM Antonio HLD HTN  Allergies: atorvastatin , metformin , rosuvastatin, sulfa  Past surgical history:  CABG Turbinate reduction Vasectomy  Social history: Denies use of nicotine-containing products (cigarettes, vaping, smokeless, etc.) Alcohol use: Yes, approximately 1 drink per week Denies use of recreational drugs  Family history: -reviewed and not pertinent to femur fracture and calcaneus fracture   Physical Exam:  BMI of 29.8  General: no acute distress, appears stated age Neurologic: alert, answering questions appropriately, following commands Cardiovascular: regular rate, no cyanosis Respiratory: unlabored breathing on room air, symmetric chest rise Psychiatric: appropriate affect, normal cadence to speech  MSK:   -Bilateral upper extremities  No tenderness to palpation over extremity, no gross deformity, no open wounds Fires deltoid, biceps, triceps, wrist extensors, wrist flexors, finger extensors, finger flexors  AIN/PIN/IO intact  Palpable radial pulse  Sensation intact to light touch in median/ulnar/radial/axillary nerve distributions  Hand warm and well perfused  -Right lower extremity  TTP around the thigh and knee, no other tenderness palpation of the remainder of the extremity, no open wounds, gross deformity at the distal thigh.  No groin pain with logroll but does have pain at the thigh Does not fire hip flexors, quadriceps, or hamstrings due to pain.  Fires tibialis  anterior, gastrocnemius and soleus, extensor hallucis longus Plantarflexes and dorsiflexes toes Sensation  intact to light touch in sural, saphenous, tibial, deep peroneal, and superficial peroneal nerve distributions Foot warm and well perfused, dopplerable DP and PT pulses  -Left lower extremity  TTP around the foot especially the hindfoot, no swelling seen around the calcaneus, no open wounds seen, no other tenderness palpation over the remainder of the extremity, no groin pain with logroll, no gross deformity seen Fires hip flexors, quadriceps, hamstrings, tibialis anterior, gastrocnemius and soleus, extensor hallucis longus Plantarflexes and dorsiflexes toes Sensation intact to light touch in sural, saphenous, tibial, deep peroneal, and superficial peroneal nerve distributions Foot warm and well perfused, dopplerable DP and PT pulses  Imaging: XRs of the right femur from 10/202/2025 was independently reviewed and interpreted, showing a distal one third spiral femur fracture with intra-articular extension through the intercondylar fossa.  No other fracture seen.  Fracture and valgus alignment.  XRs of the left foot from 10/13/2024 was independently reviewed and interpreted, showing a comminuted calcaneus fracture.   Patient name: Antonio Patterson Patient MRN: [id number] Date: 10/13/24

## 2024-10-13 NOTE — Progress Notes (Signed)
 Transition of Care [facility]) - CAGE-AID Screening   Patient Details  Name: Antonio Patterson MRN: [id number] Date of Birth: August 17, 1951      MARINDA LIONEL Sora, RN Phone Number: 10/13/2024, 11:28 PM   Clinical Narrative: Pt reports very rare etoh usage Denies current tobacco usage Denies elicit substance usage Denies need for resources.    CAGE-AID Screening:    Have You Ever Felt You Ought to Cut Down on Your Drinking or Drug Use?: No Have People Annoyed You By Critizing Your Drinking Or Drug Use?: No Have You Felt Bad Or Guilty About Your Drinking Or Drug Use?: No Have You Ever Had a Drink or Used Drugs First Thing In The Morning to Steady Your Nerves or to Get Rid of a Hangover?: No CAGE-AID Score: 0

## 2024-10-13 NOTE — ED Notes (Signed)
 Patient transported to CT

## 2024-10-13 NOTE — ED Triage Notes (Signed)
 Pt bibrems from home. Patient fell 8 ft from ladder on feet. Pt has obvious deformity to right thigh with inward rotation on same leg. Ems applied traction in the field. Denies loc or hitting head. 100 mcg fentanyl  given with ems. 152/78 Hr 60s  98% RA

## 2024-10-14 ENCOUNTER — Inpatient Hospital Stay (HOSPITAL_COMMUNITY): Admitting: Certified Registered Nurse Anesthetist

## 2024-10-14 ENCOUNTER — Encounter (HOSPITAL_COMMUNITY): Payer: Self-pay | Admitting: Orthopedic Surgery

## 2024-10-14 ENCOUNTER — Inpatient Hospital Stay (HOSPITAL_COMMUNITY)

## 2024-10-14 ENCOUNTER — Encounter (HOSPITAL_COMMUNITY)
Admission: EM | Disposition: A | Discharge status: Skilled Nursing Facility | Payer: Self-pay | Source: Home / Self Care | Attending: Orthopedic Surgery

## 2024-10-14 DIAGNOSIS — Z87891 Personal history of nicotine dependence: Secondary | ICD-10-CM

## 2024-10-14 DIAGNOSIS — E119 Type 2 diabetes mellitus without complications: Secondary | ICD-10-CM

## 2024-10-14 DIAGNOSIS — S72461A Displaced supracondylar fracture with intracondylar extension of lower end of right femur, initial encounter for closed fracture: Secondary | ICD-10-CM

## 2024-10-14 DIAGNOSIS — I10 Essential (primary) hypertension: Secondary | ICD-10-CM

## 2024-10-14 DIAGNOSIS — S72401A Unspecified fracture of lower end of right femur, initial encounter for closed fracture: Secondary | ICD-10-CM

## 2024-10-14 HISTORY — PX: ORIF FEMUR FRACTURE: SHX2119

## 2024-10-14 LAB — URINALYSIS, ROUTINE W REFLEX MICROSCOPIC
Bilirubin Urine: NEGATIVE
Glucose, UA: NEGATIVE mg/dL
Hgb urine dipstick: NEGATIVE
Ketones, ur: 20 mg/dL — AB
Nitrite: POSITIVE — AB
Protein, ur: NEGATIVE mg/dL
Specific Gravity, Urine: 1.015 (ref 1.005–1.030)
WBC, UA: >50 WBC/hpf (ref 0–5)
pH: 6 (ref 5.0–8.0)

## 2024-10-14 LAB — GLUCOSE, CAPILLARY
Glucose-Capillary: 95 mg/dL (ref 70–99)
Glucose-Capillary: 132 mg/dL — ABNORMAL HIGH (ref 70–99)
Glucose-Capillary: 163 mg/dL — ABNORMAL HIGH (ref 70–99)
Glucose-Capillary: 151 mg/dL — ABNORMAL HIGH (ref 70–99)
Glucose-Capillary: 197 mg/dL — ABNORMAL HIGH (ref 70–99)

## 2024-10-14 LAB — CBG MONITORING, ED
Glucose-Capillary: 115 mg/dL — ABNORMAL HIGH (ref 70–99)
Glucose-Capillary: 157 mg/dL — ABNORMAL HIGH (ref 70–99)

## 2024-10-14 LAB — SURGICAL PCR SCREEN
MRSA, PCR: NEGATIVE
Staphylococcus aureus: POSITIVE — AB

## 2024-10-14 SURGERY — OPEN REDUCTION INTERNAL FIXATION (ORIF) DISTAL FEMUR FRACTURE
Anesthesia: Spinal | Laterality: Right

## 2024-10-14 MED ORDER — ONDANSETRON HCL 4 MG/2ML IJ SOLN
INTRAMUSCULAR | Status: DC | PRN
  Administered 2024-10-14: 4 mg via INTRAVENOUS

## 2024-10-14 MED ORDER — ORAL CARE MOUTH RINSE: 15.0000 mL | Freq: Once | OROMUCOSAL | Status: AC

## 2024-10-14 MED ORDER — STERILE WATER FOR IRRIGATION IR SOLN
Status: DC | PRN
  Administered 2024-10-14: 1000 mL

## 2024-10-14 MED ORDER — LIDOCAINE 2% (20 MG/ML) 5 ML SYRINGE
INTRAMUSCULAR | Status: AC
  Filled 2024-10-14: qty 5

## 2024-10-14 MED ORDER — VASOPRESSIN 20 UNIT/ML IV SOLN
INTRAVENOUS | Status: DC | PRN
  Administered 2024-10-14 (×3): 1 [IU] via INTRAVENOUS

## 2024-10-14 MED ORDER — INSULIN ASPART 100 UNIT/ML IJ SOLN: 0.0000 [IU] | INTRAMUSCULAR | Status: DC | PRN

## 2024-10-14 MED ORDER — CEFAZOLIN SODIUM 1 G IJ SOLR
INTRAMUSCULAR | Status: AC
  Filled 2024-10-14: qty 20

## 2024-10-14 MED ORDER — OXYCODONE HCL 5 MG/5ML PO SOLN: 5.0000 mg | Freq: Once | ORAL | Status: DC | PRN

## 2024-10-14 MED ORDER — MUPIROCIN 2 % EX OINT
1.0000 | TOPICAL_OINTMENT | Freq: Two times a day (BID) | CUTANEOUS | Status: DC
  Administered 2024-10-14 – 2024-10-18 (×8): 1 via NASAL
  Filled 2024-10-14: qty 22

## 2024-10-14 MED ORDER — BUPIVACAINE HCL (PF) 0.5 % IJ SOLN
INTRAMUSCULAR | Status: DC | PRN
  Administered 2024-10-14: 3 mL

## 2024-10-14 MED ORDER — VASOPRESSIN 20 UNIT/ML IV SOLN
INTRAVENOUS | Status: AC
  Filled 2024-10-14: qty 1

## 2024-10-14 MED ORDER — EPHEDRINE 5 MG/ML INJ
INTRAVENOUS | Status: AC
Start: 2024-10-14 — End: 2024-10-14
  Filled 2024-10-14: qty 5

## 2024-10-14 MED ORDER — MIDAZOLAM HCL (PF) 2 MG/2ML IJ SOLN
INTRAMUSCULAR | Status: DC | PRN
  Administered 2024-10-14 (×2): 1 mg via INTRAVENOUS

## 2024-10-14 MED ORDER — DROPERIDOL 2.5 MG/ML IJ SOLN: 0.6250 mg | Freq: Once | INTRAMUSCULAR | Status: DC | PRN

## 2024-10-14 MED ORDER — PHENYLEPHRINE 80 MCG/ML (10ML) SYRINGE FOR IV PUSH (FOR BLOOD PRESSURE SUPPORT)
PREFILLED_SYRINGE | INTRAVENOUS | Status: DC | PRN
  Administered 2024-10-14: 80 ug via INTRAVENOUS
  Administered 2024-10-14 (×2): 160 ug via INTRAVENOUS
  Administered 2024-10-14: 80 ug via INTRAVENOUS

## 2024-10-14 MED ORDER — ALBUMIN HUMAN 5 % IV SOLN: INTRAVENOUS | Status: DC | PRN

## 2024-10-14 MED ORDER — PHENYLEPHRINE HCL (PRESSORS) 10 MG/ML IV SOLN
INTRAVENOUS | Status: AC
  Filled 2024-10-14: qty 1

## 2024-10-14 MED ORDER — OXYCODONE HCL 5 MG PO TABS: 5.0000 mg | ORAL_TABLET | Freq: Once | ORAL | Status: DC | PRN

## 2024-10-14 MED ORDER — CHLORHEXIDINE GLUCONATE 0.12 % MT SOLN: 15.0000 mL | Freq: Once | OROMUCOSAL | Status: AC

## 2024-10-14 MED ORDER — 0.9 % SODIUM CHLORIDE (POUR BTL) OPTIME
TOPICAL | Status: DC | PRN
  Administered 2024-10-14: 1000 mL

## 2024-10-14 MED ORDER — VANCOMYCIN HCL 1000 MG IV SOLR
INTRAVENOUS | Status: DC | PRN
  Administered 2024-10-14: 1000 mg

## 2024-10-14 MED ORDER — EPHEDRINE SULFATE-NACL 50-0.9 MG/10ML-% IV SOSY
PREFILLED_SYRINGE | INTRAVENOUS | Status: DC | PRN
  Administered 2024-10-14: 10 mg via INTRAVENOUS
  Administered 2024-10-14: 5 mg via INTRAVENOUS

## 2024-10-14 MED ORDER — FENTANYL CITRATE (PF) 100 MCG/2ML IJ SOLN: 25.0000 ug | INTRAMUSCULAR | Status: DC | PRN

## 2024-10-14 MED ORDER — DEXAMETHASONE SOD PHOSPHATE PF 10 MG/ML IJ SOLN
INTRAMUSCULAR | Status: DC | PRN
  Administered 2024-10-14: 5 mg via INTRAVENOUS

## 2024-10-14 MED ORDER — MIDAZOLAM HCL 2 MG/2ML IJ SOLN
INTRAMUSCULAR | Status: AC
  Filled 2024-10-14: qty 2

## 2024-10-14 MED ORDER — TRANEXAMIC ACID-NACL 1000-0.7 MG/100ML-% IV SOLN
1000.0000 mg | Freq: Once | INTRAVENOUS | Status: DC
Start: 2024-10-14 — End: 2024-10-18
  Filled 2024-10-14: qty 100

## 2024-10-14 MED ORDER — CHLORHEXIDINE GLUCONATE 0.12 % MT SOLN
OROMUCOSAL | Status: AC
  Administered 2024-10-14: 15 mL via OROMUCOSAL
  Filled 2024-10-14: qty 15

## 2024-10-14 MED ORDER — CEFAZOLIN SODIUM-DEXTROSE 2-4 GM/100ML-% IV SOLN
2.0000 g | Freq: Three times a day (TID) | INTRAVENOUS | Status: AC
  Administered 2024-10-15 (×3): 2 g via INTRAVENOUS
  Filled 2024-10-14 (×3): qty 100

## 2024-10-14 MED ORDER — CHLORHEXIDINE GLUCONATE CLOTH 2 % EX PADS
6.0000 | MEDICATED_PAD | Freq: Every day | CUTANEOUS | Status: DC
  Administered 2024-10-15 – 2024-10-17 (×3): 6 via TOPICAL

## 2024-10-14 MED ORDER — SODIUM CHLORIDE (PF) 0.9 % IJ SOLN
INTRAMUSCULAR | Status: AC
  Filled 2024-10-14: qty 10

## 2024-10-14 MED ORDER — FENTANYL CITRATE (PF) 100 MCG/2ML IJ SOLN
INTRAMUSCULAR | Status: AC
  Filled 2024-10-14: qty 2

## 2024-10-14 MED ORDER — LACTATED RINGERS IV SOLN: INTRAVENOUS | Status: DC

## 2024-10-14 MED ORDER — ACETAMINOPHEN 10 MG/ML IV SOLN: 1000.0000 mg | Freq: Once | INTRAVENOUS | Status: DC | PRN

## 2024-10-14 MED ORDER — FENTANYL CITRATE (PF) 250 MCG/5ML IJ SOLN
INTRAMUSCULAR | Status: DC | PRN
  Administered 2024-10-14 (×2): 50 ug via INTRAVENOUS

## 2024-10-14 MED ORDER — PROPOFOL 500 MG/50ML IV EMUL
INTRAVENOUS | Status: DC | PRN
  Administered 2024-10-14: 75 ug/kg/min via INTRAVENOUS
  Administered 2024-10-14: 20 mg via INTRAVENOUS
  Administered 2024-10-14: 25 mg via INTRAVENOUS

## 2024-10-14 MED ORDER — VANCOMYCIN HCL 1000 MG IV SOLR
INTRAVENOUS | Status: AC
  Filled 2024-10-14: qty 20

## 2024-10-14 MED ORDER — PHENYLEPHRINE HCL-NACL 20-0.9 MG/250ML-% IV SOLN
INTRAVENOUS | Status: DC | PRN
Start: 2024-10-14 — End: 2024-10-14
  Administered 2024-10-14: 30 ug/min via INTRAVENOUS

## 2024-10-14 MED ORDER — BUPIVACAINE HCL (PF) 0.25 % IJ SOLN
INTRAMUSCULAR | Status: AC
Start: 2024-10-14 — End: 2024-10-14
  Filled 2024-10-14: qty 30

## 2024-10-14 SURGICAL SUPPLY — 74 items
BAG COUNTER SPONGE SURGICOUNT (BAG) ×1 IMPLANT
BIT DRILL QC LG EVOS 3.7 SN (DRILL) IMPLANT
BIT DRILL SURG EVOS QC 2.5 LL (DRILL) IMPLANT
BLADE CLIPPER SURG (BLADE) IMPLANT
BLADE SURG 10 STRL SS (BLADE) ×1 IMPLANT
BNDG COHESIVE 6X5 TAN ST LF (GAUZE/BANDAGES/DRESSINGS) ×2 IMPLANT
BNDG GAUZE DERMACEA FLUFF 4 (GAUZE/BANDAGES/DRESSINGS) ×1 IMPLANT
CLEANER TIP ELECTROSURG 2X2 (MISCELLANEOUS) ×1 IMPLANT
COVER MAYO STAND STRL (DRAPES) ×1 IMPLANT
COVER SURGICAL LIGHT HANDLE (MISCELLANEOUS) ×2 IMPLANT
CUFF TOURN SGL QUICK 42 (TOURNIQUET CUFF) IMPLANT
CUFF TRNQT CYL 34X4.125X (TOURNIQUET CUFF) IMPLANT
DRAPE C-ARM 42X72 X-RAY (DRAPES) IMPLANT
DRAPE IMP U-DRAPE 54X76 (DRAPES) ×1 IMPLANT
DRAPE INCISE IOBAN 66X45 STRL (DRAPES) ×1 IMPLANT
DRAPE SURG ORHT 6 SPLT 77X108 (DRAPES) ×1 IMPLANT
DRAPE U-SHAPE 47X51 STRL (DRAPES) ×1 IMPLANT
DRILL EVOS 3.5 W/AO QC (DRILL) IMPLANT
DRILL QC EVO OVER 4.5 (DRILL) IMPLANT
DRILL TARG QC EVOS 2.5 LG (DRILL) IMPLANT
DRSG ADAPTIC 3X8 NADH LF (GAUZE/BANDAGES/DRESSINGS) ×1 IMPLANT
DRSG AQUACEL AG ADV 3.5X 6 (GAUZE/BANDAGES/DRESSINGS) IMPLANT
DRSG AQUACEL AG ADV 3.5X14 (GAUZE/BANDAGES/DRESSINGS) IMPLANT
DRSG TEGADERM 4X4.75 (GAUZE/BANDAGES/DRESSINGS) IMPLANT
DURAPREP 26ML APPLICATOR (WOUND CARE) ×1 IMPLANT
ELECTRODE REM PT RTRN 9FT ADLT (ELECTROSURGICAL) ×1 IMPLANT
EVACUATOR 1/8 PVC DRAIN (DRAIN) IMPLANT
GAUZE PAD ABD 8X10 STRL (GAUZE/BANDAGES/DRESSINGS) ×2 IMPLANT
GAUZE SPONGE 4X4 12PLY STRL (GAUZE/BANDAGES/DRESSINGS) ×2 IMPLANT
GLOVE BIOGEL PI IND STRL 9 (GLOVE) ×1 IMPLANT
GLOVE SURG ORTHO 9.0 STRL STRW (GLOVE) ×1 IMPLANT
GOWN STRL REUS W/ TWL XL LVL3 (GOWN DISPOSABLE) ×3 IMPLANT
KIT BASIN OR (CUSTOM PROCEDURE TRAY) ×1 IMPLANT
KIT TURNOVER KIT B (KITS) ×1 IMPLANT
KWIRE FIX TT 2X350 (WIRE) IMPLANT
MANIFOLD NEPTUNE II (INSTRUMENTS) ×1 IMPLANT
NDL 22X1.5 STRL (OR ONLY) (MISCELLANEOUS) ×1 IMPLANT
NEEDLE 22X1.5 STRL (OR ONLY) (MISCELLANEOUS) ×1 IMPLANT
PACK ORTHO EXTREMITY (CUSTOM PROCEDURE TRAY) ×1 IMPLANT
PACK UNIVERSAL I (CUSTOM PROCEDURE TRAY) ×1 IMPLANT
PAD ARMBOARD POSITIONER FOAM (MISCELLANEOUS) ×2 IMPLANT
PLATE DIST FEM EVOS 270 13H RT (Plate) IMPLANT
SCREW CORT EVOS 4.5X42 (Screw) IMPLANT
SCREW CORT ST EVOS 3.5X60 (Screw) IMPLANT
SCREW CORT ST EVOS 3.5X65 (Screw) IMPLANT
SCREW CORT ST EVOS 4.5X36 (Screw) IMPLANT
SCREW CORT ST EVOS 4.5X38 (Screw) IMPLANT
SCREW CORT ST EVOS 4.5X40 (Screw) IMPLANT
SCREW CORT ST EVOS 4.5X46 (Screw) IMPLANT
SCREW CORT ST EVOS 4.5X52 (Screw) IMPLANT
SCREW CORT ST EVOS 4.5X58 (Screw) IMPLANT
SCREW CORT ST EVOS 4.5X66 (Screw) IMPLANT
SCREW CORT ST EVOS 4.5X72 STL (Screw) IMPLANT
SCREW LOCK ST EVOS 4.5X30 (Screw) IMPLANT
SCREW LOCK ST EVOS 4.5X78 STL (Screw) IMPLANT
SCREW LOCK ST EVOS 4.5X80 (Screw) IMPLANT
SOLN 0.9% NACL POUR BTL 1000ML (IV SOLUTION) ×1 IMPLANT
SOLN STERILE WATER BTL 1000 ML (IV SOLUTION) ×2 IMPLANT
SPONGE T-LAP 18X18 ~~LOC~~+RFID (SPONGE) ×1 IMPLANT
STAPLER SKIN PROX 35W (STAPLE) IMPLANT
STOCKINETTE IMPERVIOUS LG (DRAPES) ×1 IMPLANT
SUCTION TUBE FRAZIER 10FR DISP (SUCTIONS) ×1 IMPLANT
SUT ETHILON 2 0 PSLX (SUTURE) IMPLANT
SUT ETHILON 4 0 PS 2 18 (SUTURE) IMPLANT
SUT VIC AB 0 CT1 18XCR BRD8 (SUTURE) IMPLANT
SUT VIC AB 0 CT1 27XBRD ANBCTR (SUTURE) ×1 IMPLANT
SUT VIC AB 1 CTB1 27 (SUTURE) ×1 IMPLANT
SUT VIC AB 2-0 CT2 18 VCP726D (SUTURE) IMPLANT
SUT VIC AB 2-0 CTB1 (SUTURE) ×1 IMPLANT
SYR 20ML ECCENTRIC (SYRINGE) ×1 IMPLANT
TOWEL GREEN STERILE (TOWEL DISPOSABLE) ×1 IMPLANT
TOWEL GREEN STERILE FF (TOWEL DISPOSABLE) ×1 IMPLANT
TUBE CONNECTING 12X1/4 (SUCTIONS) ×1 IMPLANT
YANKAUER SUCT BULB TIP NO VENT (SUCTIONS) ×1 IMPLANT

## 2024-10-14 NOTE — Progress Notes (Signed)
 Pt arrived back to unit confused postop. Wife and daughter in room. Pt oriented to self and situation, fuzzy on day/date and location. VSS. Pt is on 3LNC. Pulse to RLE weak but palpable with extended cap refill >3 sec, foot cool and slightly pale (doppler was needed in PACU and MD evaluated RLE. ) Educated pt and family bedside on current POC. Pt has Dexcom on back RUA. BP cuff moved to LUA. Pt CBG 151. Pt stating he will give himself 20 units of insulin . Informed pt per our protocol he should not give himself any insulin  and to allow us  to monitor his blood sugar. Pt then asked for 15 units insulin . Pt is slightly altered but adamant he controls his own blood sugars and insulin . Wife and daughter nearby not commenting. I asked pt is he takes long acting insulin  and is that what he is referring to?. Pt states no and verifies he is type 2 DM. Informed pt he is not to self administer any insulin  to himself as he would be using short acting and 15 or 20 units would be critical. Pt and family repeatedly informed to not take any insulin  and to allow us  to monitor his CBG's and administer insulin . Pt then states he has always done what he wants and his endocrinologist is aware. I informed pt with family present he should not give himself any insulin  that we are not providing. This was repeated multiple times. I assured pt we can check his CBG's more often if needed but pt is focused on his phone and monitoring Dexcom app stating 'it can be off by as much as 100 pints'. I assured pt that our glucometers are calibrated every 24 hrs and are very accurate. Pt gave me sideways glance and stated he will not self administer any insulin  at this time. Family aware. Currently pt is bedbound NWB. Family brought him his small black backpack and he stated he had his personal belongings in there. Asked pt if he had any home medications in backpack and pt stated yes. Informed pt he cannot take any outside medications and to take  medications back home. Daughter to leave and come back in am and wife to stay the night.

## 2024-10-14 NOTE — Plan of Care (Signed)
  Problem: Skin Integrity: Goal: Risk for impaired skin integrity will decrease Outcome: Progressing   Problem: Education: Goal: Knowledge of General Education information will improve Description: Including pain rating scale, medication(s)/side effects and non-pharmacologic comfort measures Outcome: Progressing   Problem: Activity: Goal: Risk for activity intolerance will decrease Outcome: Progressing   Problem: Pain Managment: Goal: General experience of comfort will improve and/or be controlled Outcome: Progressing   Problem: Safety: Goal: Ability to remain free from injury will improve Outcome: Progressing   Problem: Skin Integrity: Goal: Risk for impaired skin integrity will decrease Outcome: Progressing

## 2024-10-14 NOTE — Anesthesia Procedure Notes (Signed)
 Spinal  Patient location during procedure: OR Start time: 10/14/2024 3:25 PM End time: 10/14/2024 3:31 PM Reason for block: surgical anesthesia Staffing Performed: anesthesiologist  Anesthesiologist: Lucious Debby BRAVO, MD Performed by: Lucious Debby BRAVO, MD Authorized by: Lucious Debby BRAVO, MD   Preanesthetic Checklist Completed: patient identified, IV checked, risks and benefits discussed, surgical consent, monitors and equipment checked, pre-op evaluation and timeout performed Spinal Block Patient position: right lateral decubitus Prep: DuraPrep Patient monitoring: heart rate, cardiac monitor, continuous pulse ox and blood pressure Approach: midline Location: L3-4 Injection technique: single-shot Needle Needle type: Quincke  Needle gauge: 22 G Additional Notes Consent was obtained prior to the procedure with all questions answered and concerns addressed. Risks including, but not limited to, bleeding, infection, nerve damage, paralysis, failed block, inadequate analgesia, allergic reaction, high spinal, itching, and headache were discussed and the patient wished to proceed. Functioning IV was confirmed and monitors were applied. Sterile prep and drape, including hand hygiene, mask, and sterile gloves were used. The patient was positioned and the spine was prepped. The skin was anesthetized with lidocaine . Free flow of clear CSF was obtained prior to injecting local anesthetic into the CSF. The spinal needle aspirated freely following injection. The needle was carefully withdrawn. The patient tolerated the procedure well.   Debby Lucious, MD

## 2024-10-14 NOTE — Transfer of Care (Signed)
 Immediate Anesthesia Transfer of Care Note  Patient: Antonio Patterson  Procedure(s) Performed: OPEN REDUCTION INTERNAL FIXATION (ORIF) DISTAL FEMUR FRACTURE (Right)  Patient Location: PACU  Anesthesia Type:Spinal  Level of Consciousness: drowsy and patient cooperative  Airway & Oxygen Therapy: Patient Spontanous Breathing and Patient connected to nasal cannula oxygen  Post-op Assessment: Report given to RN and Post -op Vital signs reviewed and stable  Post vital signs: Reviewed and stable  Last Vitals:  Vitals Value Taken Time  BP 106/60 10/14/24 20:00  Temp 98.2 10/14/24  2000  Pulse 70 10/14/24 20:00  Resp 22 10/14/24 20:00  SpO2 93 % 10/14/24 20:00  Vitals shown include unfiled device data.     Complications: No notable events documented.

## 2024-10-14 NOTE — ED Notes (Signed)
 CCMD called, pt already on monitor

## 2024-10-14 NOTE — Progress Notes (Signed)
 Orthopedic Surgery Progress Note   Assessment: Patient is a 73 y.o. male with right distal femur fracture status post ORIF  Right tibial plateau fracture (non-op) Left calcaneus fracture (non-op)   Plan: -Operative plans: complete -Diet: diabetic -DVT ppx: aspirin  81mg  BID -Antibiotics: ancef  x2 post-op doses -Weight bearing status: NWB BLE -PT evaluate and treat -Pain control -Dispo: to floor from PACU  ___________________________________________________________________________  Subjective: No acute events since surgery. Recovering in PACU. Pain controlled.   Physical Exam:  General: no acute distress, appears stated age Neurologic: alert, answering questions appropriately, following commands Respiratory: unlabored breathing on room air, symmetric chest rise Psychiatric: appropriate affect, normal cadence to speech  MSK:    -Right lower extremity  Dressings over leg c/d/i EHL/TA/GSC intact Plantarflexes and dorsiflexes toes Sensation intact to light touch in sural, saphenous, tibial, deep peroneal, and superficial peroneal nerve distributions Foot warm and well perfused, dopplerable DP and PT pulses  -Left lower extremity  Splint in place Fires hip flexors, quadriceps, hamstrings, tibialis anterior, gastrocnemius and soleus, extensor hallucis longus Plantarflexes and dorsiflexes toes Sensation intact to light touch in sural, saphenous, tibial, deep peroneal, and superficial peroneal nerve distributions Foot warm and well perfused   Yesterday's total administered Morphine  Milligram Equivalents: 20   Patient name: Antonio Patterson Patient MRN: [id number] Date: 10/14/24

## 2024-10-14 NOTE — Progress Notes (Signed)
 Orthopedic Surgery Progress Note   Assessment: Patient is a 73 y.o. male with right distal femur fracture with intraarticular extension  Plan: -Planning for operative fixation tonight -Diet: NPO for procedure -DVT ppx: aspirin  81mg  BID post-operatively -Ancef  and TXA on call to OR -Weight bearing status: NWB RLE -PT/OT evaluate and treat -Pain control -Dispo: pending completion of operative plans  ___________________________________________________________________________  Subjective: No acute events overnight. Pain controlled. Answered his and his wife's questions about the plan to their satisfaction.    Physical Exam:  General: no acute distress, appears stated age Neurologic: alert, answering questions appropriately, following commands Respiratory: unlabored breathing on room air, symmetric chest rise Psychiatric: appropriate affect, normal cadence to speech  MSK:   -Right lower extremity  Leg short when compared to contralateral side EHL/TA/GSC intact Plantarflexes and dorsiflexes toes Sensation intact to light touch in sural, saphenous, tibial, deep peroneal, and superficial peroneal nerve distributions Foot warm and well perfused, dopplerable DP and PT pulses  -Left lower extremity  Short leg splint in place EHL/TA/GSC intact Plantarflexes and dorsiflexes toes Sensation intact to light touch in sural, saphenous, tibial, deep peroneal, and superficial peroneal nerve distributions Foot warm and well perfused, dopplerable DP and PT pulses   Yesterday's total administered Morphine  Milligram Equivalents: 20   Patient name: Antonio Patterson Patient MRN: [id number] Date: 10/14/24

## 2024-10-14 NOTE — Brief Op Note (Signed)
 10/13/2024 - 10/14/2024  8:00 PM  PATIENT:  Antonio Patterson  72 y.o. male  PRE-OPERATIVE DIAGNOSIS:  RIGHT DISTAL FEMUR FRACTURE  POST-OPERATIVE DIAGNOSIS:  RIGHT DISTAL FEMUR FRACTURE  PROCEDURE:  Procedure(s): OPEN REDUCTION INTERNAL FIXATION (ORIF) DISTAL FEMUR FRACTURE (Right)  SURGEON:  Surgeons and Role:    DEWAINE Georgina Ozell DELENA, MD - Primary  PHYSICIAN ASSISTANT: none  ASSISTANTS: none   ANESTHESIA:   spinal  EBL:  300 mL   BLOOD ADMINISTERED:none  DRAINS: none   LOCAL MEDICATIONS USED:  NONE  SPECIMEN:  No Specimen  DISPOSITION OF SPECIMEN:  N/A  COUNTS:  YES  TOURNIQUET:  NONE  DICTATION: .Note written in EPIC  PLAN OF CARE: Admit to inpatient   PATIENT DISPOSITION:  PACU - hemodynamically stable.   Delay start of Pharmacological VTE agent (>24hrs) due to surgical blood loss or risk of bleeding: no

## 2024-10-14 NOTE — Anesthesia Preprocedure Evaluation (Signed)
 Anesthesia Evaluation  Patient identified by MRN, date of birth, ID band Patient awake    Reviewed: Allergy & Precautions, NPO status , Patient's Chart, lab work & pertinent test results  History of Anesthesia Complications Negative for: history of anesthetic complications  Airway Mallampati: II  TM Distance: >3 FB Neck ROM: Full    Dental  (+) Partial Lower, Edentulous Upper   Pulmonary shortness of breath and with exertion, sleep apnea , neg COPD, Patient abstained from smoking.Not current smoker, former smoker   Pulmonary exam normal breath sounds clear to auscultation       Cardiovascular Exercise Tolerance: Good METShypertension, pulmonary hypertension+ CAD, + CABG and + Peripheral Vascular Disease  (-) Past MI (-) dysrhythmias  Rhythm:Regular Rate:Normal - Systolic murmurs Mild-moderate pulm HTN on cath in 2023  TTE;  1. Left ventricular ejection fraction, by estimation, is 60 to 65%. The  left ventricle has normal function. The left ventricle has no regional  wall motion abnormalities. Left ventricular diastolic parameters are  consistent with Grade I diastolic  dysfunction (impaired relaxation).   2. Right ventricular systolic function is normal. The right ventricular  size is moderately enlarged. There is normal pulmonary artery systolic  pressure.   3. Right atrial size was mildly dilated.   4. The mitral valve is normal in structure. No evidence of mitral valve  regurgitation. No evidence of mitral stenosis.   5. The aortic valve is tricuspid. Aortic valve regurgitation is not  visualized. No aortic stenosis is present.   6. The inferior vena cava is normal in size with greater than 50%  respiratory variability, suggesting right atrial pressure of 3 mmHg.     Neuro/Psych negative neurological ROS  negative psych ROS   GI/Hepatic hiatal hernia,neg GERD  ,,(+)     (-) substance abuse    Endo/Other   diabetes, Insulin  Dependent    Renal/GU negative Renal ROS     Musculoskeletal   Abdominal   Peds  Hematology Denies blood thinner use or bleeding disorders. Recent labs reviewed.   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, CHRONIC, BILATERAL 11/13/2008: UNSPECIFIED PERIPHERAL VASCULAR DISEASE  Reproductive/Obstetrics                              Anesthesia Physical Anesthesia Plan  ASA: 3  Anesthesia Plan: Spinal   Post-op Pain Management: Tylenol  PO (pre-op)*   Induction: Intravenous  PONV Risk Score and Plan: 1 and Ondansetron , Dexamethasone, Propofol  infusion, TIVA and Midazolam   Airway Management Planned: Natural Airway  Additional Equipment: None  Intra-op Plan:   Post-operative Plan:   Informed Consent: I have reviewed the patients History and Physical, chart, labs and discussed the procedure including the risks, benefits and alternatives for the proposed anesthesia with the patient or authorized representative who has indicated his/her understanding and acceptance.       Plan Discussed with: CRNA and Surgeon  Anesthesia Plan Comments: (Gave patient options of GETA vs spinal and discussed r/b/a of both and patient prefers to avoid endotracheal tube and general anesthesia if possible.  Discussed R/B/A of  neuraxial anesthesia technique with patient: - rare risks of spinal/epidural hematoma, nerve damage, infection - Risk of PDPH - Risk of nausea and  vomiting - Risk of conversion to general anesthesia and its associated risks, including sore throat, damage to lips/eyes/teeth/oropharynx, and rare risks such as cardiac and respiratory events. - Risk of allergic reactions  Discussed the role of CRNA in patient's perioperative care.  Patient voiced understanding.)         Anesthesia Quick Evaluation

## 2024-10-15 LAB — BASIC METABOLIC PANEL WITH GFR
Anion gap: 14 (ref 5–15)
BUN: 15 mg/dL (ref 8–23)
CO2: 24 mmol/L (ref 22–32)
Calcium: 9.1 mg/dL (ref 8.9–10.3)
Chloride: 99 mmol/L (ref 98–111)
Creatinine, Ser: 1.11 mg/dL (ref 0.61–1.24)
GFR, Estimated: >60 mL/min (ref >60)
Glucose, Bld: 241 mg/dL — ABNORMAL HIGH (ref 70–99)
Potassium: 4.6 mmol/L (ref 3.5–5.1)
Sodium: 137 mmol/L (ref 135–145)

## 2024-10-15 LAB — CBC
HCT: 32.1 % — ABNORMAL LOW (ref 39.0–52.0)
Hemoglobin: 10.4 g/dL — ABNORMAL LOW (ref 13.0–17.0)
MCH: 29.4 pg (ref 26.0–34.0)
MCHC: 32.4 g/dL (ref 30.0–36.0)
MCV: 90.7 fL (ref 80.0–100.0)
Platelets: 214 K/uL (ref 150–400)
RBC: 3.54 MIL/uL — ABNORMAL LOW (ref 4.22–5.81)
RDW: 14.6 % (ref 11.5–15.5)
WBC: 13.9 K/uL — ABNORMAL HIGH (ref 4.0–10.5)
nRBC: 0 % (ref 0.0–0.2)

## 2024-10-15 LAB — GLUCOSE, CAPILLARY
Glucose-Capillary: 211 mg/dL — ABNORMAL HIGH (ref 70–99)
Glucose-Capillary: 277 mg/dL — ABNORMAL HIGH (ref 70–99)
Glucose-Capillary: 158 mg/dL — ABNORMAL HIGH (ref 70–99)
Glucose-Capillary: 241 mg/dL — ABNORMAL HIGH (ref 70–99)

## 2024-10-15 MED ORDER — RIVAROXABAN 10 MG PO TABS
10.0000 mg | ORAL_TABLET | Freq: Every day | ORAL | Status: DC
  Administered 2024-10-15 – 2024-10-18 (×4): 10 mg via ORAL
  Filled 2024-10-15 (×4): qty 1

## 2024-10-15 MED ORDER — HYDROCODONE-ACETAMINOPHEN 5-325 MG PO TABS
1.0000 | ORAL_TABLET | ORAL | Status: DC | PRN
  Administered 2024-10-15: 1 via ORAL
  Administered 2024-10-16: 2 via ORAL
  Administered 2024-10-16 – 2024-10-18 (×3): 1 via ORAL
  Filled 2024-10-15: qty 1
  Filled 2024-10-15 (×2): qty 2
  Filled 2024-10-15 (×3): qty 1

## 2024-10-15 NOTE — Op Note (Signed)
 Orthopedic Surgery Operative Report   Procedure: Right supracondylar femur fracture with intraarticular extension open reduction internal fixation   Modifier: none   Date of procedure: 10/14/2024   Patient name: Antonio Patterson   MRN: [id number]  DOB: [birth year]   Surgeon: Ozell Ada, MD Assistant: none Pre-operative diagnosis: right distal femur fracture with intraarticular extension Post-operative diagnosis: same as above Findings: right, displaced supracondylar femur fracture with intraarticular extension   Specimens: none Anesthesia: general EBL: 300cc Complications: none Pre-incision antibiotic: ancef  TXA given prior to incision as well   Implants:  Implant Name Type Inv. Item Serial No. Manufacturer Lot No. LRB No. Used Action  LOG [id number] - PLATES SYNTHES 3.5 LOCKING PROXIMAL - 1          SCREW CORT ST EVOS 3.5X65 - [id number] Screw SCREW CORT ST EVOS 3.5X65  [employer]  Right 1 Implanted  SCREW CORT ST EVOS 3.5X60 - [id number] Screw SCREW CORT ST EVOS 3.5X60  [employer]  Right 1 Implanted  PLATE DIST FEM EVOS 270 13H RT - [id number] Plate PLATE DIST FEM EVOS 270 13H RT  [employer]  Right 1 Implanted  SCREW CORT ST EVOS 4.5X40 - [id number] Screw SCREW CORT ST EVOS 4.5X40  [employer]  Right 1 Implanted and Explanted  SCREW CORT ST EVOS 4.5X46 - [id number] Screw SCREW CORT ST EVOS 4.5X46  [employer]  Right 1 Implanted and Explanted  SCREW CORT EVOS 4.5X42 - [id number] Screw SCREW CORT EVOS 4.5X42  [employer]  Right 1 Implanted  SCREW LOCK ST EVOS 4.5X80 - [id number] Screw SCREW LOCK ST EVOS 4.5X80  [employer]  Right 1 Implanted  SCREW CORT ST EVOS 4.5X38 - [id number] Screw SCREW CORT ST EVOS 4.5X38  [employer]  Right 1 Implanted  SCREW CORT ST EVOS 4.5X36 - [id number] Screw SCREW CORT ST EVOS 4.5X36  [employer]   Right 1 Implanted  SCREW LOCK ST EVOS 4.5X78 STL - [id number] Screw SCREW LOCK ST EVOS 4.5X78 STL  [employer]  Right 2 Implanted  SCREW CORT ST EVOS 4.5X38 - [id number] Screw SCREW CORT ST EVOS 4.5X38  [employer]  Right 2 Implanted  SCREW CORT ST EVOS 4.5X72 STL - [id number] Screw SCREW CORT ST EVOS 4.5X72 STL  [employer]  Right 1 Implanted  SCREW LOCK ST EVOS 4.5X30 - [id number] Screw SCREW LOCK ST EVOS 4.5X30  [employer]  Right 1 Implanted  SCREW LOCK ST EVOS 4.5X30 - [id number] Screw SCREW LOCK ST EVOS 4.5X30  [employer]  Right 1 Implanted       Indication for procedure: Patient is a 73 y.o. male who presented to the ER after a fall off of a ladder at his home. He had pain in the left foot and right knee. Work up revealed a right supracondylar femur fracture with intraarticular extension, a right tibial plateau fracture, and a left calcaneus fracture. Nonoperative treatment was recommended for the tibial plateau fracture and the calcaneus fracture. For the supracondylar femur fracture with intraarticular extension, I recommended operative management in the form of open reduction internal fixation. Explained the risks of this procedure included, but were not limited to: nonunion, malunion, neurovascular injury, fixation failure/malposition, infection, bleeding, stiffness, need for additional procedures, posttraumatic arthritis, deep vein thrombosis, pulmonary embolism, MI, and death. The alternatives of this surgery would be to  treat the fracture with immobilization or to perform no intervention. After our discussion, patient elected to proceed with surgery.    Procedure Description: The patient was met in the pre-operative holding area. The patient's identity and consent were verified. The operative site was marked by myself. The patient's remaining questions about the surgery were answered. The patient was  brought back to the operating room. Anesthesia was induced. The patient was transferred to the [facility] table. All bony prominences were well padded. The surgical area was cleansed with a scrub brush and alcohol. Ancef  and TXA were administered by anesthesia. The patient's skin was then prepped and draped in a standard, sterile fashion. A time out was performed that identified the patient, the procedure, and the operative site. All team members agreed with what was stated in the time out.    A slight curvilinear incision was made over the anterolateral distal femur. The incision was taken sharply down through the skin and dermis. The incision was then carried down sharply to the level of the bone and capsule. A lateral parapatellar incision was made through the capsule to visualize the intraarticular surface. A stab incision was over the distal medial condyle.Incision was taken sharply down through the skin and dermis. A tonsil was used to bluntly dissect to the level of the bone.  A reduction maneuver was performed with slight valgus force.  A King tong clamp was then applied across the distal femur to reduce the fracture fragments.  Direct visualization via the lateral parapatellar arthrotomy confirm satisfactory reduction.  Decision was made to place 2 to 3.5 mm lag screws across the intra-articular fracture site.  To place the screws, a over drill was used under AP fluoroscopic guidance to drill perpendicular to the fracture.  The overdrilled was taken from the first cortex to the fracture site.  Then, an under drill was used to drill from the fracture site to the distal cortex.  A depth gauge was used to estimate the screw length.  That length 3.5 millimeter screw was then placed through the drilled hole.  The same process was then repeated to place the second lag screw.  The King tong clamp was then removed.  A Cobb was used to create a submuscular plane over the distal lateral femur.  A lateral locking plate  was then placed along the lateral distal femur and slid proximally via the submuscular plane.  Proper plate placement was confirmed on AP and lateral fluoroscopic images.  A drill bit was used to secure the plate distally.  Traction was then applied to the limb to restore the length.  On the last lateral fluoroscopic image, there was some apex anterior deformity.  A stab incision was made over the apex of the fracture.  This was taken sharply down through skin, dermis, and fascia.  A tonsil was used to dissect bluntly down to level of bone.  A Cobb was used to reduce the fracture and reduce the apex anterior angulation.  AP and lateral fluoroscopic images were again obtained with traction applied which showed satisfactory reduction of the supracondylar femur fracture.  A stab incision was then made in the skin overlying the most proximal hole of the plate.  The incision was taken sharply down through skin, dermis, and fascia.  A locking tower was placed through the guide into the plate.  As the traction force was applied, a drill bit was placed across the proximal femur bicortically to maintain the length.  The fracture was in  valgus alignment.  In order to reduce this valgus alignment, decision was made to use a screw to bring the fracture to the plate.  A stab incision was made over the distal femur just distal to the supracondylar portion of the femur fracture.  The incision was taken sharply down through skin, dermis, and fascia.  A screw guide tower was placed through the targeting jig onto the plate.  The femur was drilled bicortically.  The screw length was estimated off the drill bit.  That length cortical screw was then inserted through the targeting guide and the fracture was taken out of the increased valgus alignment under AP fluoroscopic guidance.  Satisfactory alignment was obtained.  Decision was then made to proceed with the distal locking screws.  A locking targeting guide was placed onto the distal  locking hole.  The distal femur was drilled bicortically.  The screw length was estimated off the drill bit.  That length locking screw was then inserted at the distal femur.  This was done to insert several screws in the distal segment.  Attention was then turned towards the proximal segment.  To answer the proximal screws, a stab incision was made over one of the holes proximal to the supracondylar femur fracture.  The incision was taken sharply down through the skin and dermis and fascia.  The screw targeting guide was placed through the jig onto the plate.  A drill was used to drill the femur bicortically.  The screw length was estimated off the drill bit.  That length cortical screw was then inserted until there was good purchase.  The same process was repeated to insert 2 more cortical screws proximal to the fracture site.  The drill bit in the most proximal hole that had been inserted earlier in the case was then removed.  The jig was then removed off the plate.  Decision was made to place 1 more distal locking screw.  A locking guide tower was attached to the plate.  The distal femur was drilled bicortically through the lateral condyle.  A depth gauge was used to estimate the screw length.  That length locking screw was then inserted into the drill hole until it interdigitated with the plate.  Final AP and lateral fluoroscopic images confirm satisfactory reduction and placement of the fixation.  The wounds were copiously irrigated with sterile saline.  Vancomycin  powder was placed in the wounds.  The fascia was closed with 0 vicryl.  The lateral parapatellar arthrotomy was closed with 0 Vicryl.  The deep dermal layer was closed with 2-0 vicryl. The skin was closed with staples. Dressings were applied. All counts were correct at the end of the case. Patient was transferred back to a hospital bed. The patient was awakened from anesthesia and brought back to the post-anesthesia care unit in stable  condition.     Post-operative plan: The patient will recover in the post-anesthesia care unit and then go to the floor. The patient will receive two post-operative doses of ancef .  The patient will receive another dose of TXA.  The patient will be nonweightbearing on the right lower extremity.. The patient will work with physical therapy. The patient's disposition will be determined over the next couple of days.    Ozell Ada, MD Orthopedic Surgeon

## 2024-10-15 NOTE — Progress Notes (Signed)
 Mobility Specialist Progress Note:   10/15/24 1538  Mobility  Activity Pivoted/transferred from chair to bed  Level of Assistance Minimal assist, patient does 75% or more (+2 for safety)  Assistive Device Sliding board  RLE Weight Bearing Per Provider Order NWB  LLE Weight Bearing Per Provider Order NWB  Activity Response Tolerated well  Mobility Referral Yes  Mobility visit 1 Mobility  Mobility Specialist Start Time (ACUTE ONLY) 1405  Mobility Specialist Stop Time (ACUTE ONLY) 1425  Mobility Specialist Time Calculation (min) (ACUTE ONLY) 20 min   Pt received in chair agreeable to mobility. Pt was able to laterally scoot with MinA w/ BLE to maintain NWB precautions. No c/o throughout. Left in bed w/ call bell and personal belongings in reach. All needs met. Bed alarm on.  Thersia Minder Mobility Specialist  Please contact vis Secure Chat or  Rehab Office [phone]

## 2024-10-15 NOTE — Plan of Care (Signed)
  Problem: Coping: Goal: Ability to adjust to condition or change in health will improve Outcome: Progressing   Problem: Metabolic: Goal: Ability to maintain appropriate glucose levels will improve Outcome: Progressing   Problem: Nutritional: Goal: Maintenance of adequate nutrition will improve Outcome: Progressing Goal: Progress toward achieving an optimal weight will improve Outcome: Progressing   Problem: Education: Goal: Knowledge of General Education information will improve Description: Including pain rating scale, medication(s)/side effects and non-pharmacologic comfort measures Outcome: Progressing   Problem: Tissue Perfusion: Goal: Adequacy of tissue perfusion will improve Outcome: Progressing   Problem: Activity: Goal: Risk for activity intolerance will decrease Outcome: Progressing   Problem: Nutrition: Goal: Adequate nutrition will be maintained Outcome: Progressing   Problem: Coping: Goal: Level of anxiety will decrease Outcome: Progressing   Problem: Pain Managment: Goal: General experience of comfort will improve and/or be controlled Outcome: Progressing   Problem: Safety: Goal: Ability to remain free from injury will improve Outcome: Progressing   Problem: Skin Integrity: Goal: Risk for impaired skin integrity will decrease Outcome: Progressing

## 2024-10-15 NOTE — Inpatient Diabetes Management (Addendum)
 Inpatient Diabetes Program Recommendations  AACE/ADA: New Consensus Statement on Inpatient Glycemic Control   Target Ranges:  Prepandial:   less than 140 mg/dL      Peak postprandial:   less than 180 mg/dL (1-2 hours)      Critically ill patients:  140 - 180 mg/dL   Lab Results  Component Value Date   GLUCAP 277 (H) 10/15/2024   HGBA1C 6.4 (H) 10/13/2024    Latest Reference Range & Units 10/14/24 18:06 10/14/24 19:58 10/14/24 21:13 10/14/24 23:10 10/15/24 06:31 10/15/24 11:47  Glucose-Capillary 70 - 99 mg/dL 867 (H) 836 (H) 848 (H) 197 (H) 211 (H) 277 (H)   Review of Glycemic Control  Diabetes history: DM2 Outpatient Diabetes medications:  Novolog  5-40 units TID   Current orders for Inpatient glycemic control:  Novolog  0-15 units TID + 0-5 units at bedtime   Inpatient Diabetes Program Recommendations:   Spoke with patient at bedside. Patient states he has had Type 2 Diabetes for many years and currently takes Novolog  5-40 units ISS and uses Dexcom G7 CGM at home. Patient expresses that his CGM is not accurate at times and still checks him CBG with his meter 6-8 times per day.  I educated patient that CGM readings are taken from interstitial fluid surrounding the cells below the skin. Blood Glucose readings using his meter are taken from blood the fingerstick. It is normal for readings to not be exactly the same (pt confirmed most of the time readings are similar but not the exact same reading. He is aware that Blood Glucose is ALWAYS most accurate).  Patient sees Angeline Iba, NP outpatient and he plans to follow-up once discharged. He states he currently administers Novolog  ISS every 2 - 4 hours. I educated patient on the importance of only administering Novolog  Correction Bolus either q4hrs or TID as prescribed. I encouraged him to discuss Long Acting insulin  when he follow-ups with NP to help eliminate correction bolus frequency. Patient was very appreciative and verbalized  understandings. No further questions at this time.   Lavanda Search, RN, MSN, [facility]  Inpatient Diabetes Coordinator  Pager [phone] (8a-5p)

## 2024-10-15 NOTE — Progress Notes (Signed)
 Orthopedic Note  I saw this patient on the floor in the evening about an hour after he was significantly fused.  His mentation has returned.  He is oriented to person, place, and time.  He is having minimal pain in his right thigh.  EHL/TA/GSC intact on the right.  Sensation intact to light touch in sural/saphenous/deep peroneal/superficial peroneal/tibial nerve distributions.  Foot warm and well-perfused on the right.  Palpable PT pulse, dopplerable DP and PT pulse on the right.  Since his mental status has returned to baseline, no intervention at this time.  Will continue to monitor.  Plan to see the patient first thing in the morning again.   Ozell DELENA Ada, MD Orthopedic Surgeon

## 2024-10-15 NOTE — Evaluation (Signed)
 Physical Therapy Evaluation Patient Details Name: Antonio Patterson MRN: [id number] DOB: 1951/09/11 Today's Date: 10/15/2024  History of Present Illness  Pt is a 73 y.o. male admitted 10/13/24 after fall off ladder sustaining R distal femur fracture with intraarticular extension, R tibial plateau fracture, and L calcaneus fracture. Pt s/p R femur ORIF 10/21. Other acute injuries will be managed non-operatively. PMHx: CAD s/p CABG, DM, HLD, and HTN.   Clinical Impression  Pt admitted with above diagnosis. PTA, pt was independent with functional mobility, ADLs/IADLs, driving, and working full-time. He lives with his wife in a two story house with 3 STE where he is able to reside on the main level. Pt currently with functional limitations due to the deficits listed below (see PT Problem List). He required minA for bed mobility and CGA for bed>chair lateral scoot transfer using sliding board. Pt maintained BLE NWB at all times. Educated pt on head-hips relationship with good carryover noted in functional mobility. Pt is currently limited by pain, weight bearing status, and decreased activity tolerance. Pt will benefit from acute skilled PT to increase his independence and safety with mobility to allow discharge. Recommend HHPT to increase AROM/strength, improve balance, decrease fall risk, and optimize safety within the home environment.     If plan is discharge home, recommend the following: A little help with walking and/or transfers;A little help with bathing/dressing/bathroom;Assistance with cooking/housework;Assist for transportation;Help with stairs or ramp for entrance   Can travel by private Programmer, multimedia cushion (measurements PT);Wheelchair (measurements PT);BSC/3in1;Other (comment) (elevating leg rests, removeable arm rest; drop-arm BSC; Sliding Board; Ramp)  Recommendations for Other Services       Functional Status Assessment Patient has had a recent  decline in their functional status and demonstrates the ability to make significant improvements in function in a reasonable and predictable amount of time.     Precautions / Restrictions Precautions Precautions: Fall Recall of Precautions/Restrictions: Intact Required Braces or Orthoses: Splint/Cast Splint/Cast: Short leg spint LLE Splint/Cast - Date Prophylactic Dressing Applied (if applicable): 10/14/24 Restrictions Weight Bearing Restrictions Per Provider Order: Yes RLE Weight Bearing Per Provider Order: Non weight bearing LLE Weight Bearing Per Provider Order: Non weight bearing      Mobility  Bed Mobility Overal bed mobility: Needs Assistance Bed Mobility: Supine to Sit     Supine to sit: Min assist, Used rails     General bed mobility comments: Pt pulled himself into long sitting from a flat bed by using bilateral bed rails. He sat up on L side of bed with increased time. Assist to bring each LE over to the EOB slowly. Pt scooted fwd using BUE support on bed/rails.    Transfers Overall transfer level: Needs assistance Equipment used: Sliding board Transfers: Bed to chair/wheelchair/BSC            Lateral/Scoot Transfers: Contact guard assist, With slide board General transfer comment: Educated pt on head-hips relationship. Cued sequencing of lift, shift, lower technique. He slowly transferred to recliner chair on his left using sliding board. Pt was able to maintain BLE NWB and utilized BUE support on bed/sliding board to clear hips and slowly advance to chair. Pt lean R/L in order to have the sliding board placed/removed.    Ambulation/Gait               General Gait Details: Contraindicated, pt is NWB on BLE.  Stairs  Wheelchair Mobility     Tilt Bed    Modified Rankin (Stroke Patients Only)       Balance Overall balance assessment: Needs assistance Sitting-balance support: Bilateral upper extremity supported, Feet  supported Sitting balance-Leahy Scale: Good Sitting balance - Comments: Pt sat EOB statically with supervision and dynamically transferred with CGA                                     Pertinent Vitals/Pain Pain Assessment Pain Assessment: Faces Faces Pain Scale: Hurts little more Pain Location: BLE when in a dependent position Pain Descriptors / Indicators: Discomfort, Aching, Sore, Throbbing Pain Intervention(s): Monitored during session, Limited activity within patient's tolerance, Repositioned    Home Living Family/patient expects to be discharged to:: Private residence Living Arrangements: Spouse/significant other Available Help at Discharge: Family;Available 24 hours/day Type of Home: House Home Access: Stairs to enter Entrance Stairs-Rails: Doctor, general practice of Steps: 3   Home Layout: Two level;Able to live on main level with bedroom/bathroom;Full bath on main level (Bonus room, doesn't have to go upstairs) Home Equipment: Grab bars - tub/shower;Hand held shower head;Antonio - single point;BSC/3in1;Rolling Walker (2 wheels)      Prior Function Prior Level of Function : Independent/Modified Independent;Driving;Working/employed             Mobility Comments: Ambulates without AD. 1 fall leading to current admission, off ladder. ADLs Comments: Indep with ADLs/IADLs. Drives. Works full-time as a Careers adviser, goes to job sites and measures.     Extremity/Trunk Assessment   Upper Extremity Assessment Upper Extremity Assessment: Defer to OT evaluation    Lower Extremity Assessment Lower Extremity Assessment: RLE deficits/detail;LLE deficits/detail RLE Deficits / Details: Pt POD 1 s/p femur ORIF. Decreased hip and knee AROM. Grossly 3-/5 strength. RLE Sensation: WNL RLE Coordination: WNL LLE Deficits / Details: Pt is in a short leg splint spanning the ankle joint. Hip and knee AROM WFL. Grossly 3/5 strength. LLE: Unable  to fully assess due to immobilization LLE Sensation: WNL LLE Coordination: WNL    Cervical / Trunk Assessment Cervical / Trunk Assessment: Normal  Communication   Communication Communication: No apparent difficulties    Cognition Arousal: Alert Behavior During Therapy: WFL for tasks assessed/performed   PT - Cognitive impairments: No apparent impairments                       PT - Cognition Comments: Pt A,Ox4 Following commands: Intact       Cueing Cueing Techniques: Verbal cues, Gestural cues     General Comments      Exercises     Assessment/Plan    PT Assessment Patient needs continued PT services  PT Problem List Decreased range of motion;Decreased strength;Decreased activity tolerance;Decreased balance;Decreased mobility;Pain       PT Treatment Interventions DME instruction;Functional mobility training;Therapeutic activities;Therapeutic exercise;Balance training;Wheelchair mobility training;Patient/family education    PT Goals (Current goals can be found in the Care Plan section)  Acute Rehab PT Goals Patient Stated Goal: Return Home and regain independence PT Goal Formulation: With patient/family Time For Goal Achievement: 10/29/24 Potential to Achieve Goals: Good Additional Goals Additional Goal #1: Pt will propel manual w/c ~129ft with modI.    Frequency Min 2X/week     Co-evaluation               AM-PAC PT 6 Clicks Mobility  Outcome Measure Help needed  turning from your back to your side while in a flat bed without using bedrails?: A Little Help needed moving from lying on your back to sitting on the side of a flat bed without using bedrails?: A Little Help needed moving to and from a bed to a chair (including a wheelchair)?: A Little Help needed standing up from a chair using your arms (e.g., wheelchair or bedside chair)?: Total Help needed to walk in hospital room?: Total Help needed climbing 3-5 steps with a railing? :  Total 6 Click Score: 12    End of Session Equipment Utilized During Treatment: Gait belt Activity Tolerance: Patient tolerated treatment well Patient left: in chair;with call bell/phone within reach;with family/visitor present Nurse Communication: Mobility status PT Visit Diagnosis: Pain;Other abnormalities of gait and mobility (R26.89);Unsteadiness on feet (R26.81) Pain - Right/Left: Right Pain - part of body: Hip;Leg    Time: 9179-9150 PT Time Calculation (min) (ACUTE ONLY): 29 min   Charges:   PT Evaluation $PT Eval Moderate Complexity: 1 Mod   PT General Charges $$ ACUTE PT VISIT: 1 Visit         Randall SAUNDERS, PT, DPT Acute Rehabilitation Services Office: [phone] Secure Chat Preferred  Antonio Patterson 10/15/2024, 11:12 AM

## 2024-10-15 NOTE — Discharge Instructions (Addendum)
 Orthopedic Surgery Discharge Instructions  Patient name: Antonio Patterson Fracture: right distal femur fracture, right tibial plateau fracture, left calcaneus fracture Procedure Performed: right distal femur fracture open reduction internal fixation Date of Surgery: 10/14/2024 Surgeon: Ozell Ada, MD  Activity: You should not put any weight on either lower extremity. Weight bearing can cause displacement of you calcaneus fracture. The plate and screws are not designed to put weight through them and they can break or your fracture can split apart further with weight bearing.   Incision Care: Your incision sites have dressings over them. That dressings should remain in place and dry at all times for a total of one week after surgery. After one week, you can remove the dressings. Underneath the dressings, you will find skin staples. You should leave these staples in place. They will be taken out in the office when the wound has healed. Do not pick, rub, or scrub at them. Do not put cream or lotion over the surgical area. After one week and once the dressing is off, it is okay to let soap and water run over your incision. Again, do not pick, scrub, or rub at the staples when bathing. Do not submerge (e.g., take a bath, swim, go in a hot tub, etc.) until six weeks after surgery. There may be some bloody drainage from the incision into the dressing after surgery. This is normal. You do not need to replace the dressing. Continue to leave it in place for the one week as instructed above. Should the dressing become saturated with blood or drainage, please call the office for further instructions.   Medications: You have been prescribed Norco. This is a narcotic pain medication and should only be taken as prescribed. You should not drink alcohol or operate heavy machinery (including driving) while taking this medication. The Norco can cause constipation as a side effect. For that reason, you have been prescribed  senna and miralax. These are both laxatives. You do not need to take this medication if you develop diarrhea. Should you remain constipated even while taking the senna and miralax, please use the miralax twice daily.  You have been prescribed Xarelto as a blood thinner. This medication is to be taken to prevent blood clots. Take 10 milligrams every daily. You should refrain from using other blood thinners (warfarin, apixaban, plavix, xarelto, etc.) while using the Xarelto. You will need to take this medication for a total of 6 weeks after your surgery.   You should not use over-the-counter NSAIDs (ibuprofen, Aleve, Celebrex, naproxen, meloxicam, etc.) as there is evidence that these medications decrease fracture healing potential.   In order to set expectations for opioid prescriptions, you will only be prescribed opioids for a total of six weeks after surgery and, at two-weeks after surgery, your opioid prescription will start to tapered (decreased dosage and number of pills). If you have ongoing need for opioid medication six weeks after surgery, you will be referred to pain management. If you are already established with a provider that is giving you opioid medications, you should schedule an appointment with them for six weeks after surgery if you feel you are going to need another prescription. State law only allows for opioid prescriptions one week at a time. If you are running out of opioid medication near the end of the week, please call the office during business hours before running out so I can send you another prescription.   Driving: You should not drive while taking narcotic pain  medications. You should start getting back to driving slowly and you may want to try driving in a parking lot before doing anything more.   Diet: You are safe to resume your regular diet after surgery.   Reasons to Call the Office After Surgery: You should feel free to call the office with any concerns or questions  you have in the post-operative period, but you should definitely notify the office if you develop: -shortness of breath, chest pain, or trouble breathing -excessive bleeding, drainage, redness, or swelling around the surgical site -fevers, chills, or pain that is getting worse with each passing day -persistent nausea or vomiting -new weakness in any extremity, new or worsening numbness or tingling in any extremity -numbness in the groin, bowel or bladder incontinence -other concerns about your surgery  Follow Up Appointments: You have a follow up visit scheduled with Dr. Georgina on 11/05/2024 at 9:15am. The office location and phone number are listed below.   Office Information:  -Ozell Georgina, MD -Phone number: [phone] -Address: [address]       [place], [place] [address]   ===========================================  Information on my medicine - XARELTO (Rivaroxaban)  This medication education was reviewed with me or my healthcare representative as part of my discharge preparation.    Why was Xarelto prescribed for you? Xarelto was prescribed for you to reduce the risk of blood clots forming after orthopedic surgery. The medical term for these abnormal blood clots is venous thromboembolism (VTE).  What do you need to know about xarelto ? Take your Xarelto ONCE DAILY at the same time every day. You may take it either with or without food.  If you have difficulty swallowing the tablet whole, you may crush it and mix in applesauce just prior to taking your dose.  Take Xarelto exactly as prescribed by your doctor and DO NOT stop taking Xarelto without talking to the doctor who prescribed the medication.  Stopping without other VTE prevention medication to take the place of Xarelto may increase your risk of developing a clot.  After discharge, you should have regular check-up appointments with your healthcare provider that is prescribing your Xarelto.    What do you  do if you miss a dose? If you miss a dose, take it as soon as you remember on the same day then continue your regularly scheduled once daily regimen the next day. Do not take two doses of Xarelto on the same day.   Important Safety Information A possible side effect of Xarelto is bleeding. You should call your healthcare provider right away if you experience any of the following: Bleeding from an injury or your nose that does not stop. Unusual colored urine (red or dark brown) or unusual colored stools (red or black). Unusual bruising for unknown reasons. A serious fall or if you hit your head (even if there is no bleeding).  Some medicines may interact with Xarelto and might increase your risk of bleeding while on Xarelto. To help avoid this, consult your healthcare provider or pharmacist prior to using any new prescription or non-prescription medications, including herbals, vitamins, non-steroidal anti-inflammatory drugs (NSAIDs) and supplements.  This website has more information on Xarelto: VisitDestination.com.br.

## 2024-10-15 NOTE — Progress Notes (Signed)
 Orthopedic Surgery Progress Note   Assessment: Patient is a 73 y.o. male with right distal femur fracture status post ORIF  Right tibial plateau fracture (non-op) Left calcaneus fracture (non-op)   Plan: -Operative plans: complete -Diet: diabetic -DVT ppx: xarelto  -Antibiotics: ancef  x2 post-op doses -Weight bearing status: NWB BLE -PT evaluate and treat -Pain control -Dispo: remain floor status  ___________________________________________________________________________  Subjective: No acute events overnight. Pain well controlled. His only complaint this morning is he wants to control his insulin  management since he normally controls it at home and feels he has a good understanding of his diabetes.    Physical Exam:  General: no acute distress, appears stated age Neurologic: alert, answering questions appropriately, following commands Respiratory: unlabored breathing on room air, symmetric chest rise Psychiatric: appropriate affect, normal cadence to speech  MSK:    -Right lower extremity  Dressings over leg c/d/i EHL/TA/GSC intact Plantarflexes and dorsiflexes toes Sensation intact to light touch in sural, saphenous, tibial, deep peroneal, and superficial peroneal nerve distributions Foot warm and well perfused, dopplerable DP and PT pulses, palpable PT pulse  -Left lower extremity  Splint in place Fires hip flexors, quadriceps, hamstrings, tibialis anterior, gastrocnemius and soleus, extensor hallucis longus Plantarflexes and dorsiflexes toes Sensation intact to light touch in sural, saphenous, tibial, deep peroneal, and superficial peroneal nerve distributions Foot warm and well perfused   Yesterday's total administered Morphine  Milligram Equivalents: 60   Patient name: Antonio Patterson Patient MRN: [id number] Date: 10/15/24

## 2024-10-16 ENCOUNTER — Encounter: Payer: Self-pay | Admitting: Cardiovascular Disease

## 2024-10-16 ENCOUNTER — Other Ambulatory Visit (HOSPITAL_COMMUNITY): Payer: Self-pay

## 2024-10-16 LAB — GLUCOSE, CAPILLARY
Glucose-Capillary: 159 mg/dL — ABNORMAL HIGH (ref 70–99)
Glucose-Capillary: 179 mg/dL — ABNORMAL HIGH (ref 70–99)
Glucose-Capillary: 192 mg/dL — ABNORMAL HIGH (ref 70–99)
Glucose-Capillary: 218 mg/dL — ABNORMAL HIGH (ref 70–99)

## 2024-10-16 MED ORDER — RIVAROXABAN 10 MG PO TABS
10.0000 mg | ORAL_TABLET | Freq: Every day | ORAL | 0 refills | Status: DC
  Filled 2024-10-16: qty 30, 30d supply, fill #0

## 2024-10-16 MED ORDER — HYDROCODONE-ACETAMINOPHEN 5-325 MG PO TABS
1.0000 | ORAL_TABLET | ORAL | 0 refills | Status: DC | PRN
  Filled 2024-10-16: qty 20, 4d supply, fill #0

## 2024-10-16 MED ORDER — METHOCARBAMOL 500 MG PO TABS
500.0000 mg | ORAL_TABLET | Freq: Four times a day (QID) | ORAL | 0 refills | Status: DC
  Filled 2024-10-16: qty 40, 10d supply, fill #0

## 2024-10-16 MED ORDER — POLYETHYLENE GLYCOL 3350 17 GM/SCOOP PO POWD
17.0000 g | Freq: Every day | ORAL | 0 refills | Status: DC
  Filled 2024-10-16: qty 238, 14d supply, fill #0

## 2024-10-16 MED ORDER — SENNOSIDES-DOCUSATE SODIUM 8.6-50 MG PO TABS
1.0000 | ORAL_TABLET | Freq: Two times a day (BID) | ORAL | 0 refills | Status: DC
  Filled 2024-10-16: qty 28, 14d supply, fill #0

## 2024-10-16 NOTE — Care Management Important Message (Signed)
 Important Message  Patient Details  Name: Antonio Patterson MRN: [id number] Date of Birth: August 17, 1951   Important Message Given:  Yes - Medicare IM     Jon Cruel 10/16/2024, 3:17 PM

## 2024-10-16 NOTE — Evaluation (Signed)
 Occupational Therapy Evaluation Patient Details Name: Antonio Patterson MRN: [id number] DOB: 05/12/1951 Today's Date: 10/16/2024   History of Present Illness   Pt is a 73 y.o. male admitted 10/13/24 after fall off ladder sustaining R distal femur fracture with intraarticular extension, R tibial plateau fracture, and L calcaneus fracture. Pt s/p R femur ORIF 10/21. Other acute injuries will be managed non-operatively. PMHx: CAD s/p CABG, DM, HLD, and HTN.     Clinical Impressions This 73 yo male admitted and underwent above presents to acute OT with PLOF of being totally independent with basic ADLs, IADLs, driving, and working. Currently he is independent-total A for basic ADLs due to NWB'ing status Bil LEs and decreased AROM Bil LEs. He will continue to benefit from acute OT without need for follow up.     If plan is discharge home, recommend the following:   A little help with walking and/or transfers;A lot of help with bathing/dressing/bathroom;Assist for transportation;Help with stairs or ramp for entrance     Functional Status Assessment   Patient has had a recent decline in their functional status and demonstrates the ability to make significant improvements in function in a reasonable and predictable amount of time.     Equipment Recommendations    (drop arm BSC)      Precautions/Restrictions   Precautions Precautions: Fall Recall of Precautions/Restrictions: Intact Required Braces or Orthoses: Splint/Cast Splint/Cast: Short leg spint LLE Splint/Cast - Date Prophylactic Dressing Applied (if applicable): 10/14/24 Restrictions Weight Bearing Restrictions Per Provider Order: Yes RLE Weight Bearing Per Provider Order: Non weight bearing LLE Weight Bearing Per Provider Order: Non weight bearing     Mobility Bed Mobility Overal bed mobility: Needs Assistance Bed Mobility: Supine to Sit     Supine to sit: Min assist, Used rails     General bed mobility  comments: Had pt come up into long sitting and then try to pivot around on his bum to get in an position where he could do a posterior transfer into wheelchair--mod A for trunk support and Mod A for RLE to move it around in bed    Transfers Overall transfer level: Needs assistance Equipment used: Sliding board              Lateral/Scoot Transfers: Min assist, With slide board (level surfaces) General transfer comment: Pt recalled the head-hips relationship, needed A to place board, he maintained Bil LE NWB, wife removed board for him      Balance Overall balance assessment: Needs assistance Sitting-balance support: No upper extremity supported, Feet supported Sitting balance-Leahy Scale: Good                                     ADL either performed or assessed with clinical judgement   ADL Overall ADL's : Needs assistance/impaired Eating/Feeding: Independent;Sitting   Grooming: Set up;Sitting   Upper Body Bathing: Set up;Sitting   Lower Body Bathing: Moderate assistance;Bed level   Upper Body Dressing : Set up;Sitting   Lower Body Dressing: Total assistance;Sitting/lateral leans   Toilet Transfer: Minimal Arboriculturist Details (indicate cue type and reason): simulated from bed to wheelchair Toileting- Clothing Manipulation and Hygiene: Moderate assistance Toileting - Clothing Manipulation Details (indicate cue type and reason): should be able to do hygiene lateral lean, but will need A for underwear       General ADL Comments: Dicussed with pt, wife, and dtr about making  dressing simple (ie: t-shirt and undershorts with a blanket across lap as needed. As educated them on pt lifting up at trunk using arms so family can A with getting underwear up and down and to do lateral scoots with underwear on     Vision Patient Visual Report: No change from baseline              Pertinent Vitals/Pain Pain Assessment Pain  Assessment: 0-10 Faces Pain Scale: Hurts whole lot Pain Location: RLE when back of upper leg was resting on bottom bed rail while pt sitting EOB; other than this just reports legs are sore Pain Descriptors / Indicators: Grimacing, Guarding, Sharp, Aching, Sore Pain Intervention(s): Limited activity within patient's tolerance, Monitored during session, Premedicated before session, Repositioned     Extremity/Trunk Assessment Upper Extremity Assessment Upper Extremity Assessment: Overall WFL for tasks assessed (but needs to strengthen and maintain strength for all his mobility and transfers due to NWBing Bil LEs)           Communication Communication Communication: No apparent difficulties   Cognition Arousal: Alert Behavior During Therapy: WFL for tasks assessed/performed Cognition: No apparent impairments                               Following commands: Intact       Cueing   Cueing Techniques: Verbal cues              Home Living Family/patient expects to be discharged to:: Private residence Living Arrangements: Spouse/significant other Available Help at Discharge: Family;Available 24 hours/day Type of Home: House Home Access: Stairs to enter [employer] of Steps: 3 Entrance Stairs-Rails: Right;Left Home Layout: Two level;Able to live on main level with bedroom/bathroom;Full bath on main level     Bathroom Shower/Tub: Walk-in shower;Tub/shower unit         Home Equipment: Grab bars - tub/shower;Hand held shower head;Cane - single point;BSC/3in1;Rolling Walker (2 wheels)          Prior Functioning/Environment Prior Level of Function : Independent/Modified Independent;Driving;Working/employed             Mobility Comments: Ambulates without AD. 1 fall leading to current admission, off ladder. ADLs Comments: Indep with ADLs/IADLs. Drives. Works full-time as a Careers adviser, goes to job sites and measures.     OT Problem List: Decreased strength;Decreased range of motion;Impaired balance (sitting and/or standing);Pain   OT Treatment/Interventions: Self-care/ADL training;DME and/or AE instruction;Balance training;Patient/family education      OT Goals(Current goals can be found in the care plan section)   Acute Rehab OT Goals Patient Stated Goal: be in less pain and be able to to home OT Goal Formulation: With patient/family Time For Goal Achievement: 10/30/24 Potential to Achieve Goals: Good   OT Frequency:  Min 2X/week       AM-PAC OT 6 Clicks Daily Activity     Outcome Measure Help from another person eating meals?: None Help from another person taking care of personal grooming?: A Little Help from another person toileting, which includes using toliet, bedpan, or urinal?: A Lot Help from another person bathing (including washing, rinsing, drying)?: A Lot Help from another person to put on and taking off regular upper body clothing?: A Little Help from another person to put on and taking off regular lower body clothing?: Total 6 Click Score: 15   End of Session Equipment Utilized During Treatment:  (transfer board)  Activity Tolerance: Patient  tolerated treatment well Patient left:  (in W/C)  OT Visit Diagnosis: Other abnormalities of gait and mobility (R26.89);Muscle weakness (generalized) (M62.81);Pain Pain - Right/Left:  (R >L  Le)                Time: 9078-8990 OT Time Calculation (min): 48 min Charges:  OT General Charges $OT Visit: 1 Visit OT Evaluation $OT Eval Moderate Complexity: 1 Mod OT Treatments $Self Care/Home Management : 23-37 mins  Donny BECKER OT Acute Rehabilitation Services Office [phone]    [place] Dorothyann Distel 10/16/2024, 11:23 AM

## 2024-10-16 NOTE — Inpatient Diabetes Management (Signed)
 Inpatient Diabetes Program Recommendations  AACE/ADA: New Consensus Statement on Inpatient Glycemic Control (2015)  Target Ranges:  Prepandial:   less than 140 mg/dL      Peak postprandial:   less than 180 mg/dL (1-2 hours)      Critically ill patients:  140 - 180 mg/dL   Lab Results  Component Value Date   GLUCAP 179 (H) 10/16/2024   HGBA1C 6.4 (H) 10/13/2024    Review of Glycemic Control  Latest Reference Range & Units 10/15/24 11:47 10/15/24 18:07 10/15/24 21:23 10/16/24 06:06 10/16/24 11:29  Glucose-Capillary 70 - 99 mg/dL 722 (H) 841 (H) 758 (H) 159 (H) 179 (H)   Diabetes history: DM  Outpatient Diabetes medications:  Novolog  5-40 units tid with meals  Current orders for Inpatient glycemic control:  Novolog  0-15 units tid and HS  Inpatient Diabetes Program Recommendations:    May consider adding Semglee 10 units daily while in the hospital.  Thanks,  Randall Bullocks, RN, BC-ADM Inpatient Diabetes Coordinator Pager [phone]  (8a-5p)

## 2024-10-16 NOTE — Progress Notes (Signed)
 Physical Therapy Treatment Patient Details Name: Antonio Patterson MRN: [id number] DOB: [birth year] Today's Date: 10/16/2024   History of Present Illness Pt is a 73 y.o. male admitted 10/13/24 after fall off ladder sustaining R distal femur fracture with intraarticular extension, R tibial plateau fracture, and L calcaneus fracture. Pt s/p R femur ORIF 10/21. Other acute injuries will be managed non-operatively. PMHx: CAD s/p CABG, DM, HLD, and HTN.    PT Comments  Pt greeted seated in manual w/c, pleasant and agreeable to PT. Today's session focused on transfer training and w/c education. Reviewed the various parts and features of the manual w/c with pt and family. Demonstrated how to correctly utilize and/or remove these components. Pt declined w/c propulsion reporting he mobilized within hallway using BUE and intermittent support from family. Pt demonstrated good carryover of head-hips relationship and lift, shift, lower technique. He completed two incline and one decline lateral scoot transfer using sliding board with CGA. Pt reported preference of going to the left as it was easier d/t RLE pain. Will continue to follow acutely and advance appropriately.      If plan is discharge home, recommend the following: A little help with walking and/or transfers;A little help with bathing/dressing/bathroom;Assistance with cooking/housework;Assist for transportation;Help with stairs or ramp for entrance   Can travel by private Theme park manager cushion (measurements PT);Wheelchair (measurements PT);BSC/3in1;Other (comment) (elevating leg rests, removeable arm rest; drop-arm BSC; Sliding Board)    Recommendations for Other Services       Precautions / Restrictions Precautions Precautions: Fall Recall of Precautions/Restrictions: Intact Required Braces or Orthoses: Splint/Cast Splint/Cast: Short leg spint LLE Splint/Cast - Date Prophylactic Dressing Applied (if  applicable): 10/14/24 Restrictions Weight Bearing Restrictions Per Provider Order: Yes RLE Weight Bearing Per Provider Order: Non weight bearing LLE Weight Bearing Per Provider Order: Non weight bearing     Mobility  Bed Mobility Overal bed mobility: Needs Assistance Bed Mobility: Supine to Sit, Sit to Supine     Supine to sit: Min assist, Used rails Sit to supine: Min assist   General bed mobility comments: Pt took increasd time. Assist to manage BLE (R>L) in/out of bed.    Transfers Overall transfer level: Needs assistance Equipment used: Sliding board Transfers: Bed to chair/wheelchair/BSC            Lateral/Scoot Transfers: Contact guard assist, With slide board General transfer comment: Reviewed head-hips relationships with pt recalling. Cued pt to increase fwd and side lean to aid in scooting. He demonstrated the lift, shift, lower technique with BUE support. Pt had an easier time with support of arm rest or bed rail compare to just pushing down on sliding board or bed. He utilized a fist to get some increased clearance. He was able to place and remove sliding board himself. Assist to manage w/c features including removing arm rests and leg rests. Pt locked breaks himself. Pt completed two incline transfers back to bed to simulate getting into his bed at home.  CGA at trunk for stability and to ensure pt maintain BLE NWB at all times. He intermittently resting    foot on floor and possibly loaded foot. He completed one decline transfer with ease. Pt reports preference to transfer to the left. Discussed how he should position w/c at home and encouraged him to practice transfers going to the right more as you can't guarantee one direction all the time. Pt declined further transfer training d/t increasing pain and  fatigue.    Ambulation/Gait               General Gait Details: Contraindicated, pt is NWB on BLE.   Stairs             Investment banker, operational mobility: Yes Wheelchair propulsion: Both upper extremities Wheelchair parts: Biomedical scientist Details (indicate cue type and reason): Pt declined further w/c propulsion tranining reporting he mobilized himself earlier with family assistance in hallway after OT session and feels confident navigating the w/c. Educated pt/family on w/c parts and features demonstrating how to use and remove the various components.   Tilt Bed    Modified Rankin (Stroke Patients Only)       Balance Overall balance assessment: Needs assistance Sitting-balance support: No upper extremity supported Sitting balance-Leahy Scale: Good                                      Communication Communication Communication: No apparent difficulties  Cognition Arousal: Alert Behavior During Therapy: WFL for tasks assessed/performed   PT - Cognitive impairments: No apparent impairments                         Following commands: Intact      Cueing Cueing Techniques: Verbal cues, Gestural cues  Exercises      General Comments        Pertinent Vitals/Pain Pain Assessment Pain Assessment: 0-10 Pain Score: 6  Pain Location: RLE when back of upper leg is resting against bed rail or w/c Pain Descriptors / Indicators: Grimacing, Guarding, Sharp, Aching, Sore Pain Intervention(s): Monitored during session, Limited activity within patient's tolerance, Repositioned, Patient requesting pain meds-RN notified    Home Living Family/patient expects to be discharged to:: Private residence Living Arrangements: Spouse/significant other Available Help at Discharge: Family;Available 24 hours/day Type of Home: House Home Access: Stairs to enter Entrance Stairs-Rails: Doctor, general practice of Steps: 3   Home Layout: Two level;Able to live on main level with bedroom/bathroom;Full bath on main level Home Equipment: Grab bars - tub/shower;Hand  held shower head;Cane - single point;BSC/3in1;Rolling Walker (2 wheels)      Prior Function            PT Goals (current goals can now be found in the care plan section) Acute Rehab PT Goals Patient Stated Goal: Return Home and regain independence PT Goal Formulation: With patient/family Time For Goal Achievement: 10/29/24 Potential to Achieve Goals: Good Progress towards PT goals: Progressing toward goals    Frequency    Min 2X/week      PT Plan      Co-evaluation              AM-PAC PT 6 Clicks Mobility   Outcome Measure  Help needed turning from your back to your side while in a flat bed without using bedrails?: A Little Help needed moving from lying on your back to sitting on the side of a flat bed without using bedrails?: A Little Help needed moving to and from a bed to a chair (including a wheelchair)?: A Little Help needed standing up from a chair using your arms (e.g., wheelchair or bedside chair)?: Total Help needed to walk in hospital room?: Total Help needed climbing 3-5 steps with a railing? : Total 6 Click Score: 12    End of Session Equipment Utilized During  Treatment: Gait belt Activity Tolerance: Patient tolerated treatment well Patient left: in bed;with call bell/phone within reach;with bed alarm set;with family/visitor present Nurse Communication: Mobility status;Patient requests pain meds PT Visit Diagnosis: Pain;Other abnormalities of gait and mobility (R26.89);Unsteadiness on feet (R26.81) Pain - Right/Left: Right Pain - part of body: Hip;Leg     Time: 1202-1242 PT Time Calculation (min) (ACUTE ONLY): 40 min  Charges:    $Therapeutic Activity: 38-52 mins PT General Charges $$ ACUTE PT VISIT: 1 Visit                     Randall SAUNDERS, PT, DPT Acute Rehabilitation Services Office: [phone] Secure Chat Preferred  Delon CHRISTELLA Callander 10/16/2024, 2:23 PM

## 2024-10-16 NOTE — Progress Notes (Signed)
 Orthopedic Surgery Progress Note   Assessment: Patient is a 73 y.o. male with right distal femur fracture status post ORIF  Right tibial plateau fracture (non-op) Left calcaneus fracture (non-op)   Plan: -Operative plans: complete -Diet: diabetic -DVT ppx: xarelto  -Antibiotics: ancef  x2 post-op doses -Weight bearing status: NWB BLE -PT evaluate and treat -Pain control -Anticipate discharge to home Friday or Saturday  ___________________________________________________________________________  Subjective: No acute events overnight. Slight increase in pain at the right distal thigh when compared to yesterday. Pain still well controlled with Norco. Patient was able to transfer into wheelchair yesterday with PT. He is making arrangements to have his house be wheelchair accessible.    Physical Exam:  General: no acute distress, appears stated age Neurologic: alert, answering questions appropriately, following commands Respiratory: unlabored breathing on room air, symmetric chest rise Psychiatric: appropriate affect, normal cadence to speech  MSK:    -Right lower extremity  Dressings over leg c/d/i EHL/TA/GSC intact Plantarflexes and dorsiflexes toes Sensation intact to light touch in sural, saphenous, tibial, deep peroneal, and superficial peroneal nerve distributions Foot warm and well perfused, dopplerable DP and PT pulses, palpable PT pulse  -Left lower extremity  Splint in place Fires hip flexors, quadriceps, hamstrings, tibialis anterior, gastrocnemius and soleus, extensor hallucis longus Plantarflexes and dorsiflexes toes Sensation intact to light touch in sural, saphenous, tibial, deep peroneal, and superficial peroneal nerve distributions Foot warm and well perfused   Yesterday's total administered Morphine  Milligram Equivalents: 5   Patient name: Antonio Patterson Patient MRN: [id number] Date: 10/16/24

## 2024-10-17 ENCOUNTER — Other Ambulatory Visit (HOSPITAL_COMMUNITY): Payer: Self-pay

## 2024-10-17 LAB — GLUCOSE, CAPILLARY
Glucose-Capillary: 189 mg/dL — ABNORMAL HIGH (ref 70–99)
Glucose-Capillary: 206 mg/dL — ABNORMAL HIGH (ref 70–99)
Glucose-Capillary: 225 mg/dL — ABNORMAL HIGH (ref 70–99)
Glucose-Capillary: 193 mg/dL — ABNORMAL HIGH (ref 70–99)

## 2024-10-17 LAB — CBC
HCT: 31.7 % — ABNORMAL LOW (ref 39.0–52.0)
Hemoglobin: 10.5 g/dL — ABNORMAL LOW (ref 13.0–17.0)
MCH: 29.6 pg (ref 26.0–34.0)
MCHC: 33.1 g/dL (ref 30.0–36.0)
MCV: 89.3 fL (ref 80.0–100.0)
Platelets: 281 K/uL (ref 150–400)
RBC: 3.55 MIL/uL — ABNORMAL LOW (ref 4.22–5.81)
RDW: 14.7 % (ref 11.5–15.5)
WBC: 14.4 K/uL — ABNORMAL HIGH (ref 4.0–10.5)
nRBC: 0 % (ref 0.0–0.2)

## 2024-10-17 MED ORDER — INSULIN GLARGINE-YFGN 100 UNIT/ML ~~LOC~~ SOLN
10.0000 [IU] | Freq: Every day | SUBCUTANEOUS | Status: DC
  Administered 2024-10-17: 10 [IU] via SUBCUTANEOUS
  Filled 2024-10-17 (×2): qty 0.1

## 2024-10-17 NOTE — Progress Notes (Signed)
 Occupational Therapy Treatment Patient Details Name: Antonio Patterson MRN: [id number] DOB: 1951-03-22 Today's Date: 10/17/2024   History of present illness Pt is a 73 y.o. male admitted 10/13/24 after fall off ladder sustaining R distal femur fracture with intraarticular extension, R tibial plateau fracture, and L calcaneus fracture. Pt s/p R femur ORIF 10/21. Other acute injuries will be managed non-operatively. PMHx: CAD s/p CABG, DM, HLD, and HTN.   OT comments  Pt seen a second time with intent to just give him new theraband handout. Upon entering dtr wanted to discuss if I felt like pt was ready to go home. I turned and asked the patient how was I to answer this question since I had already told him my recommendations. So further discussion with pt and extended family about how is currently doing and what level he would more than likely be at if he went to AIR and pt now agreeable to AIR consult. This should allow him to not have to go home with a hoyer lift and will make him more independent with transfers and ADLs.      If plan is discharge home, recommend the following:  Two people to help with walking and/or transfers;A lot of help with bathing/dressing/bathroom;Assistance with cooking/housework;Help with stairs or ramp for entrance;Assist for transportation   Equipment Recommendations   (drop arm BSC, hoyer lift)       Precautions / Restrictions Precautions Precautions: Fall Recall of Precautions/Restrictions: Intact Required Braces or Orthoses: Splint/Cast Splint/Cast: Short leg spint LLE Splint/Cast - Date Prophylactic Dressing Applied (if applicable): 10/17/24 (new one applied by ortho tech due to current one got wet with dropped urinal) Restrictions Weight Bearing Restrictions Per Provider Order: Yes RLE Weight Bearing Per Provider Order: Non weight bearing LLE Weight Bearing Per Provider Order: Non weight bearing       Mobility Bed Mobility Overal bed mobility: Needs  Assistance Bed Mobility: Supine to Sit     Supine to sit: Min assist, Used rails, HOB elevated Sit to supine: Mod assist (for legs)        Transfers Overall transfer level: Needs assistance Equipment used: Sliding board Transfers: Bed to chair/wheelchair/BSC [facility])            Lateral/Scoot Transfers: With slide board General transfer comment: Pt needed VCs to use head-hips relationship today. He required min A for lateral scoot to the left and once on drop arm BSC his RLE had to be propped up on trashcan with pillows on it so that the front bar of the [facility] was not hurting his upper leg.Lateral scoot back to right was max A +2 (with someone supporting his RLE the whole way due to pain from the bar of the [facility] and then more A to scoot across the board due to no underwear on (it was soiled)).     Balance Overall balance assessment: Needs assistance Sitting-balance support: No upper extremity supported, Feet supported Sitting balance-Leahy Scale: Good                                     ADL either performed or assessed with clinical judgement   ADL Overall ADL's : Needs assistance/impaired                           Toilet Transfer Details (indicate cue type and reason): Drop arm BSC with transfer board; pt Min  A to use board to scoot to left with underwear on, total A to get underwear down with pt alternatingly lifting legs, pt max A +2 to scoot back with pillow case on transfer board due to no underwear on (no extra ones to put on)--A to scoot and for RLE.   Toileting - Clothing Manipulation Details (indicate cue type and reason): total A for back peri care       General ADL Comments: Back in to give pt theraband handout that does not require anchoring with one of his feet and dtr was there. Entered into another discussion with pt, dtr, wife and dtr (as well as wife) are really concerned about pt going home with the assist he currently needs. +2 A for some  transfers, A for RLE, A for ADLs, and for getting into a car. Pt now agreeable to AIR consult.    Extremity/Trunk Assessment Upper Extremity Assessment Upper Extremity Assessment: Generalized weakness;Overall [facility] for tasks assessed (but needs to strengthen and maintain strength for all his mobility and transfers due to NWBing Bil LEs)            Vision Patient Visual Report: No change from baseline           Communication Communication Communication: No apparent difficulties   Cognition Arousal: Alert Behavior During Therapy: WFL for tasks assessed/performed Cognition: No apparent impairments                               Following commands: Intact        Cueing   Cueing Techniques: Verbal cues, Gestural cues  Exercises Other Exercises Other Exercises: Pt given [employer] handout with theraband exercises as well as link he can use to access them as well. Provided level 3 (green theraband). Did not get around going over these with him today due to increased time with toileting transfer and multiple discussions with pt and wife over various issues (as noted in this progress note)            Pertinent Vitals/ Pain       Pain Assessment Pain Assessment: 0-10 Faces Pain Scale: Hurts even more Pain Location: RLE when on hard surface (ie: bed rail when sitting EOB, front rail of drop arm toilet) Pain Descriptors / Indicators: Grimacing, Guarding, Aching, Sore, Stabbing Pain Intervention(s): Limited activity within patient's tolerance, Monitored during session, Repositioned, Premedicated before session         Frequency  Min 2X/week        Progress Toward Goals  OT Goals(current goals can now be found in the care plan section)  Progress towards OT goals: Progressing toward goals (slowly)  Acute Rehab OT Goals Patient Stated Goal: to go home on AIR OT Goal Formulation: With patient Time For Goal Achievement: 10/30/24 Potential to Achieve Goals: Good             End of Session Equipment Utilized During Treatment: Gait belt  OT Visit Diagnosis: Other abnormalities of gait and mobility (R26.89);Muscle weakness (generalized) (M62.81);Pain Pain - Right/Left:  (both) Pain - part of body: Leg (R>L)   Activity Tolerance Patient tolerated treatment well   Patient Left in bed;with call bell/phone within reach (ortho techs redoing LLE splint)           Time: 8571-8561 OT Time Calculation (min): 10 min  Charges: OT General Charges $OT Visit: 1 Visit OT Treatments $Self Care/Home Management : 8-22 mins  Donny  L. OT Acute Rehabilitation Services Office [phone]    Rodgers Dorothyann Distel 10/17/2024, 2:43 PM

## 2024-10-17 NOTE — Progress Notes (Signed)
 Occupational Therapy Treatment Patient Details Name: Antonio Patterson MRN: [id number] DOB: 11-11-1951 Today's Date: 10/17/2024   History of present illness Pt is a 73 y.o. male admitted 10/13/24 after fall off ladder sustaining R distal femur fracture with intraarticular extension, R tibial plateau fracture, and L calcaneus fracture. Pt s/p R femur ORIF 10/21. Other acute injuries will be managed non-operatively. PMHx: CAD s/p CABG, DM, HLD, and HTN.   OT comments  This 73 yo seen today to practice drop arm BSC transfers with transfer board--min A to go from bed to drop arm BSC but Max A +2 from drop arm BSC to bed. (Difference was he needed more A for RLE and he did not have underwear on and could not get enough lift with his shorter arms to get across board on his own. Really feel like he should consider going to AIR but really wants to go home so hoyer lift added to home equipment recommendations as well as HHOT added. Pt will continue to benefit from acute OT.      If plan is discharge home, recommend the following:  Two people to help with walking and/or transfers;A lot of help with bathing/dressing/bathroom;Assistance with cooking/housework;Help with stairs or ramp for entrance;Assist for transportation   Equipment Recommendations   (drop arm BSC, hoyer lift)       Precautions / Restrictions Precautions Precautions: Fall Recall of Precautions/Restrictions: Intact Required Braces or Orthoses: Splint/Cast Splint/Cast: Short leg spint LLE Splint/Cast - Date Prophylactic Dressing Applied (if applicable): 10/17/24 (new one applied by ortho tech due to current one got wet with dropped urinal) Restrictions Weight Bearing Restrictions Per Provider Order: Yes RLE Weight Bearing Per Provider Order: Non weight bearing LLE Weight Bearing Per Provider Order: Non weight bearing       Mobility Bed Mobility Overal bed mobility: Needs Assistance Bed Mobility: Supine to Sit     Supine to  sit: Min assist, Used rails, HOB elevated Sit to supine: Mod assist (for legs)        Transfers Overall transfer level: Needs assistance Equipment used: Sliding board Transfers: Bed to chair/wheelchair/BSC [facility])            Lateral/Scoot Transfers: With slide board General transfer comment: Pt needed VCs to use head-hips relationship today. He required min A for lateral scoot to the left and once on drop arm BSC his RLE had to be propped up on trashcan with pillows on it so that the front bar of the [facility] was not hurting his upper leg.Lateral scoot back to right was max A +2 (with someone supporting his RLE the whole way due to pain from the bar of the [facility] and then more A to scoot across the board due to no underwear on (it was soiled)).     Balance Overall balance assessment: Needs assistance Sitting-balance support: No upper extremity supported, Feet supported Sitting balance-Leahy Scale: Good                                     ADL either performed or assessed with clinical judgement   ADL Overall ADL's : Needs assistance/impaired                           Toilet Transfer Details (indicate cue type and reason): Drop arm BSC with transfer board; pt Min A to use board to scoot to left with  underwear on, total A to get underwear down with pt alternatingly lifting legs, pt max A +2 to scoot back with pillow case on transfer board due to no underwear on (no extra ones to put on)--A to scoot and for RLE.   Toileting - Clothing Manipulation Details (indicate cue type and reason): total A for back peri care       General ADL Comments: Pt said he was good sitting on the toilet, so I left room to get a few things for him and wife was with him. When I returned she said we need the nurse it was twitching and jerking and not responding to me. I walked over to him, touched him on the shoulder, and asked how he was--he looked up immediately and responded (although he  did look a little pale), would close his eyes but open them back up readily. Called RN into room and she took BP which was low. Got pt back to bed with A of NT holding RLE. Had a long discussion with pt and wife that I really felt like AIR was a better choice than going home with [facility] and explained why, even contacted via secure chat admission coordinators for questions, but ultimately he still declined and wants to go home--so a hoyer lift was added to equipement recommendations.  We did discuss that hoyer lift legs have to fit under whatever he is sleeping in or getting to/from and that he may have to raise surfaces to for this to occur. It was also discussed that hoyer lift could be used to A him on and off the BSC--but will have to get pad out from under him for him to use the bathroom and put back under him to get off the [facility]. While pt was on drop arm BSC he dropped urinal and got left posterior splint wet--called ortho tech and they came to change it out. The plan tomorrow was that PT would see him for a car transfer going from wheelchair to back of minivan (floor); however due to not sure when DME will be delivered to house there was no guarantee that a PT would be available late tomorrow afternoon-- so pt ultimately decided on ambulance transport home and will work with HHPT on transfers in and out of the car. In regards to this we did talk through that mini fleeta may not be good option anyway due to yesterday pt could not do a posterior transfer from bed to recliner and this is what he would need to do to get in the back of a mini van on floor. Pt not sure he or family have an automobile suitable for patient to get in and out of--so I mentioned to them medical transport for appointments where he could go in his wheelchair and recommeded they look into this as an option.    Extremity/Trunk Assessment Upper Extremity Assessment Upper Extremity Assessment: Generalized weakness;Overall [facility] for tasks assessed  (but needs to strengthen and maintain strength for all his mobility and transfers due to NWBing Bil LEs)            Vision Patient Visual Report: No change from baseline           Communication Communication Communication: No apparent difficulties   Cognition Arousal: Alert Behavior During Therapy: WFL for tasks assessed/performed Cognition: No apparent impairments  Following commands: Intact        Cueing   Cueing Techniques: Verbal cues, Gestural cues  Exercises Other Exercises Other Exercises: Pt given [employer] handout with theraband exercises as well as link he can use to access them as well. Provided level 3 (green theraband). Did not get around going over these with him today due to increased time with toileting transfer and multiple discussions with pt and wife over various issues (as noted in this progress note)            Pertinent Vitals/ Pain       Pain Assessment Pain Assessment: 0-10 Faces Pain Scale: Hurts even more Pain Location: RLE when on hard surface (ie: bed rail when sitting EOB, front rail of drop arm toilet) Pain Descriptors / Indicators: Grimacing, Guarding, Aching, Sore, Stabbing Pain Intervention(s): Limited activity within patient's tolerance, Monitored during session, Repositioned, Premedicated before session         Frequency  Min 2X/week        Progress Toward Goals  OT Goals(current goals can now be found in the care plan section)  Progress towards OT goals: Progressing toward goals (slowly)  Acute Rehab OT Goals Patient Stated Goal: to go home on AIR OT Goal Formulation: With patient/family Time For Goal Achievement: 10/30/24 Potential to Achieve Goals: Good            End of Session Equipment Utilized During Treatment: Gait belt  OT Visit Diagnosis: Other abnormalities of gait and mobility (R26.89);Muscle weakness (generalized) (M62.81);Pain Pain - Right/Left:  (both) Pain  - part of body: Leg (R>L)   Activity Tolerance Patient tolerated treatment well   Patient Left in bed;with call bell/phone within reach (ortho techs redoing LLE splint)           Time: 8883-8688 OT Time Calculation (min): 115 min  Charges: OT Treatments $Self Care/Home Management : 113-127 mins  Antonio Patterson OT Acute Rehabilitation Services Office [phone]    Antonio Patterson 10/17/2024, 1:51 PM

## 2024-10-17 NOTE — TOC Initial Note (Addendum)
 Transition of Care [facility]) - Initial/Assessment Note    Patient Details  Name: Antonio Patterson MRN: [id number] Date of Birth: 1951-02-25  [facility] CM/SW Contact:    Antonio Jon Bloch, RN Phone Number: 10/17/2024, 1:29 PM  Clinical Narrative:                 Presents after falling from ladder. Suffered R distal femur fracture, R tibial plateau fracture, and L calcaneus fracture.          - s/p R femur ORIF 10/21  Pt will transition to Antonio Patterson (daughter) residence @ d/c:[address] [place] [address], [phone]. Orders noted for DME and home health needs. Pt agreeable. Pt without provider preference.Referral made with [facility] and accepted. We will need home health order /face to face for home health services from [facility]. DME referral made with [facility]. Equipment will be delivered to daughter's address prior to d/c.  Pt will need non-emergent  ambulance transportation to home @ d/c .  IM CM team following and will assist with needs...  Expected Discharge Plan: Home w Home Health Services Barriers to Discharge: Continued Medical Work up   Patient Goals and CMS Choice     Choice offered to / list presented to : Patient      Expected Discharge Plan and Services   Discharge Planning Services: CM Consult   Living arrangements for the past 2 months: Single Family Home                 DME Arranged: Community education officer wheelchair with seat cushion, Other see comment, Bedside commode (sliding board, hoyerlift,)   Date DME Agency Contacted: 10/17/24 Time DME Agency Contacted: 1259 Representative spoke with at DME Agency: Darlyn HH Arranged: PT, OT HH Agency: [facility] Date HH Agency Contacted: 10/17/24 Time HH Agency Contacted: 1329 Representative spoke with at [facility] Agency: [facility]  Prior Living Arrangements/Services Living arrangements for the past 2 months: Single Family Home Lives with:: Spouse Patient  language and need for interpreter reviewed:: Yes Do you feel safe going back to the place where you live?: Yes      Need for Family Participation in Patient Care: Yes (Comment) Care giver support system in place?: Yes (comment)   Criminal Activity/Legal Involvement Pertinent to Current Situation/Hospitalization: No - Comment as needed  Activities of Daily Living   ADL Screening (condition at time of admission) Independently performs ADLs?: Yes (appropriate for developmental age) Is the patient deaf or have difficulty hearing?: No Does the patient have difficulty seeing, even when wearing glasses/contacts?: No Does the patient have difficulty concentrating, remembering, or making decisions?: No  Permission Sought/Granted                  Emotional Assessment       Orientation: : Oriented to Self, Oriented to Place, Oriented to  Time, Oriented to Situation Alcohol / Substance Use: Not Applicable Psych Involvement: No (comment)  Admission diagnosis:  Closed displaced fracture of body of left calcaneus, initial encounter [S92.012A] Closed displaced supracondylar fracture of distal end of right femur with intracondylar extension, initial encounter (HCC) [S72.461A] Closed fracture of distal end of femur, unspecified fracture morphology, initial encounter [facility]) [S72.409A] Patient Active Problem List   Diagnosis Date Noted   Closed fracture of distal end of femur, unspecified fracture morphology, initial encounter (HCC) 10/13/2024   Closed displaced fracture of body of left calcaneus 10/13/2024   Closed displaced supracondylar fracture of distal end  of right femur with intracondylar extension, initial encounter (HCC) 10/13/2024   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   Dyspnea on exertion    Mixed diabetic hyperlipidemia associated with type 1 diabetes mellitus (HCC) 03/07/2019   BPH associated with nocturia 03/07/2019   Vitamin D  deficiency 03/07/2019   Morbid obesity (HCC) 01/03/2019   Mixed  diabetic hyperlipidemia associated with type 2 diabetes mellitus (HCC) 01/03/2019   Hypertension associated with diabetes (HCC) 01/03/2019   Screening for prostate cancer 03/28/2013   TINNITUS, CHRONIC, 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   ERECTILE DYSFUNCTION 11/13/2008   ALCOHOL USE 11/13/2008   Diabetes (HCC) 08/10/2007   Dyslipidemia 08/10/2007   Essential hypertension 08/10/2007   PCP:  Silvano Angeline FALCON, NP Pharmacy:   [facility] [address], [place] - [address] [facility] [place] [address] Phone: [phone] Fax: [phone]  [facility] [address] [place] [address] Phone: [phone] Fax: [phone]     Social Drivers of Health (SDOH) Social History: SDOH Screenings   Food Insecurity: No Food Insecurity (10/14/2024)  Housing: Low Risk  (10/14/2024)  Transportation Needs: No Transportation Needs (10/14/2024)  Utilities: Not At Risk (10/14/2024)  Depression (PHQ2-9): Low Risk  (01/18/2024)  Social Connections: Moderately Isolated (10/14/2024)  Tobacco Use: Medium Risk (10/14/2024)   SDOH Interventions:     Readmission Risk Interventions     No data to display

## 2024-10-17 NOTE — Progress Notes (Signed)
 Physical Therapy Treatment Patient Details Name: Antonio Patterson MRN: [id number] DOB: Jan 01, 1951 Today's Date: 10/17/2024   History of Present Illness Pt is a 73 y.o. male admitted 10/13/24 after fall off ladder sustaining R distal femur fracture with intraarticular extension, R tibial plateau fracture, and L calcaneus fracture. Pt s/p R femur ORIF 10/21. Other acute injuries will be managed non-operatively. PMHx: CAD s/p CABG, DM, HLD, and HTN.    PT Comments  Pt continues to be limited by RLE pain. He required +2 assist throughout transfers to manage/support RLE. Pt engaged in bed<>BSC and bed<>manual w/c lateral scoot transfers using sliding board. He reports preference to get out of bed leading with LLE and to get back into bed leading with RLE. Pt required CGA-minA to maintain truncal stability. He had difficulty when going up an incline and took increased time and required increased support. Moderate cues throughout for sequencing. Instructed pt to increase forward and lateral trunk lean the opposite direction he intend for his hips to go. Assist to place/remove and stabilize sliding board. Educated pt/family on BLE HEP. He performed a few exercises supine and seated. Patient will benefit from intensive inpatient follow-up therapy, >3 hours/day.    If plan is discharge home, recommend the following: A lot of help with walking and/or transfers;A lot of help with bathing/dressing/bathroom;Assistance with cooking/housework;Assist for transportation;Help with stairs or ramp for entrance   Can travel by private vehicle        Equipment Recommendations  Wheelchair cushion (measurements PT);Wheelchair (measurements PT);BSC/3in1;Other (comment) (elevating leg rests, removeable arm rest; drop-arm BSC; Sliding Board)    Recommendations for Other Services Rehab consult     Precautions / Restrictions Precautions Precautions: Fall Recall of Precautions/Restrictions: Intact Required Braces or  Orthoses: Splint/Cast Splint/Cast: Short leg spint LLE Splint/Cast - Date Prophylactic Dressing Applied (if applicable): 10/17/24 Restrictions Weight Bearing Restrictions Per Provider Order: Yes RLE Weight Bearing Per Provider Order: Non weight bearing LLE Weight Bearing Per Provider Order: Non weight bearing     Mobility  Bed Mobility Overal bed mobility: Needs Assistance Bed Mobility: Supine to Sit, Sit to Supine     Supine to sit: Min assist, Used rails Sit to supine: Min assist, Used rails   General bed mobility comments: Pt sat up on L side of bed with increased time. Cues for sequencing. Assist to manage RLE. Pt pulled up on bed rail and placed BUE behind him to help elevate trunk. Pt able to scoot fwd to EOB with RLE supported.    Transfers Overall transfer level: Needs assistance Equipment used: Sliding board Transfers: Bed to chair/wheelchair/BSC            Lateral/Scoot Transfers: Contact guard assist, Min assist, +2 safety/equipment, With slide board General transfer comment: Pt completed bed<>BSC transfer initially going to the left leading with LLE and returning to bed on the right placed at a slight incline (3) leading with RLE. Cues for sequencing. He verbalizes head-hips relationship but was leaning more laterally than anterior. Instructed pt to increase fwd flex over knee opposite the direction he wants to go. He required assist to place and align sliding board appropriate. Mobility Specialists managed RLE providing support throughout transfer d/t pt's limitation by pain and limiting any weight-acceptance in hip. Pt was able to slowly lift, shift, and lower. Support at trunk for stability. Increased assist for incline. Pt completed bed<>w/c transfer going to the left leading with LLE. Flipped w/c around in order to go back to bed. Pt reported disliking  this technique as he has to lean to the right more in order to get sliding board placed. As he fatigued pt required  increased assist. Cues for safety awareness throughout including hand placement on sliding board.    Ambulation/Gait               General Gait Details: Contraindicated, pt is NWB on BLE.   Stairs             Wheelchair Mobility     Tilt Bed    Modified Rankin (Stroke Patients Only)       Balance Overall balance assessment: Needs assistance Sitting-balance support: No upper extremity supported, Feet supported Sitting balance-Leahy Scale: Good                                      Communication Communication Communication: No apparent difficulties  Cognition Arousal: Alert Behavior During Therapy: WFL for tasks assessed/performed   PT - Cognitive impairments: No apparent impairments                         Following commands: Intact      Cueing Cueing Techniques: Verbal cues, Visual cues, Tactile cues  Exercises General Exercises - Lower Extremity Ankle Circles/Pumps: Supine, Right, AROM, 10 reps Quad Sets: Supine, Both, AROM, 10 reps (with 10 second hold) Straight Leg Raises: Supine, Left, AROM, 10 reps, Right, AAROM, 5 reps Hip Flexion/Marching: Seated, Left, AROM, 10 reps    General Comments General comments (skin integrity, edema, etc.): Pt with red linear mark and deflated appearing blister on posterior RLE, possible was pinched, but pt doesn't recall injurying it.      Pertinent Vitals/Pain Pain Assessment Pain Assessment: Faces Faces Pain Scale: Hurts even more Pain Location: RLE (posterior) Pain Descriptors / Indicators: Guarding, Grimacing, Discomfort Pain Intervention(s): Monitored during session, Limited activity within patient's tolerance, Repositioned    Home Living                          Prior Function            PT Goals (current goals can now be found in the care plan section) Acute Rehab PT Goals Patient Stated Goal: Return Home and regain independence PT Goal Formulation: With  patient/family Time For Goal Achievement: 10/29/24 Potential to Achieve Goals: Good Progress towards PT goals: Progressing toward goals    Frequency    Min 3X/week      PT Plan      Co-evaluation              AM-PAC PT 6 Clicks Mobility   Outcome Measure  Help needed turning from your back to your side while in a flat bed without using bedrails?: A Little Help needed moving from lying on your back to sitting on the side of a flat bed without using bedrails?: A Little Help needed moving to and from a bed to a chair (including a wheelchair)?: A Lot Help needed standing up from a chair using your arms (e.g., wheelchair or bedside chair)?: Total Help needed to walk in hospital room?: Total Help needed climbing 3-5 steps with a railing? : Total 6 Click Score: 11    End of Session Equipment Utilized During Treatment: Gait belt Activity Tolerance: Patient tolerated treatment well Patient left: in bed;with call bell/phone within reach;with family/visitor present Nurse Communication:  Mobility status PT Visit Diagnosis: Pain;Other abnormalities of gait and mobility (R26.89);Unsteadiness on feet (R26.81) Pain - Right/Left: Right Pain - part of body: Hip;Leg     Time: 8474-8394 PT Time Calculation (min) (ACUTE ONLY): 40 min  Charges:    $Therapeutic Exercise: 8-22 mins $Therapeutic Activity: 23-37 mins PT General Charges $$ ACUTE PT VISIT: 1 Visit                     Randall SAUNDERS, PT, DPT Acute Rehabilitation Services Office: [phone] Secure Chat Preferred  Antonio Patterson 10/17/2024, 5:10 PM

## 2024-10-17 NOTE — Progress Notes (Signed)
 Orthopedic Surgery Progress Note   Assessment: Patient is a 73 y.o. male with right distal femur fracture status post ORIF  Right tibial plateau fracture (non-op) Left calcaneus fracture (non-op)   Plan: -Operative plans: complete -Diet: diabetic -DVT ppx: xarelto  -Antibiotics: ancef  x2 post-op doses -Weight bearing status: NWB BLE -PT evaluate and treat -Pain control -Anticipate discharge to home tomorrow  ___________________________________________________________________________  Subjective: No acute events overnight. Pain well controlled. No complaints this morning. Feels he'll have his house set up for wheelchair accessibility tomorrow.    Physical Exam:  General: no acute distress, appears stated age Neurologic: alert, answering questions appropriately, following commands Respiratory: unlabored breathing on room air, symmetric chest rise Psychiatric: appropriate affect, normal cadence to speech  MSK:    -Right lower extremity  Dressings over leg c/d/i EHL/TA/GSC intact Plantarflexes and dorsiflexes toes Sensation intact to light touch in sural, saphenous, tibial, deep peroneal, and superficial peroneal nerve distributions Foot warm and well perfused, dopplerable DP and PT pulses, palpable PT pulse  -Left lower extremity  Splint in place Fires hip flexors, quadriceps, hamstrings, tibialis anterior, gastrocnemius and soleus, extensor hallucis longus Plantarflexes and dorsiflexes toes Sensation intact to light touch in sural, saphenous, tibial, deep peroneal, and superficial peroneal nerve distributions Foot warm and well perfused   Yesterday's total administered Morphine  Milligram Equivalents: 15   Patient name: Antonio Patterson Patient MRN: [id number] Date: 10/17/24

## 2024-10-17 NOTE — Progress Notes (Signed)
    Durable Medical Equipment  (From admission, onward)           Start     Ordered   10/17/24 1242  For home use only DME Other see comment  Once       Comments: Sliding Board, 2.5 ft  Question:  Length of Need  Answer:  6 Months   10/17/24 1241   10/17/24 1240  For home use only DME Other see comment  Once       Comments: Deitra lift  Question:  Length of Need  Answer:  6 Months   10/17/24 1240   10/15/24 1656  For home use only DME Bedside commode  Once       Comments: Drop arm BSC. Pt confine to one room  Question:  Patient needs a bedside commode to treat with the following condition  Answer:  Fx   10/15/24 1658   10/15/24 1655  For home use only DME lightweight manual wheelchair with seat cushion  Once       Comments: Patient suffers from  right distal femur fracture which impairs their ability to perform daily activities like bathing in the home.  A walker will not resolve  issue with performing activities of daily living. A wheelchair will allow patient to safely perform daily activities. Patient is not able to propel themselves in the home using a standard weight wheelchair due to general weakness. Patient can self propel in the lightweight wheelchair. Length of need 6 months . Accessories: elevating leg rests (ELRs), wheel locks, extensions and anti-tippers.   10/15/24 1655

## 2024-10-17 NOTE — Progress Notes (Signed)
   Inpatient Rehabilitation Admissions Coordinator   Per OT and family request, I will place rehab consult for full assessment of candidacy for possible Cir admit. TOC CM, Jon and Dr Georgina made aware for they were pursuing discharge home.  Heron Leavell, RN, MSN Rehab Admissions Coordinator [phone] 10/17/2024 2:25 PM

## 2024-10-17 NOTE — Plan of Care (Signed)
  Problem: Nutritional: Goal: Maintenance of adequate nutrition will improve Outcome: Progressing   Problem: Skin Integrity: Goal: Risk for impaired skin integrity will decrease Outcome: Progressing   Problem: Activity: Goal: Risk for activity intolerance will decrease Outcome: Progressing   Problem: Pain Managment: Goal: General experience of comfort will improve and/or be controlled Outcome: Progressing   Problem: Safety: Goal: Ability to remain free from injury will improve Outcome: Progressing   Problem: Skin Integrity: Goal: Risk for impaired skin integrity will decrease Outcome: Progressing

## 2024-10-18 ENCOUNTER — Inpatient Hospital Stay (HOSPITAL_COMMUNITY)
Admission: RE | Admit: 2024-10-18 | Discharge: 2024-10-24 | DRG: 560 | Disposition: A | Discharge status: Home / Self Care | Source: Intra-hospital | Attending: Physical Medicine and Rehabilitation | Admitting: Physical Medicine and Rehabilitation

## 2024-10-18 ENCOUNTER — Encounter (HOSPITAL_COMMUNITY): Payer: Self-pay | Admitting: Physical Medicine and Rehabilitation

## 2024-10-18 ENCOUNTER — Other Ambulatory Visit: Payer: Self-pay

## 2024-10-18 DIAGNOSIS — S728X1A Other fracture of right femur, initial encounter for closed fracture: Secondary | ICD-10-CM | POA: Diagnosis not present

## 2024-10-18 DIAGNOSIS — Z9852 Vasectomy status: Secondary | ICD-10-CM | POA: Diagnosis not present

## 2024-10-18 DIAGNOSIS — S92012A Displaced fracture of body of left calcaneus, initial encounter for closed fracture: Secondary | ICD-10-CM | POA: Diagnosis present

## 2024-10-18 DIAGNOSIS — Z7901 Long term (current) use of anticoagulants: Secondary | ICD-10-CM | POA: Diagnosis not present

## 2024-10-18 DIAGNOSIS — Z951 Presence of aortocoronary bypass graft: Secondary | ICD-10-CM | POA: Diagnosis not present

## 2024-10-18 DIAGNOSIS — D62 Acute posthemorrhagic anemia: Secondary | ICD-10-CM | POA: Diagnosis present

## 2024-10-18 DIAGNOSIS — E7849 Other hyperlipidemia: Secondary | ICD-10-CM | POA: Diagnosis present

## 2024-10-18 DIAGNOSIS — Z794 Long term (current) use of insulin: Secondary | ICD-10-CM

## 2024-10-18 DIAGNOSIS — S92002S Unspecified fracture of left calcaneus, sequela: Secondary | ICD-10-CM | POA: Diagnosis not present

## 2024-10-18 DIAGNOSIS — Z79899 Other long term (current) drug therapy: Secondary | ICD-10-CM | POA: Diagnosis not present

## 2024-10-18 DIAGNOSIS — E785 Hyperlipidemia, unspecified: Secondary | ICD-10-CM | POA: Diagnosis present

## 2024-10-18 DIAGNOSIS — E119 Type 2 diabetes mellitus without complications: Secondary | ICD-10-CM

## 2024-10-18 DIAGNOSIS — E871 Hypo-osmolality and hyponatremia: Secondary | ICD-10-CM | POA: Diagnosis present

## 2024-10-18 DIAGNOSIS — Z87891 Personal history of nicotine dependence: Secondary | ICD-10-CM

## 2024-10-18 DIAGNOSIS — R339 Retention of urine, unspecified: Secondary | ICD-10-CM | POA: Diagnosis present

## 2024-10-18 DIAGNOSIS — K5900 Constipation, unspecified: Secondary | ICD-10-CM | POA: Diagnosis present

## 2024-10-18 DIAGNOSIS — I2510 Atherosclerotic heart disease of native coronary artery without angina pectoris: Secondary | ICD-10-CM | POA: Diagnosis present

## 2024-10-18 DIAGNOSIS — K219 Gastro-esophageal reflux disease without esophagitis: Secondary | ICD-10-CM | POA: Diagnosis present

## 2024-10-18 DIAGNOSIS — Z4789 Encounter for other orthopedic aftercare: Secondary | ICD-10-CM | POA: Diagnosis not present

## 2024-10-18 DIAGNOSIS — M79605 Pain in left leg: Secondary | ICD-10-CM | POA: Diagnosis present

## 2024-10-18 DIAGNOSIS — S7291XS Unspecified fracture of right femur, sequela: Secondary | ICD-10-CM

## 2024-10-18 DIAGNOSIS — N401 Enlarged prostate with lower urinary tract symptoms: Secondary | ICD-10-CM

## 2024-10-18 DIAGNOSIS — R21 Rash and other nonspecific skin eruption: Secondary | ICD-10-CM | POA: Diagnosis not present

## 2024-10-18 DIAGNOSIS — W11XXXS Fall on and from ladder, sequela: Secondary | ICD-10-CM | POA: Diagnosis present

## 2024-10-18 DIAGNOSIS — I10 Essential (primary) hypertension: Secondary | ICD-10-CM | POA: Diagnosis present

## 2024-10-18 DIAGNOSIS — S82141S Displaced bicondylar fracture of right tibia, sequela: Secondary | ICD-10-CM | POA: Diagnosis not present

## 2024-10-18 DIAGNOSIS — E559 Vitamin D deficiency, unspecified: Secondary | ICD-10-CM | POA: Diagnosis present

## 2024-10-18 DIAGNOSIS — D72829 Elevated white blood cell count, unspecified: Secondary | ICD-10-CM | POA: Diagnosis not present

## 2024-10-18 DIAGNOSIS — E1169 Type 2 diabetes mellitus with other specified complication: Secondary | ICD-10-CM | POA: Diagnosis present

## 2024-10-18 DIAGNOSIS — Z8249 Family history of ischemic heart disease and other diseases of the circulatory system: Secondary | ICD-10-CM | POA: Diagnosis not present

## 2024-10-18 DIAGNOSIS — R739 Hyperglycemia, unspecified: Secondary | ICD-10-CM | POA: Diagnosis not present

## 2024-10-18 LAB — BASIC METABOLIC PANEL WITH GFR
Anion gap: 11 (ref 5–15)
BUN: 15 mg/dL (ref 8–23)
CO2: 23 mmol/L (ref 22–32)
Calcium: 8.8 mg/dL — ABNORMAL LOW (ref 8.9–10.3)
Chloride: 101 mmol/L (ref 98–111)
Creatinine, Ser: 0.93 mg/dL (ref 0.61–1.24)
GFR, Estimated: >60 mL/min (ref >60)
Glucose, Bld: 241 mg/dL — ABNORMAL HIGH (ref 70–99)
Potassium: 3.9 mmol/L (ref 3.5–5.1)
Sodium: 135 mmol/L (ref 135–145)

## 2024-10-18 LAB — GLUCOSE, CAPILLARY
Glucose-Capillary: 211 mg/dL — ABNORMAL HIGH (ref 70–99)
Glucose-Capillary: 173 mg/dL — ABNORMAL HIGH (ref 70–99)
Glucose-Capillary: 163 mg/dL — ABNORMAL HIGH (ref 70–99)
Glucose-Capillary: 223 mg/dL — ABNORMAL HIGH (ref 70–99)
Glucose-Capillary: 240 mg/dL — ABNORMAL HIGH (ref 70–99)

## 2024-10-18 MED ORDER — METHOCARBAMOL 500 MG PO TABS: 500.0000 mg | ORAL_TABLET | Freq: Four times a day (QID) | ORAL | Status: DC

## 2024-10-18 MED ORDER — RIVAROXABAN 10 MG PO TABS
10.0000 mg | ORAL_TABLET | Freq: Every day | ORAL | Status: DC
  Administered 2024-10-19 – 2024-10-24 (×6): 10 mg via ORAL
  Filled 2024-10-18 (×6): qty 1

## 2024-10-18 MED ORDER — HYDROCODONE-ACETAMINOPHEN 5-325 MG PO TABS: 1.0000 | ORAL_TABLET | ORAL | Status: DC | PRN

## 2024-10-18 MED ORDER — INSULIN ASPART 100 UNIT/ML IJ SOLN
0.0000 [IU] | Freq: Three times a day (TID) | INTRAMUSCULAR | Status: DC
  Administered 2024-10-18: 5 [IU] via SUBCUTANEOUS
  Administered 2024-10-19: 8 [IU] via SUBCUTANEOUS
  Administered 2024-10-19: 2 [IU] via SUBCUTANEOUS
  Administered 2024-10-19 – 2024-10-20 (×2): 5 [IU] via SUBCUTANEOUS
  Administered 2024-10-20 (×2): 3 [IU] via SUBCUTANEOUS
  Administered 2024-10-21: 8 [IU] via SUBCUTANEOUS
  Administered 2024-10-21: 2 [IU] via SUBCUTANEOUS
  Administered 2024-10-21: 3 [IU] via SUBCUTANEOUS
  Administered 2024-10-21: 5 [IU] via SUBCUTANEOUS
  Administered 2024-10-22 (×3): 3 [IU] via SUBCUTANEOUS
  Administered 2024-10-23: 2 [IU] via SUBCUTANEOUS
  Administered 2024-10-23 (×2): 5 [IU] via SUBCUTANEOUS
  Administered 2024-10-24: 2 [IU] via SUBCUTANEOUS
  Administered 2024-10-24: 3 [IU] via SUBCUTANEOUS

## 2024-10-18 MED ORDER — PANTOPRAZOLE SODIUM 40 MG PO TBEC
40.0000 mg | DELAYED_RELEASE_TABLET | Freq: Every day | ORAL | Status: DC
Start: 2024-10-19 — End: 2024-10-24
  Administered 2024-10-19 – 2024-10-24 (×6): 40 mg via ORAL
  Filled 2024-10-18 (×6): qty 1

## 2024-10-18 MED ORDER — HYDROCODONE-ACETAMINOPHEN 5-325 MG PO TABS
1.0000 | ORAL_TABLET | ORAL | Status: DC | PRN
  Administered 2024-10-18: 1 via ORAL
  Filled 2024-10-18: qty 1

## 2024-10-18 MED ORDER — INSULIN GLARGINE-YFGN 100 UNIT/ML ~~LOC~~ SOLN
15.0000 [IU] | Freq: Every day | SUBCUTANEOUS | Status: DC
  Administered 2024-10-18: 15 [IU] via SUBCUTANEOUS
  Filled 2024-10-18: qty 0.15

## 2024-10-18 MED ORDER — GUAIFENESIN-DM 100-10 MG/5ML PO SYRP: 5.0000 mL | ORAL_SOLUTION | Freq: Four times a day (QID) | ORAL | Status: DC | PRN

## 2024-10-18 MED ORDER — METOPROLOL SUCCINATE ER 50 MG PO TB24
50.0000 mg | ORAL_TABLET | Freq: Every day | ORAL | Status: DC
  Administered 2024-10-19 – 2024-10-24 (×6): 50 mg via ORAL
  Filled 2024-10-18 (×6): qty 1

## 2024-10-18 MED ORDER — HYDROCODONE-ACETAMINOPHEN 5-325 MG PO TABS
1.0000 | ORAL_TABLET | ORAL | Status: DC | PRN
  Administered 2024-10-19 (×2): 2 via ORAL
  Administered 2024-10-19: 1 via ORAL
  Administered 2024-10-20: 2 via ORAL
  Filled 2024-10-18 (×4): qty 2

## 2024-10-18 MED ORDER — SENNOSIDES-DOCUSATE SODIUM 8.6-50 MG PO TABS: 1.0000 | ORAL_TABLET | Freq: Two times a day (BID) | ORAL | Status: DC

## 2024-10-18 MED ORDER — BISACODYL 10 MG RE SUPP: 10.0000 mg | Freq: Every day | RECTAL | Status: DC | PRN

## 2024-10-18 MED ORDER — ALUM & MAG HYDROXIDE-SIMETH 200-200-20 MG/5ML PO SUSP: 30.0000 mL | ORAL | Status: DC | PRN

## 2024-10-18 MED ORDER — LOSARTAN POTASSIUM 25 MG PO TABS
100.0000 mg | ORAL_TABLET | Freq: Every day | ORAL | Status: DC
Start: 2024-10-19 — End: 2024-10-20
  Administered 2024-10-19 – 2024-10-20 (×2): 100 mg via ORAL
  Filled 2024-10-18 (×2): qty 4

## 2024-10-18 MED ORDER — SPIRONOLACTONE 25 MG PO TABS
25.0000 mg | ORAL_TABLET | Freq: Every day | ORAL | Status: DC
  Administered 2024-10-19 – 2024-10-24 (×6): 25 mg via ORAL
  Filled 2024-10-18 (×6): qty 1

## 2024-10-18 MED ORDER — SIMVASTATIN 20 MG PO TABS
40.0000 mg | ORAL_TABLET | Freq: Every day | ORAL | Status: DC
  Administered 2024-10-19 – 2024-10-24 (×6): 40 mg via ORAL
  Filled 2024-10-18 (×6): qty 2

## 2024-10-18 MED ORDER — POLYETHYLENE GLYCOL 3350 17 GM/SCOOP PO POWD: 17.0000 g | Freq: Every day | ORAL | Status: DC

## 2024-10-18 MED ORDER — ACETAMINOPHEN 325 MG PO TABS
325.0000 mg | ORAL_TABLET | ORAL | Status: DC | PRN
  Administered 2024-10-21 – 2024-10-23 (×2): 650 mg via ORAL
  Filled 2024-10-18: qty 2

## 2024-10-18 MED ORDER — TRAZODONE HCL 50 MG PO TABS: 25.0000 mg | ORAL_TABLET | Freq: Every evening | ORAL | Status: DC | PRN

## 2024-10-18 MED ORDER — RIVAROXABAN 10 MG PO TABS: 10.0000 mg | ORAL_TABLET | Freq: Every day | ORAL | Status: DC

## 2024-10-18 MED ORDER — TAMSULOSIN HCL 0.4 MG PO CAPS
0.4000 mg | ORAL_CAPSULE | Freq: Every day | ORAL | Status: DC
  Administered 2024-10-19 – 2024-10-24 (×6): 0.4 mg via ORAL
  Filled 2024-10-18 (×6): qty 1

## 2024-10-18 MED ORDER — SENNOSIDES-DOCUSATE SODIUM 8.6-50 MG PO TABS
1.0000 | ORAL_TABLET | Freq: Two times a day (BID) | ORAL | Status: DC
  Administered 2024-10-18 – 2024-10-24 (×8): 1 via ORAL
  Filled 2024-10-18 (×12): qty 1

## 2024-10-18 MED ORDER — METHOCARBAMOL 500 MG PO TABS
500.0000 mg | ORAL_TABLET | Freq: Four times a day (QID) | ORAL | Status: DC
  Administered 2024-10-18 – 2024-10-24 (×23): 500 mg via ORAL
  Filled 2024-10-18 (×23): qty 1

## 2024-10-18 MED ORDER — POLYETHYLENE GLYCOL 3350 17 GM/SCOOP PO POWD
17.0000 g | Freq: Every day | ORAL | Status: DC
  Filled 2024-10-18: qty 119

## 2024-10-18 MED ORDER — FLEET ENEMA RE ENEM: 1.0000 | ENEMA | Freq: Once | RECTAL | Status: DC | PRN

## 2024-10-18 NOTE — Progress Notes (Signed)
 Orthopedic Surgery Progress Note   Assessment: Patient is a 73 y.o. male with right distal femur fracture status post ORIF  Right tibial plateau fracture (non-op) Left calcaneus fracture (non-op)   Plan: -Operative plans: complete -Diet: diabetic -DVT ppx: xarelto (total of 3 months from surgery) -Antibiotics: ancef  x2 post-op doses -Weight bearing status: NWB BLE -PT evaluate and treat -Pain control -Anticipate discharge to IPR today  ___________________________________________________________________________  Subjective: No acute events overnight. Having some pain around the right knee. Pain well controlled with current medications. Has been working with PT and getting around in wheelchair.    Physical Exam:  General: no acute distress, appears stated age Neurologic: alert, answering questions appropriately, following commands Respiratory: unlabored breathing on room air, symmetric chest rise Psychiatric: appropriate affect, normal cadence to speech  MSK:    -Right lower extremity  Dressings over leg c/d/i EHL/TA/GSC intact Plantarflexes and dorsiflexes toes Sensation intact to light touch in sural, saphenous, tibial, deep peroneal, and superficial peroneal nerve distributions Foot warm and well perfused, dopplerable DP and PT pulses, palpable PT pulse  -Left lower extremity  Splint in place Fires hip flexors, quadriceps, hamstrings, tibialis anterior, gastrocnemius and soleus, extensor hallucis longus Plantarflexes and dorsiflexes toes Sensation intact to light touch in sural, saphenous, tibial, deep peroneal, and superficial peroneal nerve distributions Foot warm and well perfused   Yesterday's total administered Morphine  Milligram Equivalents: 5   Patient name: Antonio Patterson Patient MRN: [id number] Date: 10/18/24

## 2024-10-18 NOTE — PMR Pre-admission (Signed)
 PMR Admission Coordinator Pre-Admission Assessment  Patient: Antonio Patterson is an 73 y.o., male MRN: [id number] DOB: January 03, 1951 Height: 5' 7 (170.2 cm) Weight: 86.2 kg  Insurance Information HMO: yes    PPO:      PCP:      IPA:      80/20:      OTHER:  PRIMARY: Medicare A and B      Policy#: [id number]    Subscriber: self CM Name:        Phone#:       Fax#:   Pre-Cert#:        Employer:  Works PRN, retired engineer, civil (consulting) Benefits:  Phone #:      Name: Checked in passport one source [employer]. Date: 05/26/2015 Deduct: $1676      Out of Pocket Max: none      Life Max: n/a CIR: 100%      SNF: 100 days Outpatient: 80%     Co-Pay: 20% Home Health: 100%      Co-Pay: none DME: 80%     Co-Pay: 20% Providers: patient's choice SECONDARY: [employer]      Policy#: [id number]      Phone#:   Financial Counselor:       Phone#:   The "Data Collection Information Summary" for patients in Inpatient Rehabilitation Facilities with attached "Privacy Act Statement-Health Care Records" was provided and verbally reviewed with: Pt  Emergency Contact Information Contact Information     Name Relation Home Work Mobile   [place] Spouse   [phone]   Bullin,Michelle Daughter [phone]        Other Contacts   None on File     Current Medical History  Patient Admitting Diagnosis: Polytrauma s/p fall from ladder History of Present Illness: Pt is a 73 yo M with past medical history of CAD s/p CABG, DM, HLD, and HTN  who came to [facility] via EMS with chief complaints of right leg pain. Patient fell about 8 feet off of the ladder and landed on both of his feet. He was brought to ED at [facility] 10/13/24. Found to have R distal femur fracture with intraarticular extension, R tibial plateau fracture, and L calcaneus fracture. Left calcaneus could be treated nonoperatively and Pt s/p R femur ORIF 10/21. Pt. Now NWB on BLEs. Pt. Seen by PT/OT post operatively and they recommend CIR to assist return to PLOF.     Patient's medical record from [facility] has been reviewed by the rehabilitation admission coordinator and physician.  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 Yes  Family History   family history includes Cancer in his maternal grandmother and paternal grandfather; Heart disease in his father; Stroke in his maternal grandfather.  Current Medications  Current Facility-Administered Medications:    Chlorhexidine  Gluconate Cloth 2 % PADS 6 each, 6 each,  Topical, Daily, Georgina Ozell LABOR, MD, 6 each at 10/17/24 0940   HYDROcodone-acetaminophen  (NORCO/VICODIN) 5-325 MG per tablet 1-2 tablet, 1-2 tablet, Oral, Q4H PRN, Georgina Ozell LABOR, MD, 1 tablet at 10/18/24 0402   insulin  aspart (novoLOG ) injection 0-15 Units, 0-15 Units, Subcutaneous, TID WC, Moore, Michael A, MD, 5 Units at 10/18/24 0700   insulin  aspart (novoLOG ) injection 0-5 Units, 0-5 Units, Subcutaneous, QHS, Moore, Michael A, MD, 2 Units at 10/16/24 2148   insulin  glargine-yfgn (SEMGLEE) injection 15 Units, 15 Units, Subcutaneous, Daily, Georgina Ozell LABOR, MD   losartan  (COZAAR ) tablet 100 mg, 100 mg, Oral, Daily, Moore, Michael A, MD, 100 mg at 10/17/24 9061   methocarbamol (ROBAXIN) tablet 500 mg, 500 mg, Oral, QID, Moore, Michael A, MD, 500 mg at 10/17/24 2143   metoprolol  succinate (TOPROL -XL) 24 hr tablet 50 mg, 50  mg, Oral, Daily, Moore, Michael A, MD, 50 mg at 10/17/24 9061   mupirocin ointment (BACTROBAN) 2 % 1 Application, 1 Application, Nasal, BID, Georgina Ozell LABOR, MD, 1 Application at 10/17/24 2143   ondansetron  (ZOFRAN ) tablet 4 mg, 4 mg, Oral, Q6H PRN **OR** ondansetron  (ZOFRAN ) injection 4 mg, 4 mg, Intravenous, Q6H PRN, Georgina Ozell LABOR, MD   pantoprazole  (PROTONIX ) EC tablet 40 mg, 40 mg, Oral, Daily, Moore, Michael A, MD, 40 mg at 10/17/24 9061   polyethylene glycol (MIRALAX / GLYCOLAX) packet 17 g, 17 g, Oral, Daily, Georgina Ozell LABOR, MD, 17 g at 10/16/24 0809   rivaroxaban (XARELTO) tablet 10 mg, 10 mg, Oral, Daily, Georgina Ozell LABOR, MD, 10 mg at 10/17/24 9060   senna-docusate (Senokot-S) tablet 1 tablet, 1 tablet, Oral, BID, Georgina Ozell LABOR, MD, 1 tablet at 10/17/24 9062   simvastatin  (ZOCOR ) tablet 40 mg, 40 mg, Oral, Daily, Moore, Michael A, MD, 40 mg at 10/17/24 9060   spironolactone  (ALDACTONE ) tablet 25 mg, 25 mg, Oral, Daily, Moore, Michael A, MD, 25 mg at 10/17/24 9060   tamsulosin  (FLOMAX ) capsule 0.4 mg, 0.4 mg, Oral, Daily, Georgina Ozell LABOR, MD, 0.4 mg at 10/17/24 9061   tranexamic acid  (CYKLOKAPRON ) IVPB 1,000 mg, 1,000 mg, Intravenous, Once, Georgina Ozell LABOR, MD  Patients Current Diet:  Diet Order             Diet Carb Modified Fluid consistency: Thin  Diet effective now                   Precautions / Restrictions Precautions Precautions: Fall Restrictions Weight Bearing Restrictions Per Provider Order: Yes RLE Weight Bearing Per Provider Order: Non weight bearing LLE Weight Bearing Per Provider Order: Non weight bearing   Has the patient had 2 or more falls or a fall with injury in the past year? Yes  Prior Activity Level Community (5-7x/wk): Pt. working and active in the community PTA  Prior Functional Level Self Care: Did the patient need help bathing, dressing, using the toilet or eating? Independent  Indoor Mobility: Did the patient need assistance  with walking from room to room (with or without device)? Independent  Stairs: Did the patient need assistance with internal or external stairs (with or without device)? Independent  Functional Cognition: Did the patient need help planning regular tasks such as shopping or remembering to take medications? Independent  Patient Information Are you of Hispanic, Latino/a,or Spanish origin?: A. No, not of Hispanic, Latino/a, or Spanish origin What is your race?: A. White Do you need or want an interpreter to communicate with a doctor or health care staff?: 0. No  Patient's Response To:  Health Literacy and Transportation Is the patient able to respond to health literacy and transportation needs?: Yes Health Literacy - How often do you need to have someone help you when you read instructions, pamphlets, or other written material from your doctor or pharmacy?: Never In the past 12 months, has lack of transportation kept you from medical appointments or from getting medications?: No In the past 12 months, has lack of transportation kept you from meetings, work, or from getting things needed for daily living?: No  Home Assistive Devices / Equipment Home Equipment: Grab bars - tub/shower, Hand held shower head, Cane - single point, BSC/3in1, Agricultural Consultant (2 wheels)  Prior Device Use: Indicate devices/aids used by the patient prior to current illness, exacerbation or injury? None of the above  Current Functional Level Cognition  Orientation Level: Oriented X4    Extremity Assessment (includes Sensation/Coordination)  Upper Extremity Assessment: Generalized weakness, Overall WFL for tasks assessed (but needs to strengthen and maintain strength for all his mobility and transfers due to NWBing Bil LEs)  Lower Extremity Assessment: RLE deficits/detail, LLE deficits/detail RLE Deficits / Details: Pt POD 1 s/p femur ORIF. Decreased hip and knee AROM. Grossly 3-/5 strength. RLE Sensation: WNL RLE  Coordination: WNL LLE Deficits / Details: Pt is in a short leg splint spanning the ankle joint. Hip and knee AROM WFL. Grossly 3/5 strength. LLE: Unable to fully assess due to immobilization LLE Sensation: WNL LLE Coordination: WNL    ADLs  Overall ADL's : Needs assistance/impaired Eating/Feeding: Independent, Sitting Grooming: Set up, Sitting Upper Body Bathing: Set up, Sitting Lower Body Bathing: Moderate assistance, Bed level Upper Body Dressing : Set up, Sitting Lower Body Dressing: Total assistance, Sitting/lateral leans Toilet Transfer: Minimal assistance, Transfer board Toilet Transfer Details (indicate cue type and reason): Drop arm BSC with transfer board; pt Min A to use board to scoot to left with underwear on, total A to get underwear down with pt alternatingly lifting legs, pt max A +2 to scoot back with pillow case on transfer board due to no underwear on (no extra ones to put on)--A to scoot and for RLE. Toileting- Clothing Manipulation and Hygiene: Moderate assistance Toileting - Clothing Manipulation Details (indicate cue type and reason): total A for back peri care General ADL Comments: Back in to give pt theraband handout that does not require anchoring with one of his feet and dtr was there. Entered into another discussion with pt, dtr, wife and dtr (as well as wife) are really concerned about pt going home with the assist he currently needs. +2 A for some transfers, A for RLE, A for ADLs, and for getting into a car. Pt now agreeable to AIR consult.    Mobility  Overal bed mobility: Needs Assistance Bed Mobility: Supine to Sit, Sit to Supine Supine to sit: Min assist, Used rails Sit to supine: Min assist, Used rails General bed mobility comments: Pt sat up on L side of bed with increased time. Cues for sequencing. Assist to manage RLE. Pt pulled up on bed rail and placed BUE behind him to help elevate trunk. Pt able to scoot fwd to EOB with RLE supported.    Transfers   Overall transfer level: Needs assistance Equipment used: Sliding board Transfers: Bed to chair/wheelchair/BSC Bed to/from chair/wheelchair/BSC transfer type:: Lateral/scoot transfer  Lateral/Scoot Transfers: Contact guard assist, Min assist, +2 safety/equipment, With slide board General transfer comment: Pt completed bed<>BSC transfer initially going to the left leading with LLE  and returning to bed on the right placed at a slight incline (3) leading with RLE. Cues for sequencing. He verbalizes head-hips relationship but was leaning more laterally than anterior. Instructed pt to increase fwd flex over knee opposite the direction he wants to go. He required assist to place and align sliding board appropriate. Mobility Specialists managed RLE providing support throughout transfer d/t pt's limitation by pain and limiting any weight-acceptance in hip. Pt was able to slowly lift, shift, and lower. Support at trunk for stability. Increased assist for incline. Pt completed bed<>w/c transfer going to the left leading with LLE. Flipped w/c around in order to go back to bed. Pt reported disliking this technique as he has to lean to the right more in order to get sliding board placed. As he fatigued pt required increased assist. Cues for safety awareness throughout including hand placement on sliding board.    Ambulation / Gait / Stairs / Wheelchair Mobility  Ambulation/Gait General Gait Details: Contraindicated, pt is NWB on BLE. Naval Architect mobility: Yes Wheelchair propulsion: Both upper extremities Wheelchair parts: Needs Engineer, Materials Details (indicate cue type and reason): Pt declined further w/c propulsion tranining reporting he mobilized himself earlier with family assistance in hallway after OT session and feels confident navigating the w/c. Educated pt/family on w/c parts and features demonstrating how to use and remove the various components.    Posture / Balance  Dynamic Sitting Balance Sitting balance - Comments: Pt sat EOB statically with supervision and dynamically transferred with CGA Balance Overall balance assessment: Needs assistance Sitting-balance support: No upper extremity supported, Feet supported Sitting balance-Leahy Scale: Good Sitting balance - Comments: Pt sat EOB statically with supervision and dynamically transferred with CGA    Special considerations/life events  Special service needs NWB on BLEs   Previous Home Environment (from acute therapy documentation) Living Arrangements: Spouse/significant other  Lives With: Spouse Available Help at Discharge: Family, Available 24 hours/day Type of Home: House Home Layout: Two level, Able to live on main level with bedroom/bathroom, Full bath on main level Home Access: Stairs to enter Entrance Stairs-Rails: Right, Left Entrance Stairs-Number of Steps: 3 Bathroom Shower/Tub: Psychologist, counselling, Engineer, Manufacturing Systems: Pharmacist, Community: Yes Home Care Services: No  Discharge Living Setting Plans for Discharge Living Setting: Patient's home Type of Home at Discharge: House Discharge Home Layout: One level Discharge Home Access: Ramped entrance Discharge Bathroom Shower/Tub: Walk-in shower, Tub/shower unit Discharge Bathroom Toilet: Standard Discharge Bathroom Accessibility: Yes How Accessible: Accessible via walker Does the patient have any problems obtaining your medications?: No  Social/Family/Support Systems Patient Roles: Spouse Contact Information: [phone] Anticipated Caregiver: Odean Glee Anticipated Caregiver's Contact Information: Pt. wife can do light min A, but if he needs more, can go to daughter's home Caregiver Availability: 24/7 Discharge Plan Discussed with Primary Caregiver: Yes Is Caregiver In Agreement with Plan?: Yes Does Caregiver/Family have Issues with Lodging/Transportation while Pt is in Rehab?: No  Goals Patient/Family  Goal for Rehab: PT/OT Mod I wheelchair level Expected length of stay: 7-10 days Pt/Family Agrees to Admission and willing to participate: Yes Program Orientation Provided & Reviewed with Pt/Caregiver Including Roles  & Responsibilities: Yes  Decrease burden of Care through IP rehab admission: not anticipated  Possible need for SNF placement upon discharge: not anticipated   Patient Condition: I have reviewed medical records from [facility] , spoken with CM, and patient, spouse, and daughter. I met with patient at the bedside for inpatient rehabilitation assessment.  Patient will benefit from ongoing PT and OT, can actively participate in 3 hours of therapy a day 5 days of the week, and can make measurable gains during the admission.  Patient will also benefit from the coordinated team approach during an Inpatient Acute Rehabilitation admission.  The patient will receive intensive therapy as well as Rehabilitation physician, nursing, social worker, and care management interventions.  Due to safety, skin/wound care, disease management, medication administration, pain management, and patient education the patient requires 24 hour a day rehabilitation nursing.  The patient is currently min A with mobility and basic ADLs.  Discharge setting and therapy post discharge at home with home health is anticipated.  Patient has agreed to participate in the Acute Inpatient Rehabilitation Program and will admit today.  Preadmission Screen Completed By:  Leita KATHEE Kleine, 10/18/2024 9:34 AM ______________________________________________________________________   Discussed status with Dr. Emeline  on 10/18/24 at 941 and received approval for admission today.  Admission Coordinator:  Leita KATHEE Kleine, CCC-SLP, time 941/Date 10/18/24   Assessment/Plan: Diagnosis: Debility due to R femur and L calcaneal fractures Does the need for close, 24 hr/day Medical supervision in concert with the patient's rehab  needs make it unreasonable for this patient to be served in a less intensive setting? Yes Co-Morbidities requiring supervision/potential complications: DM with hyperglycemia, poor pain control, constipation, and leukocytosis Due to bowel management, safety, skin/wound care, disease management, medication administration, pain management, and patient education, does the patient require 24 hr/day rehab nursing? Yes Does the patient require coordinated care of a physician, rehab nurse, PT, OT to address physical and functional deficits in the context of the above medical diagnosis(es)? Yes Addressing deficits in the following areas: balance, endurance, locomotion, strength, transferring, bowel/bladder control, bathing, dressing, grooming, and toileting Can the patient actively participate in an intensive therapy program of at least 3 hrs of therapy 5 days a week? Yes The potential for patient to make measurable gains while on inpatient rehab is good Anticipated functional outcomes upon discharge from inpatient rehab: modified independent PT, modified independent OT Estimated rehab length of stay to reach the above functional goals is: 7-10 days Anticipated discharge destination: Home 10. Overall Rehab/Functional Prognosis: EXCELLENT   MD Signature:  Joesph JAYSON Emeline, DO 10/18/2024

## 2024-10-18 NOTE — Plan of Care (Signed)
  Problem: RH BOWEL ELIMINATION Goal: RH STG MANAGE BOWEL WITH ASSISTANCE Description: STG Manage Bowel with Assistance. Outcome: Progressing   Problem: RH PAIN MANAGEMENT Goal: RH STG PAIN MANAGED AT OR BELOW PT'S PAIN GOAL Outcome: Progressing

## 2024-10-18 NOTE — Plan of Care (Signed)
  Problem: Skin Integrity: Goal: Risk for impaired skin integrity will decrease Outcome: Progressing   Problem: Activity: Goal: Risk for activity intolerance will decrease Outcome: Progressing   Problem: Safety: Goal: Ability to remain free from injury will improve Outcome: Progressing   Problem: Skin Integrity: Goal: Risk for impaired skin integrity will decrease Outcome: Progressing   

## 2024-10-18 NOTE — Progress Notes (Signed)
 Inpatient Rehab Admissions Coordinator:    Pt. To admit to CIR today. RN may call report to [phone].  Pt. To admit to CIR for estimated 7-10 days with the goal of reaching mod I at wheelchair level and returning home with wife and/or daughter.  Leita Kleine, MS, CCC-SLP Rehab Admissions Coordinator  [phone] (celll) [phone] (office)

## 2024-10-18 NOTE — Discharge Summary (Signed)
 Orthopedic Surgery Discharge Summary  Patient name: RIKU BUTTERY Patient MRN: [id number] Admit date: 10/202/2025 Discharge date: 10/18/2024  Attending physician: Ozell Ada, MD Final diagnosis: right supracondylar femur fracture with intraarticular extension, left calcaneus fracture, right tibial plateau fracture, acute anemia from surgical blood loss, hyperglycemia with history of diabetes  Findings: right, displaced supracondylar femur fracture with intraarticular extension   Hospital course: Patient is a 73 y.o. male who was sustained a right femur fracture, right tibial plateau fracture, and left calcaneus fracture after fall from a ladder. The patient was admitted to the orthopedic service for planned operative intervention. The patient underwent right supracondylar femur fracture with intraarticular extension open reduction internal fixation on 10/14/2024. The patient had significant pain immediately after surgery, but pain eventually was controlled with a multimodal regimen including hydrocodone. Labs during the hospitalization revealed acute anemia from surgical blood loss that did not require any intervention and hyperglycemia associated with his diabetes that required insulin  administration. The patient worked with physical therapy who recommended discharge to inpatient rehab. The patient was tolerating an oral diet without issue and was voiding spontaneously after surgery. The patient's vitals were stable on the day of discharge. The patient was medically ready for discharge and was discharge to inpatient rehab on 10/18/2024.  Instructions:   Orthopedic Surgery Discharge Instructions  Patient name: ROEY COOPMAN Fracture: right distal femur fracture, right tibial plateau fracture, left calcaneus fracture Procedure Performed: right distal femur fracture open reduction internal fixation Date of Surgery: 10/14/2024 Surgeon: Ozell Ada, MD  Activity: You should not put any  weight on either lower extremity. Weight bearing can cause displacement of you calcaneus fracture. The plate and screws are not designed to put weight through them and they can break or your fracture can split apart further with weight bearing.   Incision Care: Your incision sites have dressings over them. That dressings should remain in place and dry at all times for a total of one week after surgery. After one week, you can remove the dressings. Underneath the dressings, you will find skin staples. You should leave these staples in place. They will be taken out in the office when the wound has healed. Do not pick, rub, or scrub at them. Do not put cream or lotion over the surgical area. After one week and once the dressing is off, it is okay to let soap and water run over your incision. Again, do not pick, scrub, or rub at the staples when bathing. Do not submerge (e.g., take a bath, swim, go in a hot tub, etc.) until six weeks after surgery. There may be some bloody drainage from the incision into the dressing after surgery. This is normal. You do not need to replace the dressing. Continue to leave it in place for the one week as instructed above. Should the dressing become saturated with blood or drainage, please call the office for further instructions.   Medications: You have been prescribed Norco. This is a narcotic pain medication and should only be taken as prescribed. You should not drink alcohol or operate heavy machinery (including driving) while taking this medication. The Norco can cause constipation as a side effect. For that reason, you have been prescribed senna and miralax. These are both laxatives. You do not need to take this medication if you develop diarrhea. Should you remain constipated even while taking the senna and miralax, please use the miralax twice daily.  You have been prescribed Xarelto as a blood thinner. This medication  is to be taken to prevent blood clots. Take 10  milligrams every daily. You should refrain from using other blood thinners (warfarin, apixaban, plavix, xarelto, etc.) while using the Xarelto. You will need to take this medication for a total of 6 weeks after your surgery.   You should not use over-the-counter NSAIDs (ibuprofen, Aleve, Celebrex, naproxen, meloxicam, etc.) as there is evidence that these medications decrease fracture healing potential.   In order to set expectations for opioid prescriptions, you will only be prescribed opioids for a total of six weeks after surgery and, at two-weeks after surgery, your opioid prescription will start to tapered (decreased dosage and number of pills). If you have ongoing need for opioid medication six weeks after surgery, you will be referred to pain management. If you are already established with a provider that is giving you opioid medications, you should schedule an appointment with them for six weeks after surgery if you feel you are going to need another prescription. State law only allows for opioid prescriptions one week at a time. If you are running out of opioid medication near the end of the week, please call the office during business hours before running out so I can send you another prescription.   Driving: You should not drive while taking narcotic pain medications. You should start getting back to driving slowly and you may want to try driving in a parking lot before doing anything more.   Diet: You are safe to resume your regular diet after surgery.   Reasons to Call the Office After Surgery: You should feel free to call the office with any concerns or questions you have in the post-operative period, but you should definitely notify the office if you develop: -shortness of breath, chest pain, or trouble breathing -excessive bleeding, drainage, redness, or swelling around the surgical site -fevers, chills, or pain that is getting worse with each passing day -persistent nausea or  vomiting -new weakness in any extremity, new or worsening numbness or tingling in any extremity -numbness in the groin, bowel or bladder incontinence -other concerns about your surgery  Follow Up Appointments: You have a follow up visit scheduled with Dr. Georgina on 11/05/2024 at 9:15am. The office location and phone number are listed below.   Office Information:  -Ozell Georgina, MD -Phone number: [phone] -Address: [address]       [place], [place] [address]   ===========================================  Information on my medicine - XARELTO (Rivaroxaban)  This medication education was reviewed with me or my healthcare representative as part of my discharge preparation.    Why was Xarelto prescribed for you? Xarelto was prescribed for you to reduce the risk of blood clots forming after orthopedic surgery. The medical term for these abnormal blood clots is venous thromboembolism (VTE).  What do you need to know about xarelto ? Take your Xarelto ONCE DAILY at the same time every day. You may take it either with or without food.  If you have difficulty swallowing the tablet whole, you may crush it and mix in applesauce just prior to taking your dose.  Take Xarelto exactly as prescribed by your doctor and DO NOT stop taking Xarelto without talking to the doctor who prescribed the medication.  Stopping without other VTE prevention medication to take the place of Xarelto may increase your risk of developing a clot.  After discharge, you should have regular check-up appointments with your healthcare provider that is prescribing your Xarelto.    What do you do  if you miss a dose? If you miss a dose, take it as soon as you remember on the same day then continue your regularly scheduled once daily regimen the next day. Do not take two doses of Xarelto on the same day.   Important Safety Information A possible side effect of Xarelto is bleeding. You should call your  healthcare provider right away if you experience any of the following: Bleeding from an injury or your nose that does not stop. Unusual colored urine (red or dark brown) or unusual colored stools (red or black). Unusual bruising for unknown reasons. A serious fall or if you hit your head (even if there is no bleeding).  Some medicines may interact with Xarelto and might increase your risk of bleeding while on Xarelto. To help avoid this, consult your healthcare provider or pharmacist prior to using any new prescription or non-prescription medications, including herbals, vitamins, non-steroidal anti-inflammatory drugs (NSAIDs) and supplements.  This website has more information on Xarelto: visitdestination.com.br.

## 2024-10-18 NOTE — H&P (Addendum)
 Physical Medicine and Rehabilitation Admission H&P    Chief complaint: Bilateral leg pain: HPI:  Antonio Patterson is a 73 y.o. 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  He presented to the [facility] ER on 10-20 after falling off of a ladder.  He immediately felt pain in his right thigh, knee, and left heel.  ER workup significant for right femur, right tibial plateau, and left calcaneal fractures.  He was taken to the OR by Dr. Georgina who performed right femur ORIF.  Tibial plateau fracture and calcaneal fracture were managed nonoperatively, and patient was made nonweightbearing bilaterally.  Postop course was complicated by hyperglycemia secondary to diabetes, postop anemia, and poor pain control.  He was evaluated by PT/OT and determined appropriate for inpatient rehab admission on 10 - 25 - 25.      Review of Systems  Constitutional:  Negative for chills and fever.  Respiratory:  Negative for cough and shortness of breath.   Cardiovascular:  Positive for leg swelling. Negative for chest pain.  Gastrointestinal:  Negative for constipation, diarrhea, nausea and vomiting.  Genitourinary:  Negative for dysuria.  Neurological:  Negative for dizziness, sensory change, focal weakness and headaches.  Psychiatric/Behavioral:  Negative for depression. The patient does not have insomnia.    Past Medical History:  Diagnosis Date   ALLERGIC RHINITIS 08/10/2007   BLADDER OUTLET OBSTRUCTION 11/13/2008    Coronary artery disease    DIABETES MELLITUS, TYPE II 08/10/2007   Dyspnea    with exertion   ERECTILE DYSFUNCTION 11/13/2008   History of hiatal hernia    History of kidney stones    HYPERLIPIDEMIA 08/10/2007   HYPERTENSION 08/10/2007   PERS HX NONCOMPLIANCE W/MED TX PRS HAZARDS HLTH 11/13/2008   Seborrhea 11/13/2008   Sleep apnea    TINNITUS, CHRONIC, 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   UNSPECIFIED PERIPHERAL VASCULAR DISEASE 11/13/2008   Past Surgical History:  Procedure Laterality Date   CORONARY ARTERY BYPASS GRAFT N/A 08/17/2022   Procedure: CORONARY ARTERY BYPASS GRAFTING (CABG) X3 BYPASSES USING OPEN LEFT INTERNAL MAMMARY ARTERY AND ENDOSCOPIC RIGHT GREATER SAPHENOUS VEIN HARVEST.;  Surgeon: Shyrl Linnie KIDD, MD;  Location: [facility] OR;  Service: Open Heart Surgery;  Laterality: N/A;   CORONARY PRESSURE/FFR STUDY N/A 07/14/2022   Procedure: INTRAVASCULAR PRESSURE WIRE/FFR STUDY;  Surgeon: Mady Bruckner, MD;  Location: [facility] INVASIVE CV LAB;  Service: Cardiovascular;  Laterality: N/A;   RIGHT/LEFT HEART CATH AND CORONARY ANGIOGRAPHY N/A 07/14/2022   Procedure: RIGHT/LEFT HEART CATH AND CORONARY ANGIOGRAPHY;  Surgeon: Mady Bruckner, MD;  Location: [facility] INVASIVE CV LAB;  Service: Cardiovascular;  Laterality: N/A;   TEE WITHOUT CARDIOVERSION N/A 08/17/2022   Procedure: TRANSESOPHAGEAL ECHOCARDIOGRAM (TEE);  Surgeon: Shyrl Linnie KIDD, MD;  Location: [facility] OR;  Service: Open Heart Surgery;  Laterality: N/A;   TURBINATE REDUCTION Bilateral 2017   VASECTOMY  1976   Family History  Problem Relation Age of Onset   Heart disease Father    Cancer Maternal Grandmother        breast   Stroke Maternal Grandfather    Cancer Paternal Grandfather    Social History:  reports that he quit smoking about 20  years ago. His smoking use included cigarettes. He started smoking about 64 years ago. He has a 10 pack-year smoking history. He has never used smokeless tobacco. He reports current alcohol use. He reports  that he does not use drugs. Allergies:  Allergies  Allergen Reactions   Lipitor [Atorvastatin ]     Muscle pain   Metformin  Diarrhea   Rosuvastatin     Muscle pain   Sulfa Antibiotics Rash   Sulfonamide Derivatives Rash   Medications Prior to Admission  Medication Sig Dispense Refill   [Paused] aspirin  EC 81 MG tablet Take 1 tablet (81 mg total) by mouth daily.     glucose blood test strip 1 each by Other route in the morning, at noon, in the evening, and at bedtime.      HYDROcodone-acetaminophen  (NORCO/VICODIN) 5-325 MG tablet Take 1 tablet by mouth every 4 (four) hours as needed for up to 7 days for severe pain (pain score 7-10) or moderate pain (pain score 4-6).     INCLISIRAN SODIUM  [place] Inject into the skin.     insulin  aspart (NOVOLOG  FLEXPEN RELION) 100 UNIT/ML FlexPen Inject 5-40 Units into the skin See admin instructions. 3-5 times per day, and pen needles 1/day 45 mL 11   losartan  (COZAAR ) 100 MG tablet Take 1 tablet by mouth once daily (Patient taking differently: Take 50 mg by mouth daily.) 90 tablet 3   methocarbamol (ROBAXIN) 500 MG tablet Take 1 tablet (500 mg total) by mouth 4 (four) times daily for 10 days.     metoprolol  succinate (TOPROL -XL) 50 MG 24 hr tablet Take 50 mg by mouth daily. Take with or immediately following a meal.     omeprazole  (PRILOSEC) 40 MG capsule Take 40 mg by mouth daily.     oxymetazoline (AFRIN) 0.05 % nasal spray Place 1 spray into both nostrils at bedtime.     polyethylene glycol powder (GLYCOLAX/MIRALAX) 17 GM/SCOOP powder Dissolve 1 capful (17g) in 4-8 ounces of liquid and take by mouth daily.     rivaroxaban (XARELTO) 10 MG TABS tablet Take 1 tablet (10 mg total) by mouth daily.     senna-docusate (SENOKOT-S) 8.6-50 MG tablet Take 1 tablet by mouth 2 (two) times daily for 14 days.     simvastatin  (ZOCOR ) 40 MG tablet Take 40 mg by mouth daily.     spironolactone  (ALDACTONE ) 25 MG tablet Take 1 tablet by mouth once daily 90 tablet 3    tamsulosin  (FLOMAX ) 0.4 MG CAPS capsule Take 1 capsule (0.4 mg total) by mouth daily. 90 capsule 1   [Paused] traMADol  (ULTRAM ) 50 MG tablet Take 1 tablet (50 mg total) by mouth every 6 (six) hours as needed for moderate pain. 28 tablet 0      Home: Home Living Family/patient expects to be discharged to:: Private residence Living Arrangements: Spouse/significant other, Children     Prior Functional Level Self Care: Did the patient need help bathing, dressing, using the toilet or eating? Independent   Indoor Mobility: Did the patient need assistance with walking from room to room (with or without device)? Independent   Stairs: Did the patient need assistance with internal or external stairs (with or without device)? Independent   Functional Cognition: Did the patient need help planning regular tasks such as shopping or remembering to take medications? Independent    Functional Status:    Cognition   Orientation Level: Oriented X4    Extremity Assessment (includes Sensation/Coordination)   Upper Extremity Assessment: Generalized weakness, Overall [facility]  for tasks assessed (but needs to strengthen and maintain strength for all his mobility and transfers due to NWBing Bil LEs)  Lower Extremity Assessment: RLE deficits/detail, LLE deficits/detail RLE Deficits / Details: Pt POD 1 s/p femur ORIF. Decreased hip and knee AROM. Grossly 3-/5 strength. RLE Sensation: WNL RLE Coordination: WNL LLE Deficits / Details: Pt is in a short leg splint spanning the ankle joint. Hip and knee AROM WFL. Grossly 3/5 strength. LLE: Unable to fully assess due to immobilization LLE Sensation: WNL LLE Coordination: WNL     ADLs   Overall ADL's : Needs assistance/impaired Eating/Feeding: Independent, Sitting Grooming: Set up, Sitting Upper Body Bathing: Set up, Sitting Lower Body Bathing: Moderate assistance, Bed level Upper Body Dressing : Set up, Sitting Lower Body Dressing: Total assistance,  Sitting/lateral leans Toilet Transfer: Minimal assistance, Transfer board Toilet Transfer Details (indicate cue type and reason): Drop arm BSC with transfer board; pt Min A to use board to scoot to left with underwear on, total A to get underwear down with pt alternatingly lifting legs, pt max A +2 to scoot back with pillow case on transfer board due to no underwear on (no extra ones to put on)--A to scoot and for RLE. Toileting- Clothing Manipulation and Hygiene: Moderate assistance Toileting - Clothing Manipulation Details (indicate cue type and reason): total A for back peri care General ADL Comments: Back in to give pt theraband handout that does not require anchoring with one of his feet and dtr was there. Entered into another discussion with pt, dtr, wife and dtr (as well as wife) are really concerned about pt going home with the assist he currently needs. +2 A for some transfers, A for RLE, A for ADLs, and for getting into a car. Pt now agreeable to AIR consult.     Mobility   Overal bed mobility: Needs Assistance Bed Mobility: Supine to Sit, Sit to Supine Supine to sit: Min assist, Used rails Sit to supine: Min assist, Used rails General bed mobility comments: Pt sat up on L side of bed with increased time. Cues for sequencing. Assist to manage RLE. Pt pulled up on bed rail and placed BUE behind him to help elevate trunk. Pt able to scoot fwd to EOB with RLE supported.     Transfers   Overall transfer level: Needs assistance Equipment used: Sliding board Transfers: Bed to chair/wheelchair/BSC Bed to/from chair/wheelchair/BSC transfer type:: Lateral/scoot transfer  Lateral/Scoot Transfers: Contact guard assist, Min assist, +2 safety/equipment, With slide board General transfer comment: Pt completed bed<>BSC transfer initially going to the left leading with LLE and returning to bed on the right placed at a slight incline (3) leading with RLE. Cues for sequencing. He verbalizes head-hips  relationship but was leaning more laterally than anterior. Instructed pt to increase fwd flex over knee opposite the direction he wants to go. He required assist to place and align sliding board appropriate. Mobility Specialists managed RLE providing support throughout transfer d/t pt's limitation by pain and limiting any weight-acceptance in hip. Pt was able to slowly lift, shift, and lower. Support at trunk for stability. Increased assist for incline. Pt completed bed<>w/c transfer going to the left leading with LLE. Flipped w/c around in order to go back to bed. Pt reported disliking this technique as he has to lean to the right more in order to get sliding board placed. As he fatigued pt required increased assist. Cues for safety awareness throughout including hand placement on sliding board.  Ambulation / Gait / Stairs / Wheelchair Mobility   Ambulation/Gait General Gait Details: Contraindicated, pt is NWB on BLE. Naval Architect mobility: Yes Wheelchair propulsion: Both upper extremities Wheelchair parts: Needs Engineer, Materials Details (indicate cue type and reason): Pt declined further w/c propulsion tranining reporting he mobilized himself earlier with family assistance in hallway after OT session and feels confident navigating the w/c. Educated pt/family on w/c parts and features demonstrating how to use and remove the various components.     Posture / Balance Dynamic Sitting Balance Sitting balance - Comments: Pt sat EOB statically with supervision and dynamically transferred with CGA Balance Overall balance assessment: Needs assistance Sitting-balance support: No upper extremity supported, Feet supported Sitting balance-Leahy Scale: Good Sitting balance - Comments: Pt sat EOB statically with supervision and dynamically transferred with CGA     Special considerations/life events  Special service needs NWB on BLEs    Physical Exam: Blood pressure (!)  104/48, pulse 80, temperature 98.7 F (37.1 C), temperature source Oral, resp. rate 18, height 5' 7 (1.702 m), weight 85.9 kg, SpO2 98%.  Constitutional: No apparent distress. Appropriate appearance for age.  HENT: No JVD. Neck Supple. Trachea midline. Atraumatic, normocephalic. Eyes: PERRLA. EOMI. Visual fields grossly intact.  Cardiovascular: RRR, no murmurs/rub/gallops. 2+ R hip Edema. Peripheral pulses 2+  Respiratory: CTAB. No rales, rhonchi, or wheezing. On RA.  Abdomen: + bowel sounds, normoactive. No distention or tenderness.  GU: Not examined.   Skin: C/D/I. No apparent lesions. + Right lateral thigh surgical dressing, with minimal drainage + Intact Mepilex dressings on right anterior thigh + Healing abrasion to right hand, elbow   MSK:      No apparent deformity. + Left lower extremity postop splint   Neurologic exam:  Cognition: AAO to person, place, time and event.   Language: Fluent, No substitutions or neoglisms. No dysarthria.  Memory: No apparent deficits Insight: Good   insight into current condition.  Mood: Pleasant affect, appropriate mood.  Sensation: Intact to light touch in BL UE and Les   Reflexes: 2+ in BL UE and LEs.   CN: 2-12 grossly intact.   Coordination: No apparent tremors. No ataxia  Spasticity: MAS 0 in all extremities.    Strength:  5 out of 5 bilateral upper extremities 2 of 5 right hip, knee extension, 4/5 dorsiflexion and plantarflexion 4/5 left hip, 2/5 knee extension, toes 4/5  Results for orders placed or performed during the hospital encounter of 10/18/24 (from the past 48 hours)  Glucose, capillary     Status: Abnormal   Collection Time: 10/18/24  4:51 PM  Result Value Ref Range   Glucose-Capillary 223 (H) 70 - 99 mg/dL    Comment: Glucose reference range applies only to samples taken after fasting for at least 8 hours.  Basic metabolic panel     Status: Abnormal   Collection Time: 10/18/24  6:41 PM  Result Value Ref Range    Sodium 135 135 - 145 mmol/L   Potassium 3.9 3.5 - 5.1 mmol/L   Chloride 101 98 - 111 mmol/L   CO2 23 22 - 32 mmol/L   Glucose, Bld 241 (H) 70 - 99 mg/dL    Comment: Glucose reference range applies only to samples taken after fasting for at least 8 hours.   BUN 15 8 - 23 mg/dL   Creatinine, Ser 9.06 0.61 - 1.24 mg/dL   Calcium  8.8 (L) 8.9 - 10.3 mg/dL   GFR, Estimated >39 >39 mL/min  Comment: (NOTE) Calculated using the CKD-EPI Creatinine Equation (2021)    Anion gap 11 5 - 15    Comment: Performed at [facility], [address], [place], [place] [address]  Glucose, capillary     Status: Abnormal   Collection Time: 10/18/24  8:48 PM  Result Value Ref Range   Glucose-Capillary 240 (H) 70 - 99 mg/dL    Comment: Glucose reference range applies only to samples taken after fasting for at least 8 hours.   No results found.    Blood pressure (!) 104/48, pulse 80, temperature 98.7 F (37.1 C), temperature source Oral, resp. rate 18, height 5' 7 (1.702 m), weight 85.9 kg, SpO2 98%.  Medical Problem List and Plan: 1. Functional deficits secondary to right femur fracture, right tibial plateau fracture, and left calcaneal fracture status post fall, status post right distal femur ORIF by Dr. Georgina 10-21  -patient may not shower until surgical dressings removed  -ELOS/Goals: 7 to 10 days, modified independent PT/OT  - Stable for admission to inpatient rehab  2.  Antithrombotics: -DVT/anticoagulation:  Pharmaceutical: Xarelto for 6 weeks postop  -antiplatelet therapy: Aspirin  held while on Xarelto 3. Pain Management: Robaxin 500 mg 4 times daily, Norco 1-2 tabs every 4 hours as needed, as needed Tylenol   - Per discharge, start opiate taper at 2 weeks postop, finish taper at 6 weeks postsurgery  4. Mood/Behavior/Sleep: None  -antipsychotic agents: None 5. Neuropsych/cognition: This patient is capable of making decisions on his own behalf. 6. Skin/Wound Care: Maintain surgical  dressing for 1 week postop 7. Fluids/Electrolytes/Nutrition: Labs in a.m. 8.  Anemia.  Labs in AM. 9.  Type 2 diabetes.  On sliding scale insulin . 10.  Hypertension.  On metoprolol , Aldactone , Cozaar . 11.  Urinary retention.  On Flomax . 12.  Constipation.  Senokot S1 tab twice daily, having regular bowel movements now. 13.  CAD s/p CABG. Aspirin  on hold.  On simvastatin . 14.  Acid reflux.  Takes Pepcid  at home, Protonix  40 mg daily formulary. 15.  Left calcaneal fracture.  Managed nonoperatively, strict nonweightbearing.  Follow-up with Dr. Georgina. 16.  Right femur and tibial plateau  fractures.  Right femur ORIF, tibial plateau nonop.  Nonweightbearing.  Follow-up with Dr. Georgina.   Joesph JAYSON Likes, DO 10/18/2024

## 2024-10-18 NOTE — Progress Notes (Signed)
 Inpatient Rehabilitation Admission Medication Review by a Pharmacist  A complete drug regimen review was completed for this patient to identify any potential clinically significant medication issues.  High Risk Drug Classes Is patient taking? Indication by Medication  Antipsychotic No   Anticoagulant Yes Xarelto - VTE prophylaxis  Antibiotic No   Opioid Yes Norco - pain  Antiplatelet No   Hypoglycemics/insulin  Yes Insulin  SSI - DM2  Vasoactive Medication Yes Losartan , Toprol  XL - HTN, CHF  Chemotherapy No   Other Yes Simvastatin  - cholesterol Spironolactone  - CHF Tamsulosin  - urinary retention fleet enema , PEG , bisacodyl  , Senokot-S , and - constipation Maalox- indigestion Pantoprazole - reflux  Acetaminophen - pain  Robitussin- cough  Robaxin- muscle spasms   trazodone and -insomnia     Type of Medication Issue Identified Description of Issue Recommendation(s)  Drug Interaction(s) (clinically significant)     Duplicate Therapy     Allergy     No Medication Administration End Date     Incorrect Dose     Additional Drug Therapy Needed     Significant med changes from prior encounter (inform family/care partners about these prior to discharge). PTA aspirin , inclisiran, aftrin nasal spray, tramadol  and novolog  flex pen not continued. Communicate relevant medication changes to patient/family members at discharge from CIR.   Restart or discontinue PTA meds not resumed in CIR at discharge if clinically indicated.   Other       Clinically significant medication issues were identified that warrant physician communication and completion of prescribed/recommended actions by midnight of the next day:  No  Name of provider notified for urgent issues identified: N/A  Provider Method of Notification: N/A    Pharmacist comments: N/A  Time spent performing this drug regimen review (minutes):  20   Rocky Slade, PharmD, BCPS 10/18/2024 4:11 PM

## 2024-10-19 DIAGNOSIS — D62 Acute posthemorrhagic anemia: Secondary | ICD-10-CM

## 2024-10-19 DIAGNOSIS — R739 Hyperglycemia, unspecified: Secondary | ICD-10-CM

## 2024-10-19 DIAGNOSIS — I10 Essential (primary) hypertension: Secondary | ICD-10-CM | POA: Diagnosis not present

## 2024-10-19 DIAGNOSIS — S728X1A Other fracture of right femur, initial encounter for closed fracture: Secondary | ICD-10-CM | POA: Diagnosis not present

## 2024-10-19 DIAGNOSIS — D72829 Elevated white blood cell count, unspecified: Secondary | ICD-10-CM

## 2024-10-19 LAB — COMPREHENSIVE METABOLIC PANEL WITH GFR
ALT: 16 U/L (ref 0–44)
AST: 24 U/L (ref 15–41)
Albumin: 2.8 g/dL — ABNORMAL LOW (ref 3.5–5.0)
Alkaline Phosphatase: 46 U/L (ref 38–126)
Anion gap: 10 (ref 5–15)
BUN: 16 mg/dL (ref 8–23)
CO2: 25 mmol/L (ref 22–32)
Calcium: 8.8 mg/dL — ABNORMAL LOW (ref 8.9–10.3)
Chloride: 101 mmol/L (ref 98–111)
Creatinine, Ser: 0.92 mg/dL (ref 0.61–1.24)
GFR, Estimated: >60 mL/min (ref >60)
Glucose, Bld: 189 mg/dL — ABNORMAL HIGH (ref 70–99)
Potassium: 4.1 mmol/L (ref 3.5–5.1)
Sodium: 136 mmol/L (ref 135–145)
Total Bilirubin: 1.1 mg/dL (ref 0.0–1.2)
Total Protein: 5.8 g/dL — ABNORMAL LOW (ref 6.5–8.1)

## 2024-10-19 LAB — CBC WITH DIFFERENTIAL/PLATELET
Abs Immature Granulocytes: 0.04 K/uL (ref 0.00–0.07)
Basophils Absolute: 0.1 K/uL (ref 0.0–0.1)
Basophils Relative: 1 %
Eosinophils Absolute: 0.9 K/uL — ABNORMAL HIGH (ref 0.0–0.5)
Eosinophils Relative: 8 %
HCT: 31.6 % — ABNORMAL LOW (ref 39.0–52.0)
Hemoglobin: 10.4 g/dL — ABNORMAL LOW (ref 13.0–17.0)
Immature Granulocytes: 0 %
Lymphocytes Relative: 21 %
Lymphs Abs: 2.5 K/uL (ref 0.7–4.0)
MCH: 29.2 pg (ref 26.0–34.0)
MCHC: 32.9 g/dL (ref 30.0–36.0)
MCV: 88.8 fL (ref 80.0–100.0)
Monocytes Absolute: 1.2 K/uL — ABNORMAL HIGH (ref 0.1–1.0)
Monocytes Relative: 10 %
Neutro Abs: 7.2 K/uL (ref 1.7–7.7)
Neutrophils Relative %: 60 %
Platelets: 276 K/uL (ref 150–400)
RBC: 3.56 MIL/uL — ABNORMAL LOW (ref 4.22–5.81)
RDW: 14.5 % (ref 11.5–15.5)
WBC: 11.9 K/uL — ABNORMAL HIGH (ref 4.0–10.5)
nRBC: 0 % (ref 0.0–0.2)

## 2024-10-19 LAB — GLUCOSE, CAPILLARY
Glucose-Capillary: 208 mg/dL — ABNORMAL HIGH (ref 70–99)
Glucose-Capillary: 278 mg/dL — ABNORMAL HIGH (ref 70–99)
Glucose-Capillary: 218 mg/dL — ABNORMAL HIGH (ref 70–99)
Glucose-Capillary: 148 mg/dL — ABNORMAL HIGH (ref 70–99)
Glucose-Capillary: 206 mg/dL — ABNORMAL HIGH (ref 70–99)

## 2024-10-19 MED ORDER — INSULIN ASPART 100 UNIT/ML IJ SOLN
0.0000 [IU] | Freq: Every day | INTRAMUSCULAR | Status: DC
  Administered 2024-10-19: 5 [IU] via SUBCUTANEOUS
  Administered 2024-10-20: 3 [IU] via SUBCUTANEOUS

## 2024-10-19 MED ORDER — INSULIN GLARGINE-YFGN 100 UNIT/ML ~~LOC~~ SOLN: 15.0000 [IU] | Freq: Every day | SUBCUTANEOUS | Status: DC

## 2024-10-19 MED ORDER — INSULIN GLARGINE-YFGN 100 UNIT/ML ~~LOC~~ SOLN
15.0000 [IU] | Freq: Every day | SUBCUTANEOUS | Status: DC
  Administered 2024-10-19: 15 [IU] via SUBCUTANEOUS
  Filled 2024-10-19 (×2): qty 0.15

## 2024-10-19 NOTE — Plan of Care (Signed)
  Problem: RH BOWEL ELIMINATION Goal: RH STG MANAGE BOWEL WITH ASSISTANCE Description: STG Manage Bowel with mod I Assistance. Outcome: Progressing   Problem: RH PAIN MANAGEMENT Goal: RH STG PAIN MANAGED AT OR BELOW PT'S PAIN GOAL Description: Pain < 4 with prns Outcome: Progressing   Problem: RH BLADDER ELIMINATION Goal: RH STG MANAGE BLADDER WITH ASSISTANCE Description: STG Manage Bladder With mod I Assistance Outcome: Progressing Goal: RH STG MANAGE BLADDER WITH MEDICATION WITH ASSISTANCE Description: STG Manage Bladder With Medication With mod I Assistance. Outcome: Progressing   Problem: RH SAFETY Goal: RH STG ADHERE TO SAFETY PRECAUTIONS W/ASSISTANCE/DEVICE Description: STG Adhere to Safety Precautions With cues Assistance/Device. Outcome: Progressing   Problem: RH KNOWLEDGE DEFICIT GENERAL Goal: RH STG INCREASE KNOWLEDGE OF SELF CARE AFTER HOSPITALIZATION Description: Patient and spouse will be able to manage care at discharge using educational resources for medication, skin care and dietary modification independently Outcome: Progressing   Problem: Consults Goal: RH GENERAL PATIENT EDUCATION Description: See Patient Education module for education specifics. Outcome: Progressing Goal: Diabetes Guidelines if Diabetic/Glucose > 140 Description: If diabetic or lab glucose is > 140 mg/dl - Initiate Diabetes/Hyperglycemia Guidelines & Document Interventions  Outcome: Progressing

## 2024-10-19 NOTE — Plan of Care (Signed)
  Problem: RH BOWEL ELIMINATION Goal: RH STG MANAGE BOWEL WITH ASSISTANCE Description: STG Manage Bowel with mod I Assistance. Outcome: Progressing   Problem: RH PAIN MANAGEMENT Goal: RH STG PAIN MANAGED AT OR BELOW PT'S PAIN GOAL Description: Pain < 4 with prns Outcome: Progressing   Problem: RH BLADDER ELIMINATION Goal: RH STG MANAGE BLADDER WITH ASSISTANCE Description: STG Manage Bladder With mod I Assistance Outcome: Progressing Goal: RH STG MANAGE BLADDER WITH MEDICATION WITH ASSISTANCE Description: STG Manage Bladder With Medication With mod I Assistance. Outcome: Progressing   Problem: RH SAFETY Goal: RH STG ADHERE TO SAFETY PRECAUTIONS W/ASSISTANCE/DEVICE Description: STG Adhere to Safety Precautions With cues Assistance/Device. Outcome: Progressing   Problem: RH KNOWLEDGE DEFICIT GENERAL Goal: RH STG INCREASE KNOWLEDGE OF SELF CARE AFTER HOSPITALIZATION Description: Patient and spouse will be able to manage care at discharge using educational resources for medication, skin care and dietary modification independently Outcome: Progressing   Problem: Education: Goal: Ability to describe self-care measures that may prevent or decrease complications (Diabetes Survival Skills Education) will improve Outcome: Progressing Goal: Individualized Educational Video(s) Outcome: Progressing   Problem: Coping: Goal: Ability to adjust to condition or change in health will improve Outcome: Progressing   Problem: Fluid Volume: Goal: Ability to maintain a balanced intake and output will improve Outcome: Progressing

## 2024-10-19 NOTE — Progress Notes (Signed)
 PMR Admission Coordinator Pre-Admission Assessment   Patient: Antonio Patterson is an 73 y.o., male MRN: [id number] DOB: June 25, 1951 Height: 5' 7 (170.2 cm) Weight: 86.2 kg   Insurance Information HMO: yes    PPO:      PCP:      IPA:      80/20:      OTHER:  PRIMARY: Medicare A and B      Policy#: [id number]    Subscriber: self CM Name:        Phone#:       Fax#:   Pre-Cert#:        Employer:  Works PRN, retired engineer, civil (consulting) Benefits:  Phone #:      Name: Checked in passport one source [employer]. Date: 05/26/2015 Deduct: $1676      Out of Pocket Max: none      Life Max: n/a CIR: 100%      SNF: 100 days Outpatient: 80%     Co-Pay: 20% Home Health: 100%      Co-Pay: none DME: 80%     Co-Pay: 20% Providers: patient's choice SECONDARY: [employer]      Policy#: [id number]      Phone#:    Financial Counselor:       Phone#:    The "Data Collection Information Summary" for patients in Inpatient Rehabilitation Facilities with attached "Privacy Act Statement-Health Care Records" was provided and verbally reviewed with: Pt   Emergency Contact Information Contact Information       Name Relation Home Work Mobile    Dewey Spouse     [phone]    Bullin,Michelle Daughter [phone]             Other Contacts   None on File        Current Medical History  Patient Admitting Diagnosis: Polytrauma s/p fall from ladder History of Present Illness: Pt is a 73 yo M with past medical history of CAD s/p CABG, DM, HLD, and HTN  who came to [facility] via EMS with chief complaints of right leg pain. Patient fell about 8 feet off of the ladder and landed on both of his feet. He was brought to ED at [facility] 10/13/24. Found to have R distal femur fracture with intraarticular extension, R tibial plateau fracture, and L calcaneus fracture. Left calcaneus could be treated nonoperatively and Pt s/p R femur ORIF 10/21. Pt. Now NWB on BLEs. Pt. Seen by PT/OT post operatively and they recommend CIR to  assist return to PLOF.   Patient's medical record from [facility] has been reviewed by the rehabilitation admission coordinator and physician.   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 Yes   Family History   family history includes Cancer  in his maternal grandmother and paternal grandfather; Heart disease in his father; Stroke in his maternal grandfather.   Current Medications  Current Medications    Current Facility-Administered Medications:    Chlorhexidine  Gluconate Cloth 2 % PADS 6 each, 6 each, Topical, Daily, Georgina Ozell LABOR, MD, 6 each at 10/17/24 0940   HYDROcodone-acetaminophen  (NORCO/VICODIN) 5-325 MG per tablet 1-2 tablet, 1-2 tablet, Oral, Q4H PRN, Georgina Ozell LABOR, MD, 1 tablet at 10/18/24 0402   insulin  aspart (novoLOG ) injection 0-15 Units, 0-15 Units, Subcutaneous, TID WC, Moore, Michael A, MD, 5 Units at 10/18/24 0700   insulin  aspart (novoLOG ) injection 0-5 Units, 0-5 Units, Subcutaneous, QHS, Moore, Michael A, MD, 2 Units at 10/16/24 2148   insulin  glargine-yfgn [facility]) injection 15 Units, 15 Units, Subcutaneous, Daily, Georgina Ozell LABOR, MD   losartan  (COZAAR ) tablet 100 mg, 100 mg, Oral, Daily, Moore, Michael A, MD, 100 mg at 10/17/24 9061   methocarbamol (ROBAXIN) tablet 500 mg, 500 mg, Oral, QID, Moore, Michael A, MD, 500 mg at  10/17/24 2143   metoprolol  succinate (TOPROL -XL) 24 hr tablet 50 mg, 50 mg, Oral, Daily, Moore, Michael A, MD, 50 mg at 10/17/24 9061   mupirocin ointment (BACTROBAN) 2 % 1 Application, 1 Application, Nasal, BID, Moore, Michael A, MD, 1 Application at 10/17/24 2143   ondansetron  (ZOFRAN ) tablet 4 mg, 4 mg, Oral, Q6H PRN **OR** ondansetron  (ZOFRAN ) injection 4 mg, 4 mg, Intravenous, Q6H PRN, Georgina Ozell LABOR, MD   pantoprazole  (PROTONIX ) EC tablet 40 mg, 40 mg, Oral, Daily, Moore, Michael A, MD, 40 mg at 10/17/24 9061   polyethylene glycol (MIRALAX / GLYCOLAX) packet 17 g, 17 g, Oral, Daily, Georgina Ozell LABOR, MD, 17 g at 10/16/24 0809   rivaroxaban (XARELTO) tablet 10 mg, 10 mg, Oral, Daily, Georgina Ozell LABOR, MD, 10 mg at 10/17/24 9060   senna-docusate (Senokot-S) tablet 1 tablet, 1 tablet, Oral, BID, Georgina Ozell LABOR, MD, 1 tablet at 10/17/24 9062   simvastatin  (ZOCOR ) tablet 40 mg, 40 mg, Oral, Daily, Moore, Michael A, MD, 40 mg at 10/17/24 9060   spironolactone  (ALDACTONE ) tablet 25 mg, 25 mg, Oral, Daily, Moore, Michael A, MD, 25 mg at 10/17/24 9060   tamsulosin  (FLOMAX ) capsule 0.4 mg, 0.4 mg, Oral, Daily, Georgina Ozell LABOR, MD, 0.4 mg at 10/17/24 9061   tranexamic acid  (CYKLOKAPRON ) IVPB 1,000 mg, 1,000 mg, Intravenous, Once, Georgina Ozell LABOR, MD     Patients Current Diet:  Diet Order                  Diet Carb Modified Fluid consistency: Thin  Diet effective now                         Precautions / Restrictions Precautions Precautions: Fall Restrictions Weight Bearing Restrictions Per Provider Order: Yes RLE Weight Bearing Per Provider Order: Non weight bearing LLE Weight Bearing Per Provider Order: Non weight bearing    Has the patient had 2 or more falls or a fall with injury in the past year? Yes   Prior Activity Level Community (5-7x/wk): Pt. working and active in the community PTA   Prior Functional Level Self Care: Did the patient need help bathing, dressing,  using the toilet or eating? Independent   Indoor Mobility: Did the patient need assistance with walking from room to room (with or without device)? Independent   Stairs: Did the patient need assistance with internal or external stairs (with or without device)? Independent  Functional Cognition: Did the patient need help planning regular tasks such as shopping or remembering to take medications? Independent   Patient Information Are you of Hispanic, Latino/a,or Spanish origin?: A. No, not of Hispanic, Latino/a, or Spanish origin What is your race?: A. White Do you need or want an interpreter to communicate with a doctor or health care staff?: 0. No   Patient's Response To:  Health Literacy and Transportation Is the patient able to respond to health literacy and transportation needs?: Yes Health Literacy - How often do you need to have someone help you when you read instructions, pamphlets, or other written material from your doctor or pharmacy?: Never In the past 12 months, has lack of transportation kept you from medical appointments or from getting medications?: No In the past 12 months, has lack of transportation kept you from meetings, work, or from getting things needed for daily living?: No   Home Assistive Devices / Equipment Home Equipment: Grab bars - tub/shower, Hand held shower head, Cane - single point, BSC/3in1, Agricultural Consultant (2 wheels)   Prior Device Use: Indicate devices/aids used by the patient prior to current illness, exacerbation or injury? None of the above   Current Functional Level Cognition   Orientation Level: Oriented X4    Extremity Assessment (includes Sensation/Coordination)   Upper Extremity Assessment: Generalized weakness, Overall WFL for tasks assessed (but needs to strengthen and maintain strength for all his mobility and transfers due to NWBing Bil LEs)  Lower Extremity Assessment: RLE deficits/detail, LLE deficits/detail RLE Deficits / Details: Pt  POD 1 s/p femur ORIF. Decreased hip and knee AROM. Grossly 3-/5 strength. RLE Sensation: WNL RLE Coordination: WNL LLE Deficits / Details: Pt is in a short leg splint spanning the ankle joint. Hip and knee AROM WFL. Grossly 3/5 strength. LLE: Unable to fully assess due to immobilization LLE Sensation: WNL LLE Coordination: WNL     ADLs   Overall ADL's : Needs assistance/impaired Eating/Feeding: Independent, Sitting Grooming: Set up, Sitting Upper Body Bathing: Set up, Sitting Lower Body Bathing: Moderate assistance, Bed level Upper Body Dressing : Set up, Sitting Lower Body Dressing: Total assistance, Sitting/lateral leans Toilet Transfer: Minimal assistance, Transfer board Toilet Transfer Details (indicate cue type and reason): Drop arm BSC with transfer board; pt Min A to use board to scoot to left with underwear on, total A to get underwear down with pt alternatingly lifting legs, pt max A +2 to scoot back with pillow case on transfer board due to no underwear on (no extra ones to put on)--A to scoot and for RLE. Toileting- Clothing Manipulation and Hygiene: Moderate assistance Toileting - Clothing Manipulation Details (indicate cue type and reason): total A for back peri care General ADL Comments: Back in to give pt theraband handout that does not require anchoring with one of his feet and dtr was there. Entered into another discussion with pt, dtr, wife and dtr (as well as wife) are really concerned about pt going home with the assist he currently needs. +2 A for some transfers, A for RLE, A for ADLs, and for getting into a car. Pt now agreeable to AIR consult.     Mobility   Overal bed mobility: Needs Assistance Bed Mobility: Supine to Sit, Sit to Supine Supine to sit: Min assist, Used rails Sit to supine: Min assist, Used rails General bed mobility comments: Pt sat up on L side of bed with increased time. Cues for sequencing. Assist to manage RLE. Pt pulled up on bed  rail and placed  BUE behind him to help elevate trunk. Pt able to scoot fwd to EOB with RLE supported.     Transfers   Overall transfer level: Needs assistance Equipment used: Sliding board Transfers: Bed to chair/wheelchair/BSC Bed to/from chair/wheelchair/BSC transfer type:: Lateral/scoot transfer  Lateral/Scoot Transfers: Contact guard assist, Min assist, +2 safety/equipment, With slide board General transfer comment: Pt completed bed<>BSC transfer initially going to the left leading with LLE and returning to bed on the right placed at a slight incline (3) leading with RLE. Cues for sequencing. He verbalizes head-hips relationship but was leaning more laterally than anterior. Instructed pt to increase fwd flex over knee opposite the direction he wants to go. He required assist to place and align sliding board appropriate. Mobility Specialists managed RLE providing support throughout transfer d/t pt's limitation by pain and limiting any weight-acceptance in hip. Pt was able to slowly lift, shift, and lower. Support at trunk for stability. Increased assist for incline. Pt completed bed<>w/c transfer going to the left leading with LLE. Flipped w/c around in order to go back to bed. Pt reported disliking this technique as he has to lean to the right more in order to get sliding board placed. As he fatigued pt required increased assist. Cues for safety awareness throughout including hand placement on sliding board.     Ambulation / Gait / Stairs / Wheelchair Mobility   Ambulation/Gait General Gait Details: Contraindicated, pt is NWB on BLE. Naval Architect mobility: Yes Wheelchair propulsion: Both upper extremities Wheelchair parts: Needs Engineer, Materials Details (indicate cue type and reason): Pt declined further w/c propulsion tranining reporting he mobilized himself earlier with family assistance in hallway after OT session and feels confident navigating the w/c. Educated pt/family  on w/c parts and features demonstrating how to use and remove the various components.     Posture / Balance Dynamic Sitting Balance Sitting balance - Comments: Pt sat EOB statically with supervision and dynamically transferred with CGA Balance Overall balance assessment: Needs assistance Sitting-balance support: No upper extremity supported, Feet supported Sitting balance-Leahy Scale: Good Sitting balance - Comments: Pt sat EOB statically with supervision and dynamically transferred with CGA     Special considerations/life events  Special service needs NWB on BLEs    Previous Home Environment (from acute therapy documentation) Living Arrangements: Spouse/significant other  Lives With: Spouse Available Help at Discharge: Family, Available 24 hours/day Type of Home: House Home Layout: Two level, Able to live on main level with bedroom/bathroom, Full bath on main level Home Access: Stairs to enter Entrance Stairs-Rails: Right, Left Entrance Stairs-Number of Steps: 3 Bathroom Shower/Tub: Psychologist, counselling, Engineer, Manufacturing Systems: Pharmacist, Community: Yes Home Care Services: No   Discharge Living Setting Plans for Discharge Living Setting: Patient's home Type of Home at Discharge: House Discharge Home Layout: One level Discharge Home Access: Ramped entrance Discharge Bathroom Shower/Tub: Walk-in shower, Tub/shower unit Discharge Bathroom Toilet: Standard Discharge Bathroom Accessibility: Yes How Accessible: Accessible via walker Does the patient have any problems obtaining your medications?: No   Social/Family/Support Systems Patient Roles: Spouse Contact Information: [phone] Anticipated Caregiver: Antonio Patterson Anticipated Caregiver's Contact Information: Pt. wife can do light min A, but if he needs more, can go to daughter's home Caregiver Availability: 24/7 Discharge Plan Discussed with Primary Caregiver: Yes Is Caregiver In Agreement with Plan?:  Yes Does Caregiver/Family have Issues with Lodging/Transportation while Pt is in Rehab?: No   Goals Patient/Family Goal for Rehab: PT/OT Mod I wheelchair level  Expected length of stay: 7-10 days Pt/Family Agrees to Admission and willing to participate: Yes Program Orientation Provided & Reviewed with Pt/Caregiver Including Roles  & Responsibilities: Yes   Decrease burden of Care through IP rehab admission: not anticipated   Possible need for SNF placement upon discharge: not anticipated    Patient Condition: I have reviewed medical records from [facility] , spoken with CM, and patient, spouse, and daughter. I met with patient at the bedside for inpatient rehabilitation assessment.  Patient will benefit from ongoing PT and OT, can actively participate in 3 hours of therapy a day 5 days of the week, and can make measurable gains during the admission.  Patient will also benefit from the coordinated team approach during an Inpatient Acute Rehabilitation admission.  The patient will receive intensive therapy as well as Rehabilitation physician, nursing, social worker, and care management interventions.  Due to safety, skin/wound care, disease management, medication administration, pain management, and patient education the patient requires 24 hour a day rehabilitation nursing.  The patient is currently min A with mobility and basic ADLs.  Discharge setting and therapy post discharge at home with home health is anticipated.  Patient has agreed to participate in the Acute Inpatient Rehabilitation Program and will admit today.   Preadmission Screen Completed By:  Leita KATHEE Kleine, 10/18/2024 9:34 AM ______________________________________________________________________   Discussed status with Dr. Emeline  on 10/18/24 at 941 and received approval for admission today.   Admission Coordinator:  Leita KATHEE Kleine, CCC-SLP, time 941/Date 10/18/24    Assessment/Plan: Diagnosis: Debility due to R  femur and L calcaneal fractures Does the need for close, 24 hr/day Medical supervision in concert with the patient's rehab needs make it unreasonable for this patient to be served in a less intensive setting? Yes Co-Morbidities requiring supervision/potential complications: DM with hyperglycemia, poor pain control, constipation, and leukocytosis Due to bowel management, safety, skin/wound care, disease management, medication administration, pain management, and patient education, does the patient require 24 hr/day rehab nursing? Yes Does the patient require coordinated care of a physician, rehab nurse, PT, OT to address physical and functional deficits in the context of the above medical diagnosis(es)? Yes Addressing deficits in the following areas: balance, endurance, locomotion, strength, transferring, bowel/bladder control, bathing, dressing, grooming, and toileting Can the patient actively participate in an intensive therapy program of at least 3 hrs of therapy 5 days a week? Yes The potential for patient to make measurable gains while on inpatient rehab is good Anticipated functional outcomes upon discharge from inpatient rehab: modified independent PT, modified independent OT Estimated rehab length of stay to reach the above functional goals is: 7-10 days Anticipated discharge destination: Home 10. Overall Rehab/Functional Prognosis: EXCELLENT     MD Signature:   Joesph JAYSON Emeline, DO 10/18/2024

## 2024-10-19 NOTE — Evaluation (Signed)
 Occupational Therapy Assessment and Plan  Patient Details  Name: Antonio Patterson MRN: [id number] Date of Birth: September 27, 1951  OT Diagnosis: muscle weakness (generalized) and pain in joint Rehab Potential: Rehab Potential (ACUTE ONLY): Good ELOS: 7-10 days   Today's Date: 10/19/2024 OT Individual Time: 0100-0215 OT Individual Time Calculation (min): 75 min     Hospital Problem: Principal Problem:   Other fracture of right femur, initial encounter for closed fracture [facility])   Past Medical History:  Past Medical History:  Diagnosis Date   ALLERGIC RHINITIS 08/10/2007   BLADDER OUTLET OBSTRUCTION 11/13/2008   Coronary artery disease    DIABETES MELLITUS, TYPE II 08/10/2007   Dyspnea    with exertion   ERECTILE DYSFUNCTION 11/13/2008   History of hiatal hernia    History of kidney stones    HYPERLIPIDEMIA 08/10/2007   HYPERTENSION 08/10/2007   PERS HX NONCOMPLIANCE W/MED TX PRS HAZARDS HLTH 11/13/2008   Seborrhea 11/13/2008   Sleep apnea    TINNITUS, CHRONIC, 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   UNSPECIFIED PERIPHERAL VASCULAR DISEASE 11/13/2008   Past Surgical History:  Past Surgical History:  Procedure Laterality Date   CORONARY ARTERY BYPASS GRAFT N/A 08/17/2022   Procedure: CORONARY ARTERY BYPASS GRAFTING (CABG) X3 BYPASSES USING OPEN LEFT INTERNAL MAMMARY ARTERY AND ENDOSCOPIC RIGHT GREATER SAPHENOUS VEIN HARVEST.;  Surgeon: Shyrl Linnie KIDD, MD;  Location: [facility] OR;  Service: Open Heart Surgery;  Laterality: N/A;   CORONARY PRESSURE/FFR STUDY N/A 07/14/2022   Procedure: INTRAVASCULAR PRESSURE WIRE/FFR STUDY;  Surgeon: Mady Bruckner, MD;  Location: [facility] INVASIVE CV LAB;  Service: Cardiovascular;  Laterality: N/A;   RIGHT/LEFT HEART CATH AND CORONARY ANGIOGRAPHY N/A 07/14/2022   Procedure: RIGHT/LEFT HEART CATH AND CORONARY ANGIOGRAPHY;  Surgeon: Mady Bruckner, MD;  Location: [facility] INVASIVE CV LAB;  Service: Cardiovascular;  Laterality: N/A;   TEE WITHOUT CARDIOVERSION N/A 08/17/2022    Procedure: TRANSESOPHAGEAL ECHOCARDIOGRAM (TEE);  Surgeon: Shyrl Linnie KIDD, MD;  Location: [facility] OR;  Service: Open Heart Surgery;  Laterality: N/A;   TURBINATE REDUCTION Bilateral 2017   VASECTOMY  1976    Assessment & Plan Clinical Impression: Antonio Patterson is a 73 y.o. 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  He presented to the [facility] health ER on 10-20 after falling off of a ladder.  He immediately felt pain in his right thigh, knee, and left heel.  ER workup significant for right femur, right tibial plateau, and left calcaneal fractures.  He was taken to the OR by Dr. Georgina who performed right femur ORIF.  Tibial plateau fracture and calcaneal fracture were managed nonoperatively, and patient was made nonweightbearing bilaterally.  Postop course was complicated by hyperglycemia secondary to diabetes, postop anemia, and poor pain control.  He was evaluated by PT/OT and determined appropriate for inpatient rehab admission on 10 - 25 - 25.   Patient transferred to CIR on 10/18/2024 .    Patient currently requires mod  to MaxA with basic self-care skills secondary to muscle weakness.  Prior to hospitalization, patient could complete BADL's with independent .  Patient will benefit from skilled intervention to decrease level of assist with basic self-care skills prior to discharge home  with care partner.  Anticipate patient will require 24 hour supervision and TBD.  OT - End of Session Activity Tolerance:  Tolerates 30+ min activity with multiple rests Endurance Deficit: Yes Endurance Deficit Description: required frequent rest break and presented with high pain response OT Assessment Rehab Potential (ACUTE ONLY): Good OT Patient demonstrates impairments in the following area(s): Balance;Endurance;Motor;Safety;Pain OT Basic ADL's Functional Problem(s): Bathing;Dressing;Toileting OT Transfers Functional Problem(s): Tub/Shower;Toilet (To all surfaces) OT Plan OT Intensity: Minimum of 1-2 x/day, 45 to 90 minutes OT Frequency: 5 out of 7 days OT Duration/Estimated Length of Stay: 7-10 days OT Treatment/Interventions: Balance/vestibular training;Self Care/advanced ADL retraining;Therapeutic Exercise;Wheelchair propulsion/positioning;DME/adaptive equipment instruction;Pain management;UE/LE Strength taining/ROM;Therapeutic Activities;Patient/family education OT Self Feeding Anticipated Outcome(s): ModI OT Basic Self-Care Anticipated Outcome(s): ModI OT Toileting Anticipated Outcome(s): ModI OT Bathroom Transfers Anticipated Outcome(s): ModI OT Recommendation Recommendations for Other Services: None Patient destination: Home Follow Up Recommendations: Other (comment) (TBD) Equipment Recommended: Sliding board;To be determined;3 in 1 bedside comode   OT Evaluation Precautions/Restrictions  Precautions Precautions: Fall Recall of Precautions/Restrictions: Intact Precaution/Restrictions Comments: NWB BLES Required Braces or Orthoses: Splint/Cast Splint/Cast: Short leg spint LLE Restrictions Weight Bearing Restrictions Per Provider Order: Yes RLE Weight Bearing Per Provider Order: Non weight bearing LLE Weight Bearing Per Provider Order: Non weight bearing General Chart Reviewed: Yes Family/Caregiver Present: Yes Vital Signs Therapy Vitals Pulse Rate: 73 BP: (!) 109/59 Patient Position (if appropriate): Lying Oxygen Therapy SpO2: 97 % O2 Device: Room Air Patient Activity (if  Appropriate): In bed Pain Pain Assessment Pain Scale: 0-10 Pain Score: 0-No pain Home Living/Prior Functioning Home Living Family/patient expects to be discharged to:: Private residence Living Arrangements: Spouse/significant other, Children Available Help at Discharge: Family, Available 24 hours/day Type of Home: House Home Access: Stairs to enter (3 stairs to the entrance) Entrance Stairs-Number of Steps: 3 to the entrance with landing and 6 inch rise into the main level Home Layout: Two level, Able to live on main level with bedroom/bathroom, Full bath on main level Alternate Level Stairs-Rails: Right, Left Bathroom Shower/Tub: Psychologist, counselling, Associate Professor: Yes  Lives With: Spouse IADL History Homemaking Responsibilities: No Current License: Yes Mode of Transportation: Set Designer Occupation: Retired 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 Hobbies: fishing Prior Function Level of Independence: Independent with transfers, Independent with gait  Able to Take Stairs?: Yes Driving: Yes Vision Baseline Vision/History: 1 Wears glasses (has contacts) Patient Visual Report: No change from baseline Vision Assessment?: Wears glasses for driving (wears contacts) Perception  Perception: Within Functional Limits Praxis   Cognition Cognition Overall Cognitive Status: Within Functional Limits for tasks assessed Arousal/Alertness: Awake/alert Orientation Level: Person;Place;Situation Person: Oriented Place: Oriented Situation: Oriented Memory: Appears intact Awareness: Appears intact Problem Solving: Appears intact Safety/Judgment: Appears intact Brief Interview for Mental Status (BIMS) Repetition of Three Words (First Attempt): 3 Temporal Orientation: Year: Correct Temporal Orientation: Month: Accurate within 5 days Temporal Orientation: Day: Correct Recall: Sock: Yes, no cue required Recall: Blue:  Yes, no cue required Recall: Bed: Yes, no cue required BIMS Summary Score: 15 Sensation Sensation Light Touch: Appears Intact Coordination Gross Motor Movements are Fluid and Coordinated: No (secondary to most recent episode) Fine Motor Movements are Fluid and Coordinated: Yes Motor  Motor Motor: Other (comment) Motor - Skilled Clinical Observations: weakness BLES R > L  Trunk/Postural Assessment  Cervical Assessment Cervical Assessment: Within Functional Limits Thoracic Assessment Thoracic Assessment: Exceptions to [facility] Postural Control Postural Control: Deficits on evaluation  Balance Balance Balance Assessed: Yes (  patient able to maintain fair + static sit balance for short periods of time) Static Sitting Balance Static Sitting - Balance Support: Right upper extremity supported;Left upper extremity supported;No upper extremity supported Static Sitting - Level of Assistance: 5: Stand by assistance Dynamic Sitting Balance Dynamic Sitting - Balance Support: Left upper extremity supported (on occasion) Dynamic Sitting - Level of Assistance: 4: Min assist (incorporating the bed rails at times) Sitting balance - Comments: Patient was able to sit EOB for donning theraband over head Extremity/Trunk Assessment RUE Assessment RUE Assessment: Within Functional Limits Passive Range of Motion (PROM) Comments: WFL Active Range of Motion (AROM) Comments: WFL General Strength Comments: 4/5MMT LUE Assessment Passive Range of Motion (PROM) Comments: WFL Active Range of Motion (AROM) Comments: WFL General Strength Comments: 4/5 MMT  Care Tool Care Tool Self Care Eating   Eating Assist Level: Set up assist    Oral Care    Oral Care Assist Level: Set up assist    Bathing   Body parts bathed by patient: Right arm;Left arm;Chest;Abdomen;Front perineal area;Right upper leg;Left upper leg;Face   Body parts n/a: Buttocks;Right lower leg;Left lower leg Assist Level: Moderate Assistance  - Patient 50 - 74%    Upper Body Dressing(including orthotics)   What is the patient wearing?: Pull over shirt   Assist Level: Set up assist    Lower Body Dressing (excluding footwear)   What is the patient wearing?: Underwear/pull up;Pants Assist for lower body dressing: Maximal Assistance - Patient 25 - 49% (Mod to MaxA for threading leg through the opening)    Putting on/Taking off footwear     Assist for footwear: Maximal Assistance - Patient 25 - 49%       Care Tool Toileting Toileting activity   Assist for toileting: Maximal Assistance - Patient 25 - 49% (MaxA to Dep)     Care Tool Bed Mobility Roll left and right activity   Roll left and right assist level: Moderate Assistance - Patient 50 - 74% (Max to Mod for going to the R secondary to pain)    Sit to lying activity   Sit to lying assist level: Moderate Assistance - Patient 50 - 74%    Lying to sitting on side of bed activity   Lying to sitting on side of bed assist level: the ability to move from lying on the back to sitting on the side of the bed with no back support.: Moderate Assistance - Patient 50 - 74%     Care Tool Transfers Sit to stand transfer Sit to stand activity did not occur: N/A Sit to stand assist level:  (patient NWB for BLE)    Chair/bed transfer   Chair/bed transfer assist level: Moderate Assistance - Patient 50 - 74% (using SBT per chart and staff report)     Toilet transfer   Assist Level: Moderate Assistance - Patient 50 - 74% (based on functional status during bed to chair per chart review and staff report)     Care Tool Cognition  Expression of Ideas and Wants    Understanding Verbal and Non-Verbal Content Understanding Verbal and Non-Verbal Content: 4. Understands (complex and basic) - clear comprehension without cues or repetitions   Memory/Recall Ability Memory/Recall Ability : Current season;That he or she is in a hospital/hospital unit   Refer to Care Plan for Long Term  Goals  SHORT TERM GOAL WEEK 1 OT Short Term Goal 1 (Week 1): The pt will safely transfer from w/c  LOF  to all  surfaces with ModI using SB OT Short Term Goal 2 (Week 1): The pt will safely complete LB bathing/  dressing with ModI incorporting AE as needed. OT Short Term Goal 3 (Week 1): The pt will tolerate >30 minutes of therapeutic activity requiring <  3 rest breaks . OT Short Term Goal 4 (Week 1): The pt will safely complete toileting hygiene with ModI incorporating AE as needed.  Recommendations for other services: None    Skilled Therapeutic Intervention  Patient seen this day for skilled Occupational Therapy evaluation to address residual deficits following most recent episode.  The pt presents with fractures of the right femur, right tibial plateau, and left calcaneal secondary to a fall. The pt is currently NWB with BLE and presents as Mod to MaxA  for functional task performance with BADL related task in LB bathing and dressing.  The  pt completes functional transfers using a SB at [facility] to [facility] for managing BLE. The pt is MaxA with toileting hygiene. The pt presents as very motivation towards goal attainment and is eager to participate.  Based on the patient level of motivation, his current status, and collaboration with other disciplines, I recommend 7-10 days of Occupational Therapy  to address his residual challenges with modification in the POC as needed. Additional service to be determined prior to d/c.   ADL ADL Equipment Provided: Reacher Eating: Set up (based on current functional status) Where Assessed-Eating: Bed level Grooming: Setup Where Assessed-Grooming: Bed level Upper Body Bathing: Setup (Simulated) Where Assessed-Upper Body Bathing: Bed level Lower Body Bathing: Maximal assistance;Moderate assistance (BLE secondary to current restructions) Where Assessed-Lower Body Bathing: Bed level Upper Body Dressing: Setup Where Assessed-Upper Body Dressing: Bed level  (simulated) Lower Body Dressing: Maximal assistance;Moderate assistance Where Assessed-Lower Body Dressing: Bed level Toileting: Maximal assistance;Dependent (based on LB task performance at bed LOF) Where Assessed-Toileting: Bed level Toilet Transfer: Minimal assistance;Moderate assistance (Using SB transfer) Toilet Transfer Method: Other (comment) (based on observation with LB task performance) Toilet Transfer Equipment: Grab bars;Other (comment) (SB transfer) Tub/Shower Transfer: Minimal assistance;Moderate assistance Tub/Shower Transfer Method:  (SB transfer) Tub/Shower Equipment: Shower seat with back (not able to shower at this time) Film/video Editor Method: Best Boy: Emergency planning/management officer;Shower seat with back Mobility  Bed Mobility Bed Mobility: Rolling Right;Rolling Left;Supine to Sit;Sit to Supine Rolling Right: Moderate Assistance - Patient 50-74%;Maximal Assistance - Patient 25-49% (secodary to pain response) Rolling Left: Moderate Assistance - Patient 50-74%;Minimal Assistance - Patient > 75% Supine to Sit: Moderate Assistance - Patient 50-74% (secondary to pain response) Sit to Supine: Moderate Assistance - Patient 50-74%   Discharge Criteria: Patient will be discharged from OT if patient refuses treatment 3 consecutive times without medical reason, if treatment goals not met, if there is a change in medical status, if patient makes no progress towards goals or if patient is discharged from hospital.  The above assessment, treatment plan, treatment alternatives and goals were discussed and mutually agreed upon: by patient and by family  Elvera JONETTA Mace 10/19/2024, 5:43 PM

## 2024-10-19 NOTE — Evaluation (Addendum)
 Physical Therapy Assessment and Plan  Patient Details  Name: Antonio Patterson MRN: [id number] Date of Birth: May 30, 1951  PT Diagnosis: Abnormal posture, Muscle weakness, and Pain in R LE Rehab Potential: Good ELOS: 7-10 days.   Today's Date: 10/19/2024 PT Individual Time: 8995-8954 and 8995-8954 PT Individual Time Calculation (min): 41 min  and 41 min.  Hospital Problem: Principal Problem:   Other fracture of right femur, initial encounter for closed fracture [facility])   Past Medical History:  Past Medical History:  Diagnosis Date   ALLERGIC RHINITIS 08/10/2007   BLADDER OUTLET OBSTRUCTION 11/13/2008   Coronary artery disease    DIABETES MELLITUS, TYPE II 08/10/2007   Dyspnea    with exertion   ERECTILE DYSFUNCTION 11/13/2008   History of hiatal hernia    History of kidney stones    HYPERLIPIDEMIA 08/10/2007   HYPERTENSION 08/10/2007   PERS HX NONCOMPLIANCE W/MED TX PRS HAZARDS HLTH 11/13/2008   Seborrhea 11/13/2008   Sleep apnea    TINNITUS, CHRONIC, 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   UNSPECIFIED PERIPHERAL VASCULAR DISEASE 11/13/2008   Past Surgical History:  Past Surgical History:  Procedure Laterality Date   CORONARY ARTERY BYPASS GRAFT N/A 08/17/2022   Procedure: CORONARY ARTERY BYPASS GRAFTING (CABG) X3 BYPASSES USING OPEN LEFT INTERNAL MAMMARY ARTERY AND ENDOSCOPIC RIGHT GREATER SAPHENOUS VEIN HARVEST.;  Surgeon: Shyrl Linnie KIDD, MD;  Location: [facility] OR;  Service: Open Heart Surgery;  Laterality: N/A;   CORONARY PRESSURE/FFR STUDY N/A 07/14/2022   Procedure: INTRAVASCULAR PRESSURE WIRE/FFR STUDY;  Surgeon: Mady Bruckner, MD;  Location: [facility] INVASIVE CV LAB;  Service: Cardiovascular;  Laterality: N/A;   RIGHT/LEFT HEART CATH AND CORONARY ANGIOGRAPHY N/A 07/14/2022   Procedure: RIGHT/LEFT HEART CATH AND CORONARY ANGIOGRAPHY;  Surgeon: Mady Bruckner, MD;  Location: [facility] INVASIVE CV LAB;  Service: Cardiovascular;  Laterality: N/A;   TEE WITHOUT CARDIOVERSION N/A 08/17/2022    Procedure: TRANSESOPHAGEAL ECHOCARDIOGRAM (TEE);  Surgeon: Shyrl Linnie KIDD, MD;  Location: [facility] OR;  Service: Open Heart Surgery;  Laterality: N/A;   TURBINATE REDUCTION Bilateral 2017   VASECTOMY  1976    Assessment & Plan Clinical Impression: Antonio Patterson is a 73 y.o. 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  He presented to the [facility] health ER on 10-20 after falling off of a ladder.  He immediately felt pain in his right thigh, knee, and left heel.  ER workup significant for right femur, right tibial plateau, and left calcaneal fractures.  He was taken to the OR by Dr. Georgina who performed right femur ORIF.  Tibial plateau fracture and calcaneal fracture were managed nonoperatively, and patient was made nonweightbearing bilaterally.  Postop course was complicated by hyperglycemia secondary to diabetes, postop anemia, and poor pain control.  He was evaluated by PT/OT and determined appropriate for inpatient rehab admission on 10 - 25 - 25.   Patient currently requires mod with mobility secondary to muscle weakness.  Prior to hospitalization, patient was independent  with mobility and lived with Spouse in a House home.  Home access is 3Stairs to enter.  Patient will benefit from skilled PT intervention to maximize safe functional mobility, minimize fall risk, and decrease caregiver burden  for planned discharge home with 24 hour supervision.  Anticipate patient will benefit from follow up HH at discharge.  PT  - End of Session Activity Tolerance: Tolerates 30+ min activity with multiple rests Endurance Deficit: Yes PT Assessment Rehab Potential (ACUTE/IP ONLY): Good PT Barriers to Discharge: Inaccessible home environment;Decreased caregiver support;Weight bearing restrictions PT Barriers to Discharge Comments: NWB BLES PT Patient demonstrates impairments in the following area(s): Balance;Safety;Endurance;Motor;Pain PT Transfers Functional Problem(s): Bed Mobility;Bed to Chair;Car;Furniture PT Locomotion Functional Problem(s): Wheelchair Mobility;Other (comment) (ramp being built at home although per pt, will not be long enough for ADA compliance.) PT Plan PT Intensity: Minimum of 1-2 x/day ,45 to 90 minutes PT Frequency: 5 out of 7 days PT Duration Estimated Length of Stay: 7-10 days. PT Treatment/Interventions: Community reintegration;Neuromuscular re-education;UE/LE Strength taining/ROM;Wheelchair propulsion/positioning;Therapeutic Activities;UE/LE Coordination activities;Discharge planning;Pain management;Patient/family education;Functional mobility training;Therapeutic Exercise PT Transfers Anticipated Outcome(s): supervision for transfers w/ LRAD (SB vs no device). PT Locomotion Anticipated Outcome(s): supervision for w/c mobility/management. PT Recommendation Follow Up Recommendations: Home health PT Patient destination: Home Equipment Recommended: Wheelchair cushion (measurements);Wheelchair (measurements) Equipment Details: pt in 18/16 w/c w/ Roho cushion, may need appropriate cushion   PT Evaluation Precautions/Restrictions Precautions Precautions: Fall Precaution/Restrictions Comments: NWB BLES Required Braces or Orthoses: Splint/Cast Splint/Cast: Short leg spint LLE Restrictions Weight Bearing Restrictions Per Provider Order: Yes RLE Weight Bearing Per Provider Order: Non weight bearing LLE Weight Bearing Per Provider Order: Non weight bearing General Chart Reviewed:  Yes Family/Caregiver Present: Yes Vital SignsTherapy Vitals Pulse Rate: 76 BP: 103/69 Pain Pain Assessment Pain Scale: 0-10 Pain Score: 7  Pain Type: Surgical pain Pain Location: Leg Pain Orientation: Right Pain Descriptors / Indicators: Sharp (w/ activity) Pain Onset: On-going Pain Intervention(s): Medication (See eMAR) Pain Interference Pain Interference Pain Effect on Sleep: 1. Rarely or not at all Pain Interference with Therapy Activities: 1. Rarely or not at all;2. Occasionally Pain Interference with Day-to-Day Activities: 1. Rarely or not at all Home Living/Prior Functioning Home Living Available Help at Discharge: Family;Available 24 hours/day Type of Home: House Home Access: Stairs to enter [employer] of Steps: 3 Bathroom Shower/Tub: Walk-in shower;Tub/shower unit  Lives With: Spouse Prior Function Level of Independence: Independent with transfers;Independent with gait  Able to Take Stairs?: Yes Driving: Yes Vision/Perception     Cognition Overall Cognitive Status: Within Functional Limits for tasks assessed Orientation Level: Oriented X4 Awareness: Appears intact Problem Solving: Appears intact Safety/Judgment: Appears intact Sensation Sensation Light Touch: Appears Intact Coordination Gross Motor Movements are Fluid and Coordinated: No Motor  Motor Motor - Skilled Clinical Observations: weakness BLES R > L   Trunk/Postural Assessment  Cervical Assessment Cervical Assessment: Within Functional Limits Thoracic Assessment Thoracic Assessment: Exceptions to [facility] (rounded shoulders) Lumbar Assessment Lumbar Assessment:  (lean to R 2/2 pain RLE.) Postural Control Postural Control: Deficits on evaluation (delayed.)  Balance Balance Balance Assessed: Yes Static Sitting Balance Static Sitting - Balance Support: Right upper extremity supported;Left upper extremity supported;No upper extremity supported Static Sitting - Level of Assistance: 5:  Stand by assistance Dynamic Sitting Balance Dynamic Sitting - Level of Assistance: 4: Min assist Extremity Assessment      RLE Assessment RLE Assessment: Exceptions to [facility] RLE Strength RLE Overall Strength: Due to pain RLE Overall Strength Comments: 3-/5 LLE Assessment LLE Assessment: Exceptions to [facility] General Strength Comments: 3/5 grossly,  Care Tool Care Tool Bed Mobility Roll left and right activity   Roll left and right assist level: Moderate Assistance - Patient 50 - 74%  Sit to lying activity   Sit to lying assist level: Moderate Assistance - Patient 50 - 74%    Lying to sitting on side of bed activity   Lying to sitting on side of bed assist level: the ability to move from lying on the back to sitting on the side of the bed with no back support.: Moderate Assistance - Patient 50 - 74%     Care Tool Transfers Sit to stand transfer Sit to stand activity did not occur: N/A      Chair/bed transfer   Chair/bed transfer assist level: Moderate Assistance - Patient 50 - 74%    Car transfer Car transfer activity did not occur: Safety/medical concerns        Care Tool Locomotion Ambulation Ambulation activity did not occur: N/A        Walk 10 feet activity Walk 10 feet activity did not occur: N/A       Walk 50 feet with 2 turns activity Walk 50 feet with 2 turns activity did not occur: N/A      Walk 150 feet activity Walk 150 feet activity did not occur: N/A      Walk 10 feet on uneven surfaces activity Walk 10 feet on uneven surfaces activity did not occur: N/A      Stairs Stair activity did not occur: N/A        Walk up/down 1 step activity Walk up/down 1 step or curb (drop down) activity did not occur: N/A      Walk up/down 4 steps activity Walk up/down 4 steps activity did not occur: N/A      Walk up/down 12 steps activity Walk up/down 12 steps activity did not occur: N/A      Pick up small objects from floor Pick up small object from the floor  (from standing position) activity did not occur: N/A      Wheelchair Is the patient using a wheelchair?: Yes Type of Wheelchair: Manual   Wheelchair assist level: Supervision/Verbal cueing Max wheelchair distance: 150+  Wheel 50 feet with 2 turns activity   Assist Level: Supervision/Verbal cueing  Wheel 150 feet activity   Assist Level: Supervision/Verbal cueing    Refer to Care Plan for Long Term Goals  SHORT TERM GOAL WEEK 1 PT Short Term Goal 1 (Week 1): STG+LTG 2/2 ELOS.  Recommendations for other services: None   Skilled Therapeutic Intervention Evaluation completed (see details above and below) with education on PT POC and goals and individual treatment initiated with focus on transfers, strengthening, w/c mobility, family ed.  Pt presents supine in bed and agreeable to therapy.  Pt states 0/10 pain w/o activity, but did increase during session to 9/10 almost more than I could take.  Pt required A to thread shorts over feet but pt then could pull up to buttocks and attempted to lift hips side to side to complete but required mod A to roll side to side for PT to complete.  Pt rolls to R > L.  Pt transfers sup to sit w/ light mod A.  Pt doffs gown and dons pull-over shirt w/ set-up.  Pt able to lean to right slightly to assist w/ placement of SB and then performs SB transfer w/ light mod A and cues for forward lean into w/c.  PT assisting w/ RLE.  Pt wheeled 150+ w/ BUES w/ supervision, cues for propulsion.  Pt returned to room and remained sitting in w/c, handed off to nurse.  Second session:  Pt presents sitting in w/c w/ ice pack to R thigh and agreeable to therapy.  Pt negotiated w/c to small gym for negotiation of ramp w/ light mod A ascending and CGA and cues for descending.  Pt states that ramp purchased is 10' and PT states that should be 16-20' for compliance.  Discussed options for car transfers w/ mini-van, height provided by daughter.  Pt performed UBE at Level 1 for 5'  forward and 5' backwards w/o rest break.  Pt tolerated x 187 rpm and averaged 1.7 METS. Pt performed w/o trunk support.  Pt returned to room and performed SB transfer w/c > bed w/ mod A and cues for safety, maintaining support of RLE.  Pt required mod A for sit to supine transfer.  Pt scooted self to [facility]  Bed alarm on and all needs in reach.  Ice pack resumed to R thigh.  Spouse present in room Mobility Bed Mobility Bed Mobility: Rolling Right;Rolling Left;Supine to Sit;Sit to Supine Rolling Right: Moderate Assistance - Patient 50-74% Rolling Left: Moderate Assistance - Patient 50-74% Supine to Sit: Moderate Assistance - Patient 50-74% Sit to Supine: Minimal Assistance - Patient > 75%;Moderate Assistance - Patient 50-74% Transfers Transfers: Lateral/Scoot Transfers Lateral/Scoot Transfers: Minimal Assistance - Patient > 75% Transfer (Assistive device): Other (Comment) (slide board) Locomotion  Gait Ambulation: No Gait Gait: No Stairs / Additional Locomotion Stairs: No Ramp: Moderate Assistance - Patient 50 - 74% Wheelchair Mobility Wheelchair Mobility: Yes Wheelchair Assistance: Doctor, General Practice: Both upper extremities Wheelchair Parts Management: Needs assistance Distance: 150+   Discharge Criteria: Patient will be discharged from PT if patient refuses treatment 3 consecutive times without medical reason, if treatment goals not met, if there is a change in medical status, if patient makes no progress towards goals or if patient is discharged from hospital.  The above assessment, treatment plan, treatment alternatives and goals were discussed and mutually agreed upon: by patient and by family  Reyes SHAUNNA Sierra 10/19/2024, 12:30 PM

## 2024-10-19 NOTE — Progress Notes (Signed)
 PROGRESS NOTE   Subjective/Complaints:  Pt doing well, slept well, pain manageable with meds, LBM 2 nights ago but pt states he usually only goes 1-2x/wk at home. Urinating fine. No other complaints or concerns.  Per chart review, diabetes plan outpatient was novolog  as a sliding scale, which he was using somewhat unconventionally per diabetes coordinator (see note 10/22, stated he was using it q2-4h, rather than q4h or with meals/bedtime). No long term insulin  regimen currently.   ROS: as per HPI. Denies CP, SOB, abd pain, N/V/D, or any other complaints at this time.    Objective:   No results found. Recent Labs    10/17/24 1502 10/19/24 0604  WBC 14.4* 11.9*  HGB 10.5* 10.4*  HCT 31.7* 31.6*  PLT 281 276   Recent Labs    10/18/24 1841 10/19/24 0604  NA 135 136  K 3.9 4.1  CL 101 101  CO2 23 25  GLUCOSE 241* 189*  BUN 15 16  CREATININE 0.93 0.92  CALCIUM  8.8* 8.8*        Intake/Output Summary (Last 24 hours) at 10/19/2024 1212 Last data filed at 10/19/2024 1146 Gross per 24 hour  Intake 400 ml  Output 1300 ml  Net -900 ml        Physical Exam: Vital Signs Blood pressure 103/69, pulse 76, temperature 98 F (36.7 C), resp. rate 18, height 5' 7 (1.702 m), weight 85.9 kg, SpO2 93%.  Constitutional: No apparent distress. Appropriate appearance for age. Sitting upright in w/c HENT: No JVD. Neck Supple. Trachea midline. Atraumatic, normocephalic. Eyes: PERRLA. EOMI. Visual fields grossly intact.  Cardiovascular: RRR, no murmurs/rub/gallops. 2+ R hip Edema, but none appreciated in R lower leg, or L upper leg (L lower leg wrapped so limited exam there). Peripheral pulses 2+  Respiratory: CTAB. No rales, rhonchi, or wheezing. On RA.  Abdomen: + bowel sounds, a little hypoactive but present. No distention or tenderness. Soft.  GU: Not examined.   Skin: C/D/I. No apparent lesions except those noted below. +  Right lateral thigh surgical dressing, with minimal drainage + Intact Mepilex dressings on right anterior thigh + Healing abrasion to right hand, elbow     MSK:      No apparent deformity. + Left lower extremity postop splint   PRIOR EXAMS: Neurologic exam:  Cognition: AAO to person, place, time and event.   Language: Fluent, No substitutions or neoglisms. No dysarthria.  Memory: No apparent deficits Insight: Good   insight into current condition.  Mood: Pleasant affect, appropriate mood.  Sensation: Intact to light touch in BL UE and Les   Reflexes: 2+ in BL UE and LEs.   CN: 2-12 grossly intact.   Coordination: No apparent tremors. No ataxia  Spasticity: MAS 0 in all extremities.     Strength:  5 out of 5 bilateral upper extremities 2 of 5 right hip, knee extension, 4/5 dorsiflexion and plantarflexion 4/5 left hip, 2/5 knee extension, toes 4/5  Assessment/Plan: 1. Functional deficits which require 3+ hours per day of interdisciplinary therapy in a comprehensive inpatient rehab setting. Physiatrist is providing close team supervision and 24 hour management of active medical problems listed below. Physiatrist and  rehab team continue to assess barriers to discharge/monitor patient progress toward functional and medical goals  Care Tool:  Bathing              Bathing assist       Upper Body Dressing/Undressing Upper body dressing        Upper body assist      Lower Body Dressing/Undressing Lower body dressing            Lower body assist       Toileting Toileting    Toileting assist       Transfers Chair/bed transfer  Transfers assist     Chair/bed transfer assist level: Moderate Assistance - Patient 50 - 74%     Locomotion Ambulation   Ambulation assist   Ambulation activity did not occur: N/A          Walk 10 feet activity   Assist  Walk 10 feet activity did not occur: N/A        Walk 50 feet activity   Assist Walk 50  feet with 2 turns activity did not occur: N/A         Walk 150 feet activity   Assist Walk 150 feet activity did not occur: N/A         Walk 10 feet on uneven surface  activity   Assist Walk 10 feet on uneven surfaces activity did not occur: N/A         Wheelchair     Assist Is the patient using a wheelchair?: Yes Type of Wheelchair: Manual    Wheelchair assist level: Supervision/Verbal cueing Max wheelchair distance: 150+    Wheelchair 50 feet with 2 turns activity    Assist        Assist Level: Supervision/Verbal cueing   Wheelchair 150 feet activity     Assist      Assist Level: Supervision/Verbal cueing   Blood pressure 103/69, pulse 76, temperature 98 F (36.7 C), resp. rate 18, height 5' 7 (1.702 m), weight 85.9 kg, SpO2 93%.  Medical Problem List and Plan: 1. Functional deficits secondary to right femur fracture, right tibial plateau fracture, and left calcaneal fracture status post fall, status post right distal femur ORIF by Dr. Georgina 10/14/24             -patient may not shower until surgical dressings removed             -ELOS/Goals: 7 to 10 days, modified independent PT/OT             -Continue CIR   2.  Antithrombotics: -DVT/anticoagulation:  Pharmaceutical: Xarelto 10mg  daily for 6 weeks postop             -antiplatelet therapy: Aspirin  held while on Xarelto, to be restarted after?  3. Pain Management: Robaxin 500 mg 4 times daily, Norco 1-2 tabs every 4 hours as needed, as needed Tylenol  - Per discharge, start opiate taper at 2 weeks postop (~11/4), finish taper at 6 weeks postsurgery   4. Mood/Behavior/Sleep: trazodone PRN, ego support             -antipsychotic agents: None  5. Neuropsych/cognition: This patient is capable of making decisions on his own behalf.  6. Skin/Wound Care: Maintain surgical dressing for 1 week postop (10/28)  7. Fluids/Electrolytes/Nutrition: routine I&O, routine labs -10/19/24 CMP with  hyperglycemia and low protein/albumin  but otherwise unremarkable. Could benefit from protein shakes if desired  8.  ABLA.  Hgb 10.4 on admission, stable.  Monitor, might consider MVI  9.  Type 2 diabetes.  On sliding scale insulin . Per diabetes coordinator notes from 10/22, pt on SSI Novolog  at home, but was dosing it q2-4h, somewhat of an unconventional schedule.  -10/19/24 CBGs pretty high consistently, looks like they may have used 10 and 15U daily of Semglee prior to his admission to us  (doses 10/24 for 10U, 10/25 at 10am for 15U) -will restart Semglee 15U nightly starting today, added HS SSI as well -monitor for effect, hopefully we'll see better control-- advised pt he will likely go home on Lantus, which he's agreeable with CBG (last 3)  Recent Labs    10/19/24 0105 10/19/24 0907 10/19/24 1144  GLUCAP 208* 278* 218*    10.  Hypertension.  On metoprolol  50mg  daily, Aldactone  25mg  daily, Losartan  100mg  daily. Monitor with increased mobility -10/19/24 BPs a little softer but stable/not concerning, monitor another day and see if adjustments should be made.  Vitals:   10/18/24 1400 10/18/24 2000 10/18/24 2058 10/19/24 0558  BP: (!) 110/59 (!) 114/48 (!) 104/48 112/68   10/19/24 0918  BP: 103/69     11.  Urinary retention.  On Flomax  0.4mg  daily  12.  Constipation.  Senokot S1 tab twice daily, having regular bowel movements now. Of note, pt states he goes 1-2x/wk at baseline -10/19/24 LBM 2 nights ago, not abnormal for him, will monitor one more day, if no BM by tomorrow would consider adding Colace/Miralax  13.  CAD s/p CABG with HLD. Aspirin  on hold.  On simvastatin  40mg  daily for cholesterol.  14.  Acid reflux.  Takes Pepcid  at home, Protonix  40 mg daily formulary.  15.  Left calcaneal fracture.  Managed nonoperatively, strict nonweightbearing LLE.  Follow-up with Dr. Georgina.  16.  Right femur and tibial plateau  fractures.  Right femur ORIF, tibial plateau nonop.   Nonweightbearing RLE.  Follow-up with Dr. Georgina.  17. Leukocytosis: likely reactive, downtrending on admission labs, monitor for s/sx of infection   I spent >75mins performing patient care related activities, including face to face time, documentation time, chart review, med management, discussion of meds with patient, and overall coordination of care.   LOS: 1 days A FACE TO FACE EVALUATION WAS PERFORMED  [address] 10/19/2024, 12:12 PM

## 2024-10-20 ENCOUNTER — Encounter (HOSPITAL_COMMUNITY): Payer: Self-pay | Admitting: Orthopedic Surgery

## 2024-10-20 DIAGNOSIS — S728X1A Other fracture of right femur, initial encounter for closed fracture: Secondary | ICD-10-CM | POA: Diagnosis not present

## 2024-10-20 LAB — BASIC METABOLIC PANEL WITH GFR
Anion gap: 11 (ref 5–15)
BUN: 17 mg/dL (ref 8–23)
CO2: 24 mmol/L (ref 22–32)
Calcium: 9.1 mg/dL (ref 8.9–10.3)
Chloride: 99 mmol/L (ref 98–111)
Creatinine, Ser: 0.98 mg/dL (ref 0.61–1.24)
GFR, Estimated: >60 mL/min
Glucose, Bld: 172 mg/dL — ABNORMAL HIGH (ref 70–99)
Potassium: 4.2 mmol/L (ref 3.5–5.1)
Sodium: 134 mmol/L — ABNORMAL LOW (ref 135–145)

## 2024-10-20 LAB — CBC
HCT: 33 % — ABNORMAL LOW (ref 39.0–52.0)
Hemoglobin: 10.9 g/dL — ABNORMAL LOW (ref 13.0–17.0)
MCH: 29.3 pg (ref 26.0–34.0)
MCHC: 33 g/dL (ref 30.0–36.0)
MCV: 88.7 fL (ref 80.0–100.0)
Platelets: 328 K/uL (ref 150–400)
RBC: 3.72 MIL/uL — ABNORMAL LOW (ref 4.22–5.81)
RDW: 14.4 % (ref 11.5–15.5)
WBC: 14 K/uL — ABNORMAL HIGH (ref 4.0–10.5)
nRBC: 0 % (ref 0.0–0.2)

## 2024-10-20 LAB — GLUCOSE, CAPILLARY
Glucose-Capillary: 172 mg/dL — ABNORMAL HIGH (ref 70–99)
Glucose-Capillary: 202 mg/dL — ABNORMAL HIGH (ref 70–99)
Glucose-Capillary: 168 mg/dL — ABNORMAL HIGH (ref 70–99)
Glucose-Capillary: 256 mg/dL — ABNORMAL HIGH (ref 70–99)

## 2024-10-20 LAB — MAGNESIUM: Magnesium: 1.7 mg/dL (ref 1.7–2.4)

## 2024-10-20 LAB — VITAMIN D 25 HYDROXY (VIT D DEFICIENCY, FRACTURES): Vit D, 25-Hydroxy: 28.13 ng/mL — ABNORMAL LOW (ref 30–100)

## 2024-10-20 MED ORDER — VITAMIN D 25 MCG (1000 UNIT) PO TABS
1000.0000 [IU] | ORAL_TABLET | Freq: Every day | ORAL | Status: DC
Start: 2024-10-20 — End: 2024-10-22
  Administered 2024-10-20 – 2024-10-21 (×2): 1000 [IU] via ORAL
  Filled 2024-10-20 (×2): qty 1

## 2024-10-20 MED ORDER — MAGNESIUM SULFATE 2 GM/50ML IV SOLN
2.0000 g | Freq: Once | INTRAVENOUS | Status: AC
  Administered 2024-10-20: 2 g via INTRAVENOUS
  Filled 2024-10-20: qty 50

## 2024-10-20 MED ORDER — LOSARTAN POTASSIUM 25 MG PO TABS
75.0000 mg | ORAL_TABLET | Freq: Every day | ORAL | Status: DC
Start: 2024-10-21 — End: 2024-10-23
  Administered 2024-10-21 – 2024-10-23 (×3): 75 mg via ORAL
  Filled 2024-10-20 (×4): qty 3

## 2024-10-20 MED ORDER — HYDROCODONE-ACETAMINOPHEN 5-325 MG PO TABS
1.0000 | ORAL_TABLET | ORAL | Status: DC | PRN
  Administered 2024-10-20 – 2024-10-22 (×3): 1 via ORAL
  Filled 2024-10-20 (×3): qty 1

## 2024-10-20 MED ORDER — INSULIN GLARGINE-YFGN 100 UNIT/ML ~~LOC~~ SOLN
16.0000 [IU] | Freq: Every day | SUBCUTANEOUS | Status: DC
  Administered 2024-10-20: 16 [IU] via SUBCUTANEOUS
  Filled 2024-10-20 (×2): qty 0.16

## 2024-10-20 MED ORDER — ACETAMINOPHEN 325 MG PO TABS: 325.0000 mg | ORAL_TABLET | ORAL | Status: DC | PRN

## 2024-10-20 NOTE — Progress Notes (Signed)
 Inpatient Rehabilitation  Patient information reviewed and entered into eRehab system by [facility]. Karen Kays., CCC/SLP, PPS Coordinator.  Information including medical coding, functional ability and quality indicators will be reviewed and updated through discharge.

## 2024-10-20 NOTE — Progress Notes (Signed)
 PROGRESS NOTE   Subjective/Complaints: C/o some right lower extremity pain but has not been asking for Norco as per family, but it appears he is receiving it as per med rec, will decrease to 1 tab q4H since pain is overall controlled  ROS: +RLE pain   Objective:   No results found. Recent Labs    10/19/24 0604 10/20/24 0529  WBC 11.9* 14.0*  HGB 10.4* 10.9*  HCT 31.6* 33.0*  PLT 276 328   Recent Labs    10/19/24 0604 10/20/24 0529  NA 136 134*  K 4.1 4.2  CL 101 99  CO2 25 24  GLUCOSE 189* 172*  BUN 16 17  CREATININE 0.92 0.98  CALCIUM  8.8* 9.1    Intake/Output Summary (Last 24 hours) at 10/20/2024 1159 Last data filed at 10/19/2024 2028 Gross per 24 hour  Intake 472 ml  Output 650 ml  Net -178 ml        Physical Exam: Vital Signs Blood pressure 115/63, pulse 80, temperature 98.3 F (36.8 C), resp. rate 18, height 5' 7 (1.702 m), weight 85.9 kg, SpO2 93%. Gen: no distress, normal appearing HEENT: oral mucosa pink and moist, NCAT Cardio: Reg rate Chest: normal effort, normal rate of breathing Abd: soft, non-distended Ext: no edema Psych: pleasant, normal affect Skin: C/D/I. No apparent lesions. + Right lateral thigh surgical dressing, with minimal drainage + Intact Mepilex dressings on right anterior thigh + Healing abrasion to right hand, elbow     MSK:      No apparent deformity. + Left lower extremity postop splint     Neurologic exam:  Cognition: AAO to person, place, time and event.   Language: Fluent, No substitutions or neoglisms. No dysarthria.  Memory: No apparent deficits Insight: Good   insight into current condition.  Mood: Pleasant affect, appropriate mood.  Sensation: Intact to light touch in BL UE and Les   Reflexes: 2+ in BL UE and LEs.   CN: 2-12 grossly intact.   Coordination: No apparent tremors. No ataxia  Spasticity: MAS 0 in all extremities.     Strength:  5  out of 5 bilateral upper extremities 2 of 5 right hip, knee extension, 4/5 dorsiflexion and plantarflexion 4/5 left hip, 2/5 knee extension, toes 4/5, stable 10/20/24   Assessment/Plan: 1. Functional deficits which require 3+ hours per day of interdisciplinary therapy in a comprehensive inpatient rehab setting. Physiatrist is providing close team supervision and 24 hour management of active medical problems listed below. Physiatrist and rehab team continue to assess barriers to discharge/monitor patient progress toward functional and medical goals  Care Tool:  Bathing    Body parts bathed by patient: Right arm, Left arm, Chest, Abdomen, Front perineal area, Right upper leg, Left upper leg, Face     Body parts n/a: Buttocks, Right lower leg, Left lower leg   Bathing assist Assist Level: Moderate Assistance - Patient 50 - 74%     Upper Body Dressing/Undressing Upper body dressing   What is the patient wearing?: Pull over shirt    Upper body assist Assist Level: Set up assist    Lower Body Dressing/Undressing Lower body dressing  What is the patient wearing?: Underwear/pull up, Pants     Lower body assist Assist for lower body dressing: Maximal Assistance - Patient 25 - 49% (Mod to MaxA for threading leg through the opening)     Toileting Toileting    Toileting assist Assist for toileting: Maximal Assistance - Patient 25 - 49% (MaxA to Dep)     Transfers Chair/bed transfer  Transfers assist  Chair/bed transfer activity did not occur: Safety/medical concerns  Chair/bed transfer assist level: Moderate Assistance - Patient 50 - 74% (using SBT per chart and staff report)     Locomotion Ambulation   Ambulation assist   Ambulation activity did not occur: N/A          Walk 10 feet activity   Assist  Walk 10 feet activity did not occur: N/A        Walk 50 feet activity   Assist Walk 50 feet with 2 turns activity did not occur: N/A          Walk 150 feet activity   Assist Walk 150 feet activity did not occur: N/A         Walk 10 feet on uneven surface  activity   Assist Walk 10 feet on uneven surfaces activity did not occur: N/A         Wheelchair     Assist Is the patient using a wheelchair?: Yes Type of Wheelchair: Manual    Wheelchair assist level: Supervision/Verbal cueing Max wheelchair distance: 150+    Wheelchair 50 feet with 2 turns activity    Assist        Assist Level: Supervision/Verbal cueing   Wheelchair 150 feet activity     Assist      Assist Level: Supervision/Verbal cueing   Blood pressure 115/63, pulse 80, temperature 98.3 F (36.8 C), resp. rate 18, height 5' 7 (1.702 m), weight 85.9 kg, SpO2 93%.  Medical Problem List and Plan: 1. Functional deficits secondary to right femur fracture, right tibial plateau fracture, and left calcaneal fracture status post fall, status post right distal femur ORIF by Dr. Georgina 10-21             -patient may not shower until surgical dressings removed             -ELOS/Goals: 7 to 10 days, modified independent PT/OT             - Stable for admission to inpatient rehab   2.  Antithrombotics: -DVT/anticoagulation:  Pharmaceutical: Xarelto for 6 weeks postop             -antiplatelet therapy: Aspirin  held while on Xarelto 3. Pain Management: Robaxin 500 mg 4 times daily, decrease norco to 1 tab q4H as needed Tylenol              - Per discharge, start opiate taper at 2 weeks postop, finish taper at 6 weeks postsurgery   4. Mood/Behavior/Sleep: None             -antipsychotic agents: None 5. Neuropsych/cognition: This patient is capable of making decisions on his own behalf. 6. Skin/Wound Care: Maintain surgical dressing for 1 week postop 7. History of severe vitamin D  deficiency: start D3 daily  8.  Anemia.  Labs in AM. 9.  Type 2 diabetes.  On sliding scale insulin . 10.  Hypertension.  On metoprolol , Aldactone ,  Cozaar . 11.  Urinary retention.  On Flomax . 12.  Constipation.  Senokot S1 tab twice daily, asked nursing to please offer  prune juice, magnesium  IV 2 grams ordered 10/27 ordered for suboptimal levels- may also help bowels  13.  CAD s/p CABG. Aspirin  on hold.  On simvastatin . Supplement magnesium  to goal of 2  14.  Acid reflux.  Takes Pepcid  at home, Protonix  40 mg daily formulary.  15.  Left calcaneal fracture.  Managed nonoperatively, strict nonweightbearing.  Follow-up with Dr. Georgina.  16.  Right femur and tibial plateau  fractures.  Right femur ORIF, tibial plateau nonop.  Nonweightbearing.  Follow-up with Dr. Georgina.    LOS: 2 days A FACE TO FACE EVALUATION WAS PERFORMED  Antonio Patterson P Nilani Hugill 10/20/2024, 11:59 AM

## 2024-10-20 NOTE — Progress Notes (Signed)
 [facility] Individual Statement of Services  Patient Name:  Antonio Patterson  Date:  10/20/2024  Welcome to the [facility]patient [facility].  Our goal is to provide you with an individualized program based on your diagnosis and situation, designed to meet your specific needs.  With this comprehensive rehabilitation program, you will be expected to participate in at least 3 hours of rehabilitation therapies Monday-Friday, with modified therapy programming on the weekends.  Your rehabilitation program will include the following services:  Physical Therapy (PT), Occupational Therapy (OT), 24 hour per day rehabilitation nursing, Therapeutic Recreaction (TR), Care Coordinator, Rehabilitation Medicine, Nutrition Services, and Pharmacy Services  Weekly team conferences will be held on Wednesday to discuss your progress.  Your Inpatient Rehabilitation Care Coordinator will talk with you frequently to get your input and to update you on team discussions.  Team conferences with you and your family in attendance may also be held.  Expected length of stay: 7 days  Overall anticipated outcome: Supervision/Verbal cueing   Depending on your progress and recovery, your program may change. Your Inpatient Rehabilitation Care Coordinator will coordinate services and will keep you informed of any changes. Your Inpatient Rehabilitation Care Coordinator's name and contact numbers are listed  below.  The following services may also be recommended but are not provided by the Inpatient Rehabilitation Center:  Driving Evaluations Home Health Rehabiltiation Services Outpatient Rehabilitation Services Vocational Rehabilitation   Arrangements will be made to provide these services after discharge if needed.  Arrangements include referral to agencies that provide these services.  Your insurance has been verified to be: MEDICARE / MEDICARE PART A AND B  Your primary doctor is:  Silvano Angeline FALCON,  NP  Pertinent information will be shared with your doctor and your insurance company.  Inpatient Rehabilitation Care Coordinator:  Di'Asia Loreli SIERRAS [phone] or ELIGAH BRINKS  Information discussed with and copy given to patient by: Waverly Loreli, 10/20/2024, 12:30 PM

## 2024-10-20 NOTE — Anesthesia Postprocedure Evaluation (Signed)
 Anesthesia Post Note  Patient: Antonio Patterson  Procedure(s) Performed: OPEN REDUCTION INTERNAL FIXATION (ORIF) DISTAL FEMUR FRACTURE (Right)     Patient location during evaluation: PACU Anesthesia Type: Spinal and MAC Level of consciousness: awake and alert Pain management: pain level controlled Vital Signs Assessment: post-procedure vital signs reviewed and stable Respiratory status: spontaneous breathing, nonlabored ventilation and respiratory function stable Cardiovascular status: stable and blood pressure returned to baseline Postop Assessment: no apparent nausea or vomiting Anesthetic complications: no   There were no known notable events for this encounter.                [place] Severs

## 2024-10-20 NOTE — Progress Notes (Signed)
 Patient ID: Antonio Patterson, male   DOB: Dec 25, 1951, 73 y.o.   MRN: [id number] Met with the patient and spouse with dtr to review current medical situation, rehab process, team conference and plan of care. Reviewed weight bearing restrictions, with care of left ankle splint. Given information on incision care and when he can get site wet with bathing and staple removal at follow up with MD.  Reviewed DVT prophylactic with Xarelto  x 6 weeks. CMM/HH diet and scheduled medications.  Concerns expressed regarding DME that was ordered on acute and ramp delivered to the home on Friday.  SW notified of concerns. Continue to follow along to address educational needs to facilitate preparation for discharge. Fredericka Barnie NOVAK

## 2024-10-20 NOTE — Progress Notes (Signed)
 Physical Therapy Session Note  Patient Details  Name: Antonio Patterson MRN: [id number] Date of Birth: 04/07/1951  Today's Date: 10/20/2024 PT Individual Time: 1000-1113 PT Individual Time Calculation (min): 73 min   Short Term Goals: Week 1:  PT Short Term Goal 1 (Week 1): STG+LTG 2/2 ELOS.  Skilled Therapeutic Interventions/Progress Updates:    Session focused on education and discussion with pt and family (daughter and wife) in regards to d/c planning and equipment needs/recommendations. Discussed that pt is using a ROHO here but likely that would not be covered by insurance, so suggested trial of foam cushion also to determine if pt can tolerate. Extra time for bed mobility and occasional assist with RLE management but encouraged pt to do as much as he can himself. Transitioned to seated position with CGA overall from supine. Slideboard transfer training during sesison with introduction of pt management of legrests and w/c parts with min A and working on pt positioning and removing SB as well. Completed transfer with CGA to min A on uneven surface. Focused on supine therex for functional strengthening and ROM and education on importance - handout also given. Completed AROM on LLE and AAROM on RLE for hip abduction, SAQ, SLR (modified and supported) x 10-15 reps each BLE. W/c propulsion on unit to and from therapy with S for cues for technique. Transferred back to bed end of session per pt request - therapist suggested to stay up in w/c to trial cushion but pt request to return to bed. CGA/min A overall and repositioned in supine with all needs in reach.   Therapy Documentation Precautions:  Precautions Precautions: Fall Recall of Precautions/Restrictions: Intact Precaution/Restrictions Comments: NWB BLES Required Braces or Orthoses: Splint/Cast Splint/Cast: Short leg spint LLE Splint/Cast - Date Prophylactic Dressing Applied (if applicable): 10/17/24 Restrictions Weight Bearing  Restrictions Per Provider Order: Yes RLE Weight Bearing Per Provider Order: Non weight bearing LLE Weight Bearing Per Provider Order: Non weight bearing    Pain: Reports no pain at rest but up to 8/10 with movement. Nursing notified and pt already received pain medication. Discussed use of ice packs as well for pain management.    Therapy/Group: Individual Therapy  Elnor Pizza Sherrell Pizza WENDI Elnor, PT, DPT, CBIS  10/20/2024, 12:15 PM

## 2024-10-20 NOTE — Progress Notes (Signed)
 Occupational Therapy Session Note  Patient Details  Name: Antonio Patterson MRN: [id number] Date of Birth: [birth year]  Today's Date: 10/20/2024 OT Individual Time: 1403-1504 ( session 2) OT Individual Time Calculation (min): 61 min   OT Individual Time: [phone]( session 1) OT Individual Time Calculation (min): 76 min   Short Term Goals: Week 1:  OT Short Term Goal 1 (Week 1): The pt will safely transfer from w/c  LOF  to all surfaces with ModI using SB OT Short Term Goal 2 (Week 1): The pt will safely complete LB bathing/  dressing with ModI incorporting AE as needed. OT Short Term Goal 3 (Week 1): The pt will tolerate >30 minutes of therapeutic activity requiring <  3 rest breaks . OT Short Term Goal 4 (Week 1): The pt will safely complete toileting hygiene with ModI incorporating AE as needed.  Skilled Therapeutic Interventions/Progress Updates:  Session 1: Pt greeted supine in bed, pt agreeable to OT intervention.    Pt reports 7/10 pain in RLE, pain meds provided, repositioning and rest breaks provided as needed.   Transfers/bed mobility/functional mobility:  Pt completed supine>sit with CGA with use of bed features. Pt placed slide board with contact guard assist from EOB. CGA overall for slide board transfer to L side from EOB>w/c.  Min verbal cues needed to maintain non weight bearing in BLEs.   Pt completed additional slide board transfer to L side with CGA. Education provided on recommended placement of slide board with pt needing to fell slide board in between legs.     ADLs:  Grooming: washed hair from sink with MIN A, offered hair washing tray but pt declined.  UB dressing:pt donned [place] shirt with set- up assist.  LB dressing: pt donned pants via lateral leans from w/c with MAXA. Recommended looser shorts for home as pt has shorts with buttons right now that are difficult to manipulate. Pt and family receptive to recommendation.     Bathing: pt completed wash up at sink  with MIN A overall, education provided on various techniques for bathing at home such as washing up at sink or at [place] table with wash basin. Recommended sitting on BSC to bathe so pt could access buttock from sitting, or discussed completing LB bathing from bed level in AM prior to getting OOB for the day. Pt receptive to both options. After completing bathing from sitting in w/c pt feels as though bathing from bed level may be easier as pt with decreased ability to laterally lean to access LB from w/c.     Ended session with pt supine  in bed with all needs within reach and bed alarm activated.                      Session 2: Pt greeted supine in bed, pt agreeable to OT intervention.    Unrated pain reported in RLE, rest breaks and repositioning provided as needed.   Transfers/bed mobility/functional mobility:  Pt completed supine>sit with CGA with use of bed features. Pt completed slide board transfers to w/c with CGA, pt able to place board. Pt requires intermittent min cues for safe/effective set- up of slide board.   Pt completed simulated toileting task from EOM with pt instructed to laterally lean to remove theraband around waist to simulate lowering pants. Pt completed task with CGA.     ADLs: pt completed slide board transfer to [facility] from EOB with CGA, did place dycem under board to decrease  sliding of board on slick surface. Pt then able to complete additional slide board transfer from DABSC>w/c with CGA. MIN verbal cues needed for safe set- up of board when scooting to/from [facility]. Did discuss potentially trying slide board to bari [facility] to allow for increased surface area to manage pants for toileting.                   Ended session with pt supine in bed with all needs within reach and bed alarm activated.                    Therapy Documentation Precautions:  Precautions Precautions: Fall Recall of Precautions/Restrictions: Intact Precaution/Restrictions Comments: NWB  BLES Required Braces or Orthoses: Splint/Cast Splint/Cast: Short leg spint LLE Splint/Cast - Date Prophylactic Dressing Applied (if applicable): 10/17/24 Restrictions Weight Bearing Restrictions Per Provider Order: Yes RLE Weight Bearing Per Provider Order: Non weight bearing LLE Weight Bearing Per Provider Order: Non weight bearing    Therapy/Group: Individual Therapy  Ronal Mallie Needy 10/20/2024, 3:14 PM

## 2024-10-20 NOTE — Plan of Care (Signed)
  Problem: RH BOWEL ELIMINATION Goal: RH STG MANAGE BOWEL WITH ASSISTANCE Description: STG Manage Bowel with mod I Assistance. Outcome: Progressing   Problem: RH PAIN MANAGEMENT Goal: RH STG PAIN MANAGED AT OR BELOW PT'S PAIN GOAL Description: Pain < 4 with prns Outcome: Progressing   Problem: RH BLADDER ELIMINATION Goal: RH STG MANAGE BLADDER WITH ASSISTANCE Description: STG Manage Bladder With mod I Assistance Outcome: Progressing Goal: RH STG MANAGE BLADDER WITH MEDICATION WITH ASSISTANCE Description: STG Manage Bladder With Medication With mod I Assistance. Outcome: Progressing   Problem: RH SAFETY Goal: RH STG ADHERE TO SAFETY PRECAUTIONS W/ASSISTANCE/DEVICE Description: STG Adhere to Safety Precautions With cues Assistance/Device. Outcome: Progressing   Problem: RH KNOWLEDGE DEFICIT GENERAL Goal: RH STG INCREASE KNOWLEDGE OF SELF CARE AFTER HOSPITALIZATION Description: Patient and spouse will be able to manage care at discharge using educational resources for medication, skin care and dietary modification independently Outcome: Progressing   Problem: Consults Goal: RH GENERAL PATIENT EDUCATION Description: See Patient Education module for education specifics. Outcome: Progressing Goal: Diabetes Guidelines if Diabetic/Glucose > 140 Description: If diabetic or lab glucose is > 140 mg/dl - Initiate Diabetes/Hyperglycemia Guidelines & Document Interventions  Outcome: Progressing   Problem: Education: Goal: Ability to describe self-care measures that may prevent or decrease complications (Diabetes Survival Skills Education) will improve Outcome: Progressing Goal: Individualized Educational Video(s) Outcome: Progressing   Problem: Coping: Goal: Ability to adjust to condition or change in health will improve Outcome: Progressing   Problem: Fluid Volume: Goal: Ability to maintain a balanced intake and output will improve Outcome: Progressing   Problem: Health  Behavior/Discharge Planning: Goal: Ability to identify and utilize available resources and services will improve Outcome: Progressing Goal: Ability to manage health-related needs will improve Outcome: Progressing   Problem: Metabolic: Goal: Ability to maintain appropriate glucose levels will improve Outcome: Progressing   Problem: Nutritional: Goal: Maintenance of adequate nutrition will improve Outcome: Progressing Goal: Progress toward achieving an optimal weight will improve Outcome: Progressing   Problem: Skin Integrity: Goal: Risk for impaired skin integrity will decrease Outcome: Progressing   Problem: Tissue Perfusion: Goal: Adequacy of tissue perfusion will improve Outcome: Progressing

## 2024-10-20 NOTE — Progress Notes (Signed)
 Patient ID: Antonio Patterson, male   DOB: Oct 15, 1951, 73 y.o.   MRN: [id number]  Statement of service delivered.

## 2024-10-20 NOTE — Progress Notes (Signed)
 Patient ID: Antonio Patterson, male   DOB: 07/17/1951, 73 y.o.   MRN: [id number]  Spoke with Mitch regarding DME for patient - he said it will be delivered 24-48 hours prior to discharge. DME ordered is a slide board, wheelchair, and hoyer lift and BSC.  1144: Spoke with Thomasina again to verify where DME would be delivered. Wheelchair, slide board,  and BSC to be delivered to room prior to discharge and daughter, [name] will take it home. If the hoyer life is needed, it will be delivered to the home upon discharge. Family aware.

## 2024-10-20 NOTE — Progress Notes (Signed)
 Inpatient Rehabilitation Care Coordinator Assessment and Plan Patient Details  Name: Antonio Patterson MRN: [id number] Date of Birth: September 10, 1951  Today's Date: 10/20/2024  Hospital Problems: Principal Problem:   Other fracture of right femur, initial encounter for closed fracture [facility])  Past Medical History:  Past Medical History:  Diagnosis Date   ALLERGIC RHINITIS 08/10/2007   BLADDER OUTLET OBSTRUCTION 11/13/2008   Coronary artery disease    DIABETES MELLITUS, TYPE II 08/10/2007   Dyspnea    with exertion   ERECTILE DYSFUNCTION 11/13/2008   History of hiatal hernia    History of kidney stones    HYPERLIPIDEMIA 08/10/2007   HYPERTENSION 08/10/2007   PERS HX NONCOMPLIANCE W/MED TX PRS HAZARDS HLTH 11/13/2008   Seborrhea 11/13/2008   Sleep apnea    TINNITUS, CHRONIC, 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   UNSPECIFIED PERIPHERAL VASCULAR DISEASE 11/13/2008   Past Surgical History:  Past Surgical History:  Procedure Laterality Date   CORONARY ARTERY BYPASS GRAFT N/A 08/17/2022   Procedure: CORONARY ARTERY BYPASS GRAFTING (CABG) X3 BYPASSES USING OPEN LEFT INTERNAL MAMMARY ARTERY AND ENDOSCOPIC RIGHT GREATER SAPHENOUS VEIN HARVEST.;  Surgeon: Shyrl Linnie KIDD, MD;  Location: [facility] OR;  Service: Open Heart Surgery;  Laterality: N/A;   CORONARY PRESSURE/FFR STUDY N/A 07/14/2022   Procedure: INTRAVASCULAR PRESSURE WIRE/FFR STUDY;  Surgeon: Mady Bruckner, MD;  Location: [facility] INVASIVE CV LAB;  Service: Cardiovascular;  Laterality: N/A;   ORIF FEMUR FRACTURE Right 10/14/2024   Procedure: OPEN REDUCTION INTERNAL FIXATION (ORIF) DISTAL FEMUR FRACTURE;  Surgeon: Georgina Ozell LABOR, MD;  Location: [facility] OR;  Service: Orthopedics;  Laterality: Right;   RIGHT/LEFT HEART CATH AND CORONARY ANGIOGRAPHY N/A 07/14/2022   Procedure: RIGHT/LEFT HEART CATH AND CORONARY ANGIOGRAPHY;  Surgeon: Mady Bruckner, MD;  Location: [facility] INVASIVE CV LAB;  Service: Cardiovascular;  Laterality: N/A;   TEE WITHOUT CARDIOVERSION N/A  08/17/2022   Procedure: TRANSESOPHAGEAL ECHOCARDIOGRAM (TEE);  Surgeon: Shyrl Linnie KIDD, MD;  Location: [facility] OR;  Service: Open Heart Surgery;  Laterality: N/A;   TURBINATE REDUCTION Bilateral 2017   VASECTOMY  1976   Social History:  reports that he quit smoking about 54 years ago. His smoking use included cigarettes. He started smoking about 64 years ago. He has a 10 pack-year smoking history. He has never used smokeless tobacco. He reports current alcohol use. He reports that he does not use drugs.  Family / Support Systems Marital Status: Married How Long?: 54 Patient Roles: Spouse, Parent Spouse/Significant Other: Kay Children: Antonio Patterson [phone] Other Supports: SIL Anticipated Caregiver: Antonio Patterson Ability/Limitations of Caregiver: None addressed Caregiver Availability: 24/7 Family Dynamics: 24/7  Social History Preferred language: English Religion: Forensic Psychologist - How often do you need to have someone help you when you read instructions, pamphlets, or other written material from your doctor or pharmacy?: Never Writes: Yes Employment Status: Employed Name of Employer: Work From Home Return to Work Plans: Plans on returning to work from home job until able to go back in the field   Abuse/Neglect Abuse/Neglect Assessment Can Be Completed: Yes Physical Abuse: Denies Verbal Abuse: Denies Sexual Abuse: Denies Exploitation of patient/patient's resources: Denies Self-Neglect: Denies  Patient response to: Social Isolation - How often do you feel lonely or isolated from those around you?: Never  Emotional Status Pt's affect, behavior and adjustment status: Adjusting well to therapy Recent Psychosocial Issues: None Psychiatric History: None Substance Abuse History: None  Patient / Family Perceptions, Expectations & Goals Premorbid pt/family roles/activities: Patient/family understaning of illness & functional limitations Anticipated changes in  roles/activities/participation: None 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  [employer] Agencies: None Premorbid Home Care/DME Agencies: Other (Comment) (Ramp and handicap equipped home) Transportation available at discharge: Yes Is the patient able to respond to transportation needs?: Yes In the past 12 months, has lack of transportation kept you from medical appointments or from getting medications?: No In the past 12 months, has lack of transportation kept you from meetings, work, or from getting things needed for daily living?: No  Discharge Planning Living Arrangements: Spouse/significant other, Children Support Systems: Spouse/significant other, Children Type of Residence: Private residence Insurance Resources: Media [employer]anner (specify) Financial Resources: Employment Financial Screen Referred: No Living Expenses: Own Money Management: Patient, Spouse Does the patient have any problems obtaining your medications?: No Home Management: Patient/spouse Patient/Family Preliminary Plans: Plans to return home and if necessary will stay a few days with his daughter Care Coordinator Anticipated Follow Up Needs: HH/OP Expected length of stay: 7-10 days  Clinical Impression CSW met with patient/family to introduce herself and complete initial assessment. Patient is [employer] and able to make all needs known. Patient is a 73 y/o who admitted to [facility] following a femur fracture. He may be a short LOS. Both his home and his daughters home are handicap equipped with a ramp and other needs. DME ordered: Wheelchair, slideboard, BSC, and hoyer lift through [employer] following and will need to be updated prior to discharge. Therapy goals include returning home to work. Transportation will be provided by patient's daughter Antonio Patterson upon discharge. Patient prefers HH to start out. There were no further needs or concerns at present. CSW will follow up with  family and continue to follow.   Di'Asia  Loreli 10/20/2024, 12:26 PM

## 2024-10-21 DIAGNOSIS — S728X1A Other fracture of right femur, initial encounter for closed fracture: Secondary | ICD-10-CM | POA: Diagnosis not present

## 2024-10-21 LAB — GLUCOSE, CAPILLARY
Glucose-Capillary: 275 mg/dL — ABNORMAL HIGH (ref 70–99)
Glucose-Capillary: 245 mg/dL — ABNORMAL HIGH (ref 70–99)
Glucose-Capillary: 129 mg/dL — ABNORMAL HIGH (ref 70–99)
Glucose-Capillary: 191 mg/dL — ABNORMAL HIGH (ref 70–99)
Glucose-Capillary: 156 mg/dL — ABNORMAL HIGH (ref 70–99)

## 2024-10-21 MED ORDER — MAGNESIUM GLUCONATE 500 (27 MG) MG PO TABS
250.0000 mg | ORAL_TABLET | Freq: Every day | ORAL | Status: DC
  Administered 2024-10-21: 250 mg via ORAL
  Filled 2024-10-21: qty 1

## 2024-10-21 MED ORDER — ACARBOSE 25 MG PO TABS
25.0000 mg | ORAL_TABLET | Freq: Three times a day (TID) | ORAL | Status: DC
  Administered 2024-10-21 – 2024-10-23 (×6): 25 mg via ORAL
  Filled 2024-10-21 (×6): qty 1

## 2024-10-21 MED ORDER — INSULIN GLARGINE-YFGN 100 UNIT/ML ~~LOC~~ SOLN
20.0000 [IU] | Freq: Every day | SUBCUTANEOUS | Status: DC
  Administered 2024-10-21: 20 [IU] via SUBCUTANEOUS
  Filled 2024-10-21 (×2): qty 0.2

## 2024-10-21 NOTE — Progress Notes (Signed)
 PROGRESS NOTE   Subjective/Complaints: No new complaints this morning Discussed elevated CBGs, starting acarbose and increasing long acting insulin , discussed goal of decreasing short acting insulin   ROS: +RLE pain, LBM 10/27   Objective:   No results found. Recent Labs    10/19/24 0604 10/20/24 0529  WBC 11.9* 14.0*  HGB 10.4* 10.9*  HCT 31.6* 33.0*  PLT 276 328   Recent Labs    10/19/24 0604 10/20/24 0529  NA 136 134*  K 4.1 4.2  CL 101 99  CO2 25 24  GLUCOSE 189* 172*  BUN 16 17  CREATININE 0.92 0.98  CALCIUM  8.8* 9.1    Intake/Output Summary (Last 24 hours) at 10/21/2024 1041 Last data filed at 10/21/2024 0829 Gross per 24 hour  Intake 676 ml  Output 1475 ml  Net -799 ml        Physical Exam: Vital Signs Blood pressure 119/68, pulse 71, temperature 98.5 F (36.9 C), resp. rate 17, height 5' 7 (1.702 m), weight 85.9 kg, SpO2 93%. Gen: no distress, normal appearing HEENT: oral mucosa pink and moist, NCAT Cardio: Reg rate Chest: normal effort, normal rate of breathing Abd: soft, non-distended Ext: no edema Psych: pleasant, normal affect Skin: C/D/I. No apparent lesions. + Right lateral thigh surgical dressing, with minimal drainage + Intact Mepilex dressings on right anterior thigh + Healing abrasion to right hand, elbow     MSK:      No apparent deformity. + Left lower extremity postop splint     Neurologic exam:  Cognition: AAO to person, place, time and event.   Language: Fluent, No substitutions or neoglisms. No dysarthria.  Memory: No apparent deficits Insight: Good   insight into current condition.  Mood: Pleasant affect, appropriate mood.  Sensation: Intact to light touch in BL UE and Les   Reflexes: 2+ in BL UE and LEs.   CN: 2-12 grossly intact.   Coordination: No apparent tremors. No ataxia  Spasticity: MAS 0 in all extremities.     Strength:  5 out of 5 bilateral  upper extremities 2 of 5 right hip, knee extension, 4/5 dorsiflexion and plantarflexion 4/5 left hip, 2/5 knee extension, toes 4/5, stable 10/28   Assessment/Plan: 1. Functional deficits which require 3+ hours per day of interdisciplinary therapy in a comprehensive inpatient rehab setting. Physiatrist is providing close team supervision and 24 hour management of active medical problems listed below. Physiatrist and rehab team continue to assess barriers to discharge/monitor patient progress toward functional and medical goals  Care Tool:  Bathing    Body parts bathed by patient: Right arm, Left arm, Chest, Abdomen, Front perineal area, Right upper leg, Left upper leg, Face     Body parts n/a: Buttocks, Right lower leg, Left lower leg   Bathing assist Assist Level: Moderate Assistance - Patient 50 - 74%     Upper Body Dressing/Undressing Upper body dressing   What is the patient wearing?: Pull over shirt    Upper body assist Assist Level: Set up assist    Lower Body Dressing/Undressing Lower body dressing      What is the patient wearing?: Underwear/pull up, Pants     Lower body  assist Assist for lower body dressing: Maximal Assistance - Patient 25 - 49%     Toileting Toileting    Toileting assist Assist for toileting: Maximal Assistance - Patient 25 - 49% (MaxA to Dep)     Transfers Chair/bed transfer  Transfers assist  Chair/bed transfer activity did not occur: Safety/medical concerns  Chair/bed transfer assist level: Contact Guard/Touching assist     Locomotion Ambulation   Ambulation assist   Ambulation activity did not occur: Safety/medical concerns (Pt. indep PTA)          Walk 10 feet activity   Assist           Walk 50 feet activity   Assist           Walk 150 feet activity   Assist           Walk 10 feet on uneven surface  activity   Assist Walk 10 feet on uneven surfaces activity did not occur: Safety/medical  concerns         Wheelchair     Assist Is the patient using a wheelchair?: Yes Type of Wheelchair: Manual    Wheelchair assist level: Supervision/Verbal cueing Max wheelchair distance: 150+    Wheelchair 50 feet with 2 turns activity    Assist        Assist Level: Supervision/Verbal cueing   Wheelchair 150 feet activity     Assist      Assist Level: Supervision/Verbal cueing   Blood pressure 119/68, pulse 71, temperature 98.5 F (36.9 C), resp. rate 17, height 5' 7 (1.702 m), weight 85.9 kg, SpO2 93%.  Medical Problem List and Plan: 1. Functional deficits secondary to right femur fracture, right tibial plateau fracture, and left calcaneal fracture status post fall, status post right distal femur ORIF by Dr. Georgina 10-21             -patient may not shower until surgical dressings removed             -ELOS/Goals: 7 to 10 days, modified independent PT/OT             - Stable for admission to inpatient rehab   2.  Antithrombotics: -DVT/anticoagulation:  Pharmaceutical: Xarelto for 6 weeks postop             -antiplatelet therapy: Aspirin  held while on Xarelto 3. Pain Management: Robaxin 500 mg 4 times daily, decrease norco to 1 tab q4H as needed Tylenol              - Per discharge, start opiate taper at 2 weeks postop, finish taper at 6 weeks postsurgery   4. Mood/Behavior/Sleep: None             -antipsychotic agents: None 5. Neuropsych/cognition: This patient is capable of making decisions on his own behalf. 6. Skin/Wound Care: Maintain surgical dressing for 1 week postop 7. History of severe vitamin D  deficiency: start D3 daily  8.  Anemia.  Labs in AM. 9.  Type 2 diabetes.  On sliding scale insulin . Increased long acting insulin , added acarbose  10.  Hypertension.  On metoprolol , Aldactone , Cozaar . 11.  Urinary retention: continue Flomax .  12.  Constipation.  Senokot S1 tab twice daily, asked nursing to please offer prune juice, magnesium  IV 2  grams ordered 10/27 ordered for suboptimal levels- may also help bowels, daily magnesium  supplement started HS  13.  CAD s/p CABG. Aspirin  on hold.  On simvastatin . Supplement magnesium  to goal of 2, daily supplement started HS  14.  Acid reflux.  Takes Pepcid  at home, Protonix  40 mg daily formulary, continue  15.  Left calcaneal fracture.  Managed nonoperatively, strict nonweightbearing.  Follow-up with Dr. Georgina.  16.  Right femur and tibial plateau  fractures.  Right femur ORIF, tibial plateau nonop.  Nonweightbearing.  Follow-up with Dr. Georgina.    LOS: 3 days A FACE TO FACE EVALUATION WAS PERFORMED  Antonio Patterson Tildon Silveria 10/21/2024, 10:41 AM

## 2024-10-21 NOTE — Discharge Summary (Shared)
 Physician Discharge Summary  Patient ID: Antonio Patterson MRN: [id number] DOB/AGE: Apr 16, 1951 73 y.o.  Admit date: 10/18/2024 Discharge date:   Discharge Diagnoses:  Principal Problem:   Other fracture of right femur, initial encounter for closed fracture [facility]) DVT prophylaxis Pain management Anemia Diabetes mellitus Hypertension BPH Constipation CAD  Discharged Condition: Stable  Significant Diagnostic Studies: DG FEMUR, MIN 2 VIEWS RIGHT Result Date: 10/14/2024 EXAM: FLUOROSCOPIC IMAGES TECHNIQUE: Fluoroscopy was provided by the radiology department for procedure. Radiologist was not present during examination. FLUOROSCOPY DOSE AND TYPE: Radiation Dose Index: Reference Air Kerma (in mGy) = 18.75 mGy Fluoro time: 3 minutes 7 seconds COMPARISON: None available. CLINICAL HISTORY: Intraprocedural imaging. FINDINGS: Intraoperative fluoroscopic imaging was performed during plate and screw fixation of the right femur fracture . IMPRESSION: 1. Intraoperative fluoroscopic spot images as above. NOTE: Intraoperative fluoroscopic spot images as above. Please refer to the intraoperative report for full details. Electronically signed by: Norman Gatlin MD 10/14/2024 10:05 PM EDT RP Workstation: [id number]   DG C-Arm 1-60 Min-No Report Result Date: 10/14/2024 Fluoroscopy was utilized by the requesting physician.  No radiographic interpretation.   DG C-Arm 1-60 Min-No Report Result Date: 10/14/2024 Fluoroscopy was utilized by the requesting physician.  No radiographic interpretation.   DG C-Arm 1-60 Min-No Report Result Date: 10/14/2024 Fluoroscopy was utilized by the requesting physician.  No radiographic interpretation.   DG C-Arm 1-60 Min-No Report Result Date: 10/14/2024 Fluoroscopy was utilized by the requesting physician.  No radiographic interpretation.   CT FOOT LEFT WO CONTRAST Result Date: 10/13/2024 EXAM: CT left Extremity 10/13/2024 09:01:50 PM TECHNIQUE: CT of the left  extremity was performed without the administration of intravenous contrast. Multiplanar reformatted images are provided for review. COMPARISON: Same day x-ray. CLINICAL HISTORY: Evaluate calcaneus fracture. Patient fell about 8 feet off of the ladder and landed on both of his feet. He said he had obvious pain and deformity to his right upper leg and was also having pain to his left heel. FINDINGS: BONES: Markedly comminuted fracture of the calcaneus. Fracture lines extend into the subtalar joint and calcaneocuboid joint. There are a few osseous fragments superior to the posterolateral talar dome within the tibiofibular syndesmosis with unclear donor site. Well-corticated ossicle inferior to the lateral malleolus compatible with sequelae of remote trauma. SOFT TISSUE: Swelling about the ankle greatest laterally. IMPRESSION: 1. Markedly comminuted left calcaneal fracture with intra-articular extension into the subtalar and calcaneocuboid joints. 2. Osseous fragments within the distal tibiofibular syndesmosis superior to the posterolateral talar dome with unclear donor site. Electronically signed by: Norman Gatlin MD 10/13/2024 09:24 PM EDT RP Workstation: [id number]   CT KNEE RIGHT WO CONTRAST Result Date: 10/13/2024 EXAM: CT RIGHT KNEE, WITHOUT IV CONTRAST 10/13/2024 09:01:50 PM TECHNIQUE: Axial images were acquired through the right knee without IV contrast. Reformatted images were reviewed. Automated exposure control, iterative reconstruction, and/or weight based adjustment of the mA/kV was utilized to reduce the radiation dose to as low as reasonably achievable. COMPARISON: Comparison with same day radiographs. CLINICAL HISTORY: Pre-operative planning. 73 year old male with chief complaints of right leg pain. Patient fell about 8 feet off of the ladder and landed on both of his feet. He said he had obvious pain and deformity to his right upper leg and was also having pain to his left heel. He denies injury  elsewhere. Denies head injury, neck pain, chest pain, abdominal pain, back pain, and upper extremity pain. FINDINGS: BONES: Comminuted fracture of the distal right femoral metadiaphysis with apex anterior/medial angulation and 1  cm of foreshortening. A fracture line in the sagittal plane extends into the tibiofemoral joint space in the intercondylar fossa. Additional nondisplaced mildly depressed fracture of the lateral tibial plateau with 1-2 mm of depression. JOINTS: No dislocation. Moderate hemarthrosis. Chondrocalcinosis of the medial and lateral compartments. SOFT TISSUES: Edema/hemorrhage within the popliteal fossa. IMPRESSION: 1. Comminuted distal right femoral metadiaphyseal fracture extending into the tibiofemoral joint in the intercondylar fossa. 2. Additional nondisplaced, mildly depressed lateral tibial plateau fracture . 3. Moderate hemarthrosis. Electronically signed by: Norman Gatlin MD 10/13/2024 09:16 PM EDT RP Workstation: [id number]   DG Tibia/Fibula Right Port Result Date: 10/13/2024 CLINICAL DATA:  Fall from ladder with right leg deformity, initial encounter EXAM: PORTABLE RIGHT TIBIA AND FIBULA - 2 VIEW COMPARISON:  None Available. FINDINGS: Known femoral fracture is incompletely evaluated on this exam. No tibial or fibular fracture is seen. No soft tissue changes are noted. IMPRESSION: No acute abnormality of the tibia and fibula. Electronically Signed   By: Oneil Devonshire M.D.   On: 10/13/2024 20:23   DG Ankle Right Port Result Date: 10/13/2024 CLINICAL DATA:  Fall from ladder with right leg deformity, initial encounter EXAM: PORTABLE RIGHT ANKLE - 2 VIEW COMPARISON:  None Available. FINDINGS: There is no evidence of fracture, dislocation, or joint effusion. There is no evidence of arthropathy or other focal bone abnormality. Soft tissues are unremarkable. IMPRESSION: No acute abnormality Electronically Signed   By: Oneil Devonshire M.D.   On: 10/13/2024 20:22   DG FEMUR PORT, 1V  RIGHT Result Date: 10/13/2024 CLINICAL DATA:  Fall from ladder with right leg deformity, initial encounter EXAM: RIGHT FEMUR PORTABLE 2 VIEW COMPARISON:  None Available. FINDINGS: Distal right femoral fracture is noted with continuation of the fracture line inferiorly towards the intercondylar notch similar to that seen on prior knee film. Mild angulation of the distal fracture fragment is seen laterally. IMPRESSION: Distal femoral fracture as described. Electronically Signed   By: Oneil Devonshire M.D.   On: 10/13/2024 20:22   DG Foot 2 Views Left Result Date: 10/13/2024 CLINICAL DATA:  Fall from ladder with left foot pain, initial encounter EXAM: LEFT FOOT - 2 VIEW COMPARISON:  None Available. FINDINGS: Comminuted fracture of the calcaneus is noted with impaction at the fracture site. Mild soft tissue swelling is noted. No other bony abnormality is seen. IMPRESSION: Comminuted calcaneal fracture. Electronically Signed   By: Oneil Devonshire M.D.   On: 10/13/2024 20:21   DG Foot Complete Right Result Date: 10/13/2024 CLINICAL DATA:  Fall from ladder with right foot pain, initial encounter EXAM: RIGHT FOOT COMPLETE - 3+ VIEW COMPARISON:  None Available. FINDINGS: There is no evidence of fracture or dislocation. There is no evidence of arthropathy or other focal bone abnormality. Soft tissues are unremarkable. Calcaneal spur is noted. IMPRESSION: No acute abnormality noted. Electronically Signed   By: Oneil Devonshire M.D.   On: 10/13/2024 20:20   DG Knee Right Port Result Date: 10/13/2024 CLINICAL DATA:  Fall from ladder with obvious right thigh deformity EXAM: PORTABLE RIGHT KNEE - 2 VIEW COMPARISON:  None Available. FINDINGS: Oblique fracture is noted through the distal femur at the diaphyseal metaphyseal junction. The fracture line extends through the metaphysis into the intercondylar notch with only mild displacement identified. Degenerative changes of the knee joint are seen. No joint effusion is noted.  IMPRESSION: Distal right femoral fracture as described. Electronically Signed   By: Oneil Devonshire M.D.   On: 10/13/2024 20:20   DG Pelvis Portable Result Date:  10/13/2024 CLINICAL DATA:  Fall from ladder with right leg pain, initial encounter EXAM: PORTABLE PELVIS 1 VIEWS COMPARISON:  None Available. FINDINGS: Pelvic ring is intact. No acute fracture or dislocation is noted. No soft tissue changes are seen. Degenerative change of the lumbar spine is noted. IMPRESSION: No acute abnormality seen. Electronically Signed   By: Oneil Devonshire M.D.   On: 10/13/2024 20:19    Labs:  Basic Metabolic Panel: Recent Labs  Lab 10/15/24 0902 10/18/24 1841 10/19/24 0604 10/20/24 0529  NA 137 135 136 134*  K 4.6 3.9 4.1 4.2  CL 99 101 101 99  CO2 24 23 25 24   GLUCOSE 241* 241* 189* 172*  BUN 15 15 16 17   CREATININE 1.11 0.93 0.92 0.98  CALCIUM  9.1 8.8* 8.8* 9.1  MG  --   --   --  1.7    CBC: Recent Labs  Lab 10/17/24 1502 10/19/24 0604 10/20/24 0529  WBC 14.4* 11.9* 14.0*  NEUTROABS  --  7.2  --   HGB 10.5* 10.4* 10.9*  HCT 31.7* 31.6* 33.0*  MCV 89.3 88.8 88.7  PLT 281 276 328    CBG: Recent Labs  Lab 10/20/24 0607 10/20/24 1207 10/20/24 1653 10/20/24 2125 10/21/24 0546  GLUCAP 172* 202* 168* 256* 275*    Brief HPI:   Antonio Patterson is a 73 y.o. right-handed male with history significant for BPH/bladder outlet obstruction, CAD, diabetes mellitus, hiatal hernia, kidney stones hyperlipidemia hypertension and medical noncompliance.  Patient presented to [facility] health emergency room 10-20 after falling off a ladder.  He denied loss of consciousness.  He immediately felt pain in his right thigh and knee and left heel.  ER workup significant for right femur, right tibial plateau and left calcaneal fractures.  He was taken to the OR by Dr. Georgina who performed right femur ORIF.  Tibial plateau fracture and calcaneus were managed in conservatively/nonoperatively and was made nonweightbearing  bilateral.  Postoperative course complicated by hyperglycemia secondary to diabetes mellitus was postoperative anemia.  Placed on Xarelto for DVT prophylaxis.  Therapy evaluations completed due to patient decreased functional mobility was admitted for a comprehensive rehab program.   Hospital Course: Antonio Patterson was admitted to rehab 10/18/2024 for inpatient therapies to consist of PT, ST and OT at least three hours five days a week. Past admission physiatrist, therapy team and rehab RN have worked together to provide customized collaborative inpatient rehab.  Pertaining to patient's right femur fracture/right tibial plateau fracture left calcaneal fracture status post fall underwent right distal femur ORIF by Dr. Georgina 10-21.  Nonweightbearing bilateral lower extremities.  Maintain on Xarelto x 6 weeks for DVT prophylaxis.  Pain management with the use of Norco as well as Robaxin.  Acute blood loss anemia no bleeding episodes and monitor with routine follow-up CBC.  Type 2 diabetes mellitus monitoring of CBGs.  Blood pressure controlled on metoprolol  Aldactone  as well as Cozaar  monitor with increased mobility.  History of BPH urinary retention maintained on Flomax  no dysuria or hematuria.  History of CAD with CABG maintained on aspirin  therapy prior to admission on hold until Xarelto completed.  Acid reflux patient takes Pepcid  at home currently on Protonix .  There was some question of patient's medical compliance prior to admission receiving education in regards to maintaining medical regimen.   Blood pressures were monitored on TID basis and remained controlled and monitored  Diabetes has been monitored with ac/hs CBG checks and SSI was use prn for tighter BS  control.    Rehab course: During patient's Patterson in rehab weekly team conferences were held to monitor patient's progress, set goals and discuss barriers to discharge. At admission, patient required minimal assist supine to sit and sit to  supine contact-guard lateral scoot transfers ambulation not attempted due to nonweightbearing precautions  He/She  has had improvement in activity tolerance, balance, postural control as well as ability to compensate for deficits. He/She has had improvement in functional use RUE/LUE  and RLE/LLE as well as improvement in awareness       Disposition:  There are no questions and answers to display.         Diet: Diabetic diet  Special Instructions: No driving smoking or alcohol  Nonweightbearing bilateral lower extremities  Continue to hold aspirin  until Xarelto completed  Medications at discharge. 1.  Tylenol  as needed 2.  Vitamin D  1000 units p.o. daily 3.  Hydrocodone 1 tablet every 4 hours as needed pain 4.  Lantus insulin  16 units daily 5.  Cozaar  75 mg daily 6.  Robaxin 500 mg 4 times daily as needed muscle spasms 7.  Toprol -XL 50 mg p.o. daily 8.  Protonix  40 mg p.o. daily 9.  MiraLAX daily 10.  Xarelto 10 mg p.o. daily 11.  Zocor  40 mg p.o. daily 12.  Aldactone  25 mg p.o. daily 13.  Flomax  0.4 mg p.o. daily  30-35 minutes were spent completing discharge summary and discharge planning     Follow-up Information     Raulkar, Sven SQUIBB, MD Follow up.   Specialty: Physical Medicine and Rehabilitation Why: No normal follow-up needed Contact information: [address] [place] [address] [phone]         Georgina Ozell LABOR, MD Follow up.   Specialty: Orthopedic Surgery Why: Call for appointment Contact information: [address] [place] [address] [phone]                 Signed: Toribio JINNY Patterson 10/21/2024, 5:53 AM

## 2024-10-21 NOTE — Progress Notes (Signed)
 Physical Therapy Session Note  Patient Details  Name: Antonio Patterson MRN: [id number] Date of Birth: [birth year]  Today's Date: 10/21/2024 PT Individual Time: 0905-1000 PT Individual Time Calculation (min): 55 min   Short Term Goals: Week 1:  PT Short Term Goal 1 (Week 1): STG+LTG 2/2 ELOS.  Skilled Therapeutic Interventions/Progress Updates:      Pt seated in WC upon arrival. Pt agreeable to therapy. Pt denies any pain.   Pt provided teach back of B LE NWBing precautions.   Self propelled WC room to day room with B UE and supervision.   Slide board transfer WC to mat table with min A for slightly up hill (2/2 shorts getting caught on slide board, and CGA/close supervision downhill. Min verabl cues provided for adherence to B LE NWB precautions. Pt positioning WC, donned/doffed B Leg rest andarm rest, and placing/removing slide board with supervision, verbal cues provided for sequencing technique.   Discussed pt car. Pt endorses his car ground to seat height is 24 inches. PT adjusted car simulator to this height and attempted slide board transfer x2 however discontinued for safety 2/2 significantly uphill.   Discussed alternative car options (such as low level SUV or sedan): pt endorses the only other option is a [employer]. PT recommended trialing slide board transfer to pt personal car to see if it is a viable option. Pt is planning to talk to his daughter this afternoon to determine her availability.   Pt navigated ramp in WC with supervision overall, pt trailed ascending backwards and forwards--no preference for either option of the other but pt appreciative of having options.   Pt seated in WC at end of session with all needs within reach and chair alarm on.     Therapy Documentation Precautions:  Precautions Precautions: Fall Recall of Precautions/Restrictions: Intact Precaution/Restrictions Comments: NWB BLES Required Braces or Orthoses: Splint/Cast Splint/Cast: Short  leg spint LLE Splint/Cast - Date Prophylactic Dressing Applied (if applicable): 10/17/24 Restrictions Weight Bearing Restrictions Per Provider Order: Yes RLE Weight Bearing Per Provider Order: Non weight bearing LLE Weight Bearing Per Provider Order: Non weight bearing  Therapy/Group: Individual Therapy  [facility] [place], , DPT  10/21/2024, 9:07 AM

## 2024-10-21 NOTE — Plan of Care (Addendum)
 Pt alert and oriented x 4. Refuses pillow support states he can turn himself. Pt educated.  Problem: RH PAIN MANAGEMENT Goal: RH STG PAIN MANAGED AT OR BELOW PT'S PAIN GOAL Description: Pain < 4 with prns Outcome: Progressing   Problem: RH BLADDER ELIMINATION Goal: RH STG MANAGE BLADDER WITH ASSISTANCE Description: STG Manage Bladder With mod I Assistance Outcome: Progressing Goal: RH STG MANAGE BLADDER WITH MEDICATION WITH ASSISTANCE Description: STG Manage Bladder With Medication With mod I Assistance. Outcome: Progressing   Problem: Education: Goal: Ability to describe self-care measures that may prevent or decrease complications (Diabetes Survival Skills Education) will improve Outcome: Progressing   Problem: Coping: Goal: Ability to adjust to condition or change in health will improve Outcome: Progressing   Problem: Fluid Volume: Goal: Ability to maintain a balanced intake and output will improve Outcome: Progressing   Problem: Health Behavior/Discharge Planning: Goal: Ability to identify and utilize available resources and services will improve Outcome: Progressing Goal: Ability to manage health-related needs will improve Outcome: Progressing   Problem: Metabolic: Goal: Ability to maintain appropriate glucose levels will improve Outcome: Progressing   Problem: Nutritional: Goal: Maintenance of adequate nutrition will improve Outcome: Progressing Goal: Progress toward achieving an optimal weight will improve Outcome: Progressing   Problem: Skin Integrity: Goal: Risk for impaired skin integrity will decrease Outcome: Progressing   Problem: Tissue Perfusion: Goal: Adequacy of tissue perfusion will improve Outcome: Progressing

## 2024-10-21 NOTE — IPOC Note (Signed)
 Overall Plan of Care [facility]) Patient Details Name: Antonio Patterson MRN: [id number] DOB: 1951-05-13  Admitting Diagnosis: Other fracture of right femur, initial encounter for closed fracture [facility])  Hospital Problems: Principal Problem:   Other fracture of right femur, initial encounter for closed fracture [facility])     Functional Problem List: Nursing Bladder, Bowel, Pain, Endurance, Medication Management, Safety  PT Balance, Safety, Endurance, Motor, Pain  OT Balance, Endurance, Motor, Safety, Pain  SLP    TR         Basic ADL's: OT Bathing, Dressing, Toileting     Advanced  ADL's: OT       Transfers: PT Bed Mobility, Bed to Chair, Car, Lobbyist, Pensions Consultant (To all surfaces)     Locomotion: PT Wheelchair Mobility, Other (comment) (ramp being built at home although per pt, will not be long enough for ADA compliance.)     Additional Impairments: OT    SLP        TR      Anticipated Outcomes Item Anticipated Outcome  Self Feeding ModI  Swallowing      Basic self-care  ModI  Toileting  ModI   Bathroom Transfers ModI  Bowel/Bladder  manage bowel and bladder w mod I assist  Transfers  supervision for transfers w/ LRAD (SB vs no device).  Locomotion  supervision for w/c mobility/management.  Communication     Cognition     Pain  Pain < 4 with prns  Safety/Judgment  manage safety w cues   Therapy Plan: PT Intensity: Minimum of 1-2 x/day ,45 to 90 minutes PT Frequency: 5 out of 7 days PT Duration Estimated Length of Stay: 7-10 days. OT Intensity: Minimum of 1-2 x/day, 45 to 90 minutes OT Frequency: 5 out of 7 days OT Duration/Estimated Length of Stay: 7-10 days     Team Interventions: Nursing Interventions Patient/Family Education, Disease Management/Prevention, Bladder Management, Pain Management, Bowel Management, Medication Management, Discharge Planning  PT interventions Community reintegration, Neuromuscular re-education, UE/LE Strength  taining/ROM, Wheelchair propulsion/positioning, Therapeutic Activities, UE/LE Coordination activities, Discharge planning, Pain management, Patient/family education, Functional mobility training, Therapeutic Exercise  OT Interventions Balance/vestibular training, Self Care/advanced ADL retraining, Therapeutic Exercise, Wheelchair propulsion/positioning, DME/adaptive equipment instruction, Pain management, UE/LE Strength taining/ROM, Therapeutic Activities, Patient/family education  SLP Interventions    TR Interventions    SW/CM Interventions Discharge Planning, Psychosocial Support, Patient/Family Education, Disease Management/Prevention   Barriers to Discharge MD  Medical stability  Nursing Decreased caregiver support, Home environment access/layout, Weight bearing restrictions 2 level 3ste, main B+B w wife who can provide min assist  PT Inaccessible home environment, Decreased caregiver support, Weight bearing restrictions NWB BLES  OT      SLP      SW       Team Discharge Planning: Destination: PT-Home ,OT- Home , SLP-  Projected Follow-up: PT-Home health PT, OT-  Other (comment) (TBD), SLP-  Projected Equipment Needs: PT-Wheelchair cushion (measurements), Wheelchair (measurements), OT- Sliding board, To be determined, 3 in 1 bedside comode, SLP-  Equipment Details: PT-pt in 18/16 w/c w/ Roho cushion, may need appropriate cushion, OT-  Patient/family involved in discharge planning: PT- Patient, Family adult nurse,  OT-Patient, Family member/caregiver, SLP-   MD ELOS: 7-10 days Medical Rehab Prognosis:  Excellent Assessment: The patient has been admitted for CIR therapies with the diagnosis of right femur fracture. The team will be addressing functional mobility, strength, stamina, balance, safety, adaptive techniques and equipment, self-care, bowel and bladder mgt, patient and caregiver education. Goals have  been set at modI. Anticipated discharge destination is home.         See Team Conference Notes for weekly updates to the plan of care

## 2024-10-21 NOTE — Progress Notes (Signed)
 Occupational Therapy Session Note  Patient Details  Name: Antonio Patterson MRN: [id number] Date of Birth: 08/01/1951  Today's Date: 10/21/2024 OT Individual Time: 1400-1430 OT Individual Time Calculation (min): 30 min    Short Term Goals: Week 1:  OT Short Term Goal 1 (Week 1): The pt will safely transfer from w/c  LOF  to all surfaces with ModI using SB OT Short Term Goal 2 (Week 1): The pt will safely complete LB bathing/  dressing with ModI incorporting AE as needed. OT Short Term Goal 3 (Week 1): The pt will tolerate >30 minutes of therapeutic activity requiring <  3 rest breaks . OT Short Term Goal 4 (Week 1): The pt will safely complete toileting hygiene with ModI incorporating AE as needed.  Skilled Therapeutic Interventions/Progress Updates:      Therapy Documentation Precautions:  Precautions Precautions: Fall Recall of Precautions/Restrictions: Intact Precaution/Restrictions Comments: NWB BLES Required Braces or Orthoses: Splint/Cast Splint/Cast: Short leg spint LLE Splint/Cast - Date Prophylactic Dressing Applied (if applicable): 10/17/24 Restrictions Weight Bearing Restrictions Per Provider Order: Yes RLE Weight Bearing Per Provider Order: Non weight bearing LLE Weight Bearing Per Provider Order: Non weight bearing General:  Pt supine in bed upon OT arrival, agreeable to OT session. OT providing therapeutic use of self in order to build rapport and discuss patient current situation and goals for therapy.  Pain: unrated pain reported in Rt knee ice, activity, intermittent rest breaks, distractions provided for pain management, pt reports tolerable to proceed.   Exercises: Pt completed the following exercise circuit in order to improve functional activity, strength and endurance to prepare for ADLs such as bathing. Pt completed the following exercises in supine position with no noted LOB/SOB and 3x10 repetitions on each exercise: -bicep curls -triceps  extensions -shoulder abduction -forward punches  Other Treatments: OT applying ice for pain and edema management to Rt knee. Pt tolerating well.     Pt supine in bed with bed alarm activated, 2 bed rails up, call light within reach and 4Ps assessed.   Therapy/Group: Individual Therapy  Camie Hoe, OTD, OTR/L 10/21/2024, 5:13 PM

## 2024-10-21 NOTE — Discharge Instructions (Signed)
 Inpatient Rehab Discharge Instructions  Antonio Patterson Discharge date and time: No discharge date for patient encounter.   Activities/Precautions/ Functional Status: Activity: Nonweightbearing bilateral lower extremities Diet: diabetic diet Wound Care: Routine skin checks Functional status:  ___ No restrictions     ___ Walk up steps independently ___ 24/7 supervision/assistance   ___ Walk up steps with assistance ___ Intermittent supervision/assistance  ___ Bathe/dress independently ___ Walk with walker     _x__ Bathe/dress with assistance ___ Walk Independently    ___ Shower independently ___ Walk with assistance    ___ Shower with assistance ___ No alcohol     ___ Return to work/school ________  Special Instructions:  No driving smoking or alcohol  My questions have been answered and I understand these instructions. I will adhere to these goals and the provided educational materials after my discharge from the hospital.  Patient/Caregiver Signature _______________________________ Date __________  Clinician Signature _______________________________________ Date __________  Please bring this form and your medication list with you to all your follow-up doctor's appointments.

## 2024-10-21 NOTE — Progress Notes (Signed)
 Patient ID: Antonio Patterson, male   DOB: 1951-04-23, 73 y.o.   MRN: [id number]  This SW covering for primary SW, Di'Asia Loreli.   SW received updates from [facility] reporting she is following this patient for [facility] needs. SW will follow-up once d/c date in place.   Graeme Jude, MSW, LCSW Office: [phone] Cell: [phone] Fax: [phone]

## 2024-10-21 NOTE — Progress Notes (Signed)
 Occupational Therapy Session Note  Patient Details  Name: Antonio Patterson MRN: [id number] Date of Birth: 01/17/1951  Today's Date: 10/21/2024 OT Individual Time: 9192-9151 OT Individual Time Calculation (min): 41 min    Short Term Goals: Week 1:  OT Short Term Goal 1 (Week 1): The pt will safely transfer from w/c  LOF  to all surfaces with ModI using SB OT Short Term Goal 2 (Week 1): The pt will safely complete LB bathing/  dressing with ModI incorporting AE as needed. OT Short Term Goal 3 (Week 1): The pt will tolerate >30 minutes of therapeutic activity requiring <  3 rest breaks . OT Short Term Goal 4 (Week 1): The pt will safely complete toileting hygiene with ModI incorporating AE as needed.  Skilled Therapeutic Interventions/Progress Updates:    Pt received supine with no c/o pain at rest. He came to EOB with (S). Slideboard placed and he completed lateral scoot to the w/c with min A ,excellent adherence to BLE NWB precautions. He declined need for ADLs. He propelled the w/c to the therapy gym. He completed another transfer to the mat- placing the board himself and requiring only CGA. Pt completed various core stabilization exercises EOM, with focus on managing intraabdominal pressure, core strengthening, and coordination to challenge dynamic sitting balance, improved stability in standing, and ultimately reduced fall risk during ADLs. Skilled cueing required for proper muscle engagement, as well as grading of resistance to provide appropriate difficulty level. He then completed 3x10 B rows with a theraband to challenge scapular strengthening for carryover to UE strength needed for ADL transfers. He returned to the w/c with CGA. He was left sitting up with all needs met.   Therapy Documentation Precautions:  Precautions Precautions: Fall Recall of Precautions/Restrictions: Intact Precaution/Restrictions Comments: NWB BLES Required Braces or Orthoses: Splint/Cast Splint/Cast: Short leg  spint LLE Splint/Cast - Date Prophylactic Dressing Applied (if applicable): 10/17/24 Restrictions Weight Bearing Restrictions Per Provider Order: Yes RLE Weight Bearing Per Provider Order: Non weight bearing LLE Weight Bearing Per Provider Order: Non weight bearing  Therapy/Group: Individual Therapy  [name] 10/21/2024, 9:07 AM

## 2024-10-21 NOTE — Progress Notes (Signed)
 Physical Therapy Session Note  Patient Details  Name: Antonio Patterson MRN: [id number] Date of Birth: 09/05/1951  Today's Date: 10/21/2024 PT Individual Time: 1032-1134 PT Individual Time Calculation (min): 62 min   Short Term Goals: Week 1:  PT Short Term Goal 1 (Week 1): STG=LTG 2/2 ELOS.  Skilled Therapeutic Interventions/Progress Updates:  Patient seated upright in w/c on entrance to room. Patient alert and agreeable to PT session.    Patient with pain complaint in BLE at start of session. Requesting pain medication. RN notified.    Wheelchair Mobility:  Session focused on mgmt of wheelchair parts as well as mobility. Demos ability to lock. Unlock brakes, swing out and remove leg rests. Requires assist for reapplying legrests. Pt propelled wheelchair 140' x2 throughout hallways in therapy department. In hallway, pt educated in pivot point of w/c and in need to maintain awareness of his occupied space from feet on elevating leg rests to posterior of chair. Educated also on maneuver of wheels in order to turn and spin in place. Pt maneuvers w/c through slalom of 16 cones spaced 5 ft apart, completes 8 cones backwards at 5 ft apart, 8 cones placed 4 ft apart and then is able to collect twelve bean bags from siderails in hallway placed 4 feet outside of cones placed 4 ft apart and is able to collect all and maneuver w/c throughout cones. Only two noted touches to cones throughout. Requires time to accustom self to occupied space and maintain all clearance of cones and walls. Ultimately performs well.    Neuromuscular Re-ed: NMR facilitated during session with focus on seated balance. Pt guided in toss of beanbags to target board. With vc, he is able to maintain balance with and without back support. Performs 3 bouts of more than 20 bags. NMR performed for improvements in motor control and coordination, balance, sequencing, judgement, and self confidence/ efficacy in performing all aspects of  mobility at highest level of independence.    Therapeutic Activity: Transfers: Pt performed slide board transfer from w/c to bed toward R side with MinA for placement and vc for removing armrest and for positioning of board. Pt iis able to initiate and complete transfer with overall CGA. Maintains balance and able to remove with MinA.  Bed Mobility: Pt performed sit>supine with MinA for bringing BLE to bed surface. VC/ tc required for effort.   Patient supine in bed at end of session with brakes locked, bed alarm set, and all needs within reach. RN entering room at end of session.   Therapy Documentation Precautions:  Precautions Precautions: Fall Recall of Precautions/Restrictions: Intact Precaution/Restrictions Comments: NWB BLES Required Braces or Orthoses: Splint/Cast Splint/Cast: Short leg spint LLE Splint/Cast - Date Prophylactic Dressing Applied (if applicable): 10/17/24 Restrictions Weight Bearing Restrictions Per Provider Order: Yes RLE Weight Bearing Per Provider Order: Non weight bearing LLE Weight Bearing Per Provider Order: Non weight bearing  Pain: Pain Assessment Pain Scale: 0-10 Pain Score: 7  Pain Location: Leg Pain Intervention(s): Medication (See eMAR)  Therapy/Group: Individual Therapy  Kayla Liles SPT Mliss DELENA Milliner PT, DPT, CSRS 10/21/2024, 12:52 PM

## 2024-10-21 NOTE — Plan of Care (Signed)
  Problem: RH BOWEL ELIMINATION Goal: RH STG MANAGE BOWEL WITH ASSISTANCE Description: STG Manage Bowel with mod I Assistance. Outcome: Progressing   Problem: RH PAIN MANAGEMENT Goal: RH STG PAIN MANAGED AT OR BELOW PT'S PAIN GOAL Description: Pain < 4 with prns Outcome: Progressing   Problem: RH BLADDER ELIMINATION Goal: RH STG MANAGE BLADDER WITH ASSISTANCE Description: STG Manage Bladder With mod I Assistance Outcome: Progressing Goal: RH STG MANAGE BLADDER WITH MEDICATION WITH ASSISTANCE Description: STG Manage Bladder With Medication With mod I Assistance. Outcome: Progressing   Problem: RH SAFETY Goal: RH STG ADHERE TO SAFETY PRECAUTIONS W/ASSISTANCE/DEVICE Description: STG Adhere to Safety Precautions With cues Assistance/Device. Outcome: Progressing   Problem: RH KNOWLEDGE DEFICIT GENERAL Goal: RH STG INCREASE KNOWLEDGE OF SELF CARE AFTER HOSPITALIZATION Description: Patient and spouse will be able to manage care at discharge using educational resources for medication, skin care and dietary modification independently Outcome: Progressing   Problem: Consults Goal: RH GENERAL PATIENT EDUCATION Description: See Patient Education module for education specifics. Outcome: Progressing Goal: Diabetes Guidelines if Diabetic/Glucose > 140 Description: If diabetic or lab glucose is > 140 mg/dl - Initiate Diabetes/Hyperglycemia Guidelines & Document Interventions  Outcome: Progressing   Problem: Education: Goal: Ability to describe self-care measures that may prevent or decrease complications (Diabetes Survival Skills Education) will improve Outcome: Progressing Goal: Individualized Educational Video(s) Outcome: Progressing   Problem: Coping: Goal: Ability to adjust to condition or change in health will improve Outcome: Progressing   Problem: Fluid Volume: Goal: Ability to maintain a balanced intake and output will improve Outcome: Progressing   Problem: Health  Behavior/Discharge Planning: Goal: Ability to identify and utilize available resources and services will improve Outcome: Progressing Goal: Ability to manage health-related needs will improve Outcome: Progressing   Problem: Metabolic: Goal: Ability to maintain appropriate glucose levels will improve Outcome: Progressing   Problem: Nutritional: Goal: Maintenance of adequate nutrition will improve Outcome: Progressing Goal: Progress toward achieving an optimal weight will improve Outcome: Progressing   Problem: Skin Integrity: Goal: Risk for impaired skin integrity will decrease Outcome: Progressing   Problem: Tissue Perfusion: Goal: Adequacy of tissue perfusion will improve Outcome: Progressing

## 2024-10-22 DIAGNOSIS — S728X1A Other fracture of right femur, initial encounter for closed fracture: Secondary | ICD-10-CM | POA: Diagnosis not present

## 2024-10-22 LAB — GLUCOSE, CAPILLARY
Glucose-Capillary: 163 mg/dL — ABNORMAL HIGH (ref 70–99)
Glucose-Capillary: 186 mg/dL — ABNORMAL HIGH (ref 70–99)
Glucose-Capillary: 189 mg/dL — ABNORMAL HIGH (ref 70–99)
Glucose-Capillary: 193 mg/dL — ABNORMAL HIGH (ref 70–99)

## 2024-10-22 MED ORDER — INSULIN GLARGINE-YFGN 100 UNIT/ML ~~LOC~~ SOLN
21.0000 [IU] | Freq: Every day | SUBCUTANEOUS | Status: DC
  Administered 2024-10-22 – 2024-10-23 (×2): 21 [IU] via SUBCUTANEOUS
  Filled 2024-10-22 (×3): qty 0.21

## 2024-10-22 MED ORDER — VITAMIN D 25 MCG (1000 UNIT) PO TABS
2000.0000 [IU] | ORAL_TABLET | Freq: Every day | ORAL | Status: DC
  Administered 2024-10-22 – 2024-10-23 (×2): 2000 [IU] via ORAL
  Filled 2024-10-22 (×2): qty 2

## 2024-10-22 MED ORDER — HYDROCODONE-ACETAMINOPHEN 5-325 MG PO TABS
1.0000 | ORAL_TABLET | Freq: Four times a day (QID) | ORAL | Status: DC | PRN
  Administered 2024-10-22 – 2024-10-24 (×5): 1 via ORAL
  Filled 2024-10-22 (×6): qty 1

## 2024-10-22 MED ORDER — MAGNESIUM GLUCONATE 500 (27 MG) MG PO TABS
500.0000 mg | ORAL_TABLET | Freq: Every day | ORAL | Status: DC
Start: 2024-10-22 — End: 2024-10-24
  Administered 2024-10-22 – 2024-10-23 (×2): 500 mg via ORAL
  Filled 2024-10-22 (×2): qty 1

## 2024-10-22 NOTE — Progress Notes (Signed)
 Physical Therapy Session Note  Patient Details  Name: Antonio Patterson MRN: [id number] Date of Birth: [birth year]  Today's Date: 10/22/2024 PT Individual Time: 1030-1055 + 1300-1400 PT Individual Time Calculation (min): 25 min + 60 min  Short Term Goals: Week 1:  PT Short Term Goal 1 (Week 1): STG+LTG 2/2 ELOS.  Skilled Therapeutic Interventions/Progress Updates:      1st session: Pt presents in bed with his spouse present. Pt has no complaints of pain, is having an ice pack on his R knee to help with swelling > pain.   Spent time discussing general DC planning, DME rec's, HH vs OP therapies for follow up, barriers to DC (car and stairs), etc. Both report feeling confident with DC as soon as possible. They report having a portable ramp for home entrance and plan to complete personal car transfer this PM with PT.   Pt demonstrated ability to complete bed mobility mod I. SB transfer from bed to w/c with setupA for w/c positioning. Reviewed setup with wife and encouraged pt to be able to direct care when needed.   Pt returned to bed in opposite direction with similar assist, compliant with WB restrictions throughout.   Ended session lying in bed with his needs met.     2nd session: Pt lying in bed to start with family present. Pt has no reports of pain.   Session focused on completing car transfer in their mini van ([employer]) since they have access to no other vehicle.   Bed mobility completed mod I. SB transfer with setupA from bed to w/c.   Transported patient off CIR floor, outside near [facility] to meet his daughter in their [place].   Due to [facility] seat height and limited door opening, unable to successfully or safely attempt a SB transfer from w/c to [place]. Only option to enter the fleeta would be through the trunk with the 3rd row seats folded down flat. Patient transferred from w/c into trunk of van with minA. Pt needing modA for lifting BLE and assisting with scooting hips backwards into the van  to get positioned. Family had plenty of pillows and blankets to help with support and comfort. Pt's back resting against back of 2nd row captain chair. Pt and daughter feel that this would be the only way to transport patient home. Pt needing modA to assist him from in the [place] out the trunk, back into the w/c. All completed via SB while compliant with his WB restrictions.   Pt returned upstairs to CIR floor. Discussed with family and CSW about concern re: back of van option. Also discussed with rehab supervisor who confirmed he would need to be safely restrained via seat belt and sitting in a car for CIR to approve safe transportation; therefore, PT recommending non-emergency transport home.      Therapy Documentation Precautions:  Precautions Precautions: Fall Recall of Precautions/Restrictions: Intact Precaution/Restrictions Comments: NWB BLES Required Braces or Orthoses: Splint/Cast Splint/Cast: Short leg spint LLE Splint/Cast - Date Prophylactic Dressing Applied (if applicable): 10/17/24 Restrictions Weight Bearing Restrictions Per Provider Order: Yes RLE Weight Bearing Per Provider Order: Non weight bearing LLE Weight Bearing Per Provider Order: Non weight bearing General:   Vital Signs:   Pain: Pain Assessment Pain Scale: 0-10 Pain Score: 4  Pain Type: Surgical pain Pain Location: Leg Pain Orientation: Right Pain Intervention(s): Medication (See eMAR) Mobility:   Locomotion :    Trunk/Postural Assessment :    Balance:   Exercises:   Other Treatments:  Therapy/Group: Individual Therapy  Antonio Patterson 10/22/2024, 12:19 PM

## 2024-10-22 NOTE — Patient Care Conference (Signed)
 Inpatient RehabilitationTeam Conference and Plan of Care Update Date: 10/22/2024   Time: 11:02 AM    Patient Name: Antonio Patterson      Medical Record Number: [id number]  Date of Birth: [birth year] Sex: Male         Room/Bed: 4M05C/4M05C-01 Payor Info: Payor: MEDICARE / Plan: MEDICARE PART A AND B / Product Type: *No Product type* /    Admit Date/Time:  10/18/2024  1:41 PM  Primary Diagnosis:  Other fracture of right femur, initial encounter for closed fracture [facility])  Hospital Problems: Principal Problem:   Other fracture of right femur, initial encounter for closed fracture [facility])    Expected Discharge Date: Expected Discharge Date: 10/24/24  Team Members Present: Physician leading conference: Dr. Sven Elks Social Worker Present: Graeme Jude, LCSW Nurse Present: Barnie Ronde, RN PT Present: Sherlean Perks, PT OT Present: Monica Peacock, OT PPS Coordinator present : Eleanor Colon, SLP     Current Status/Progress Goal Weekly Team Focus  Bowel/Bladder   Pt is continent of B&B   Pt will remain continent ob B&B   Staff will assist pt with Elimination needs    Swallow/Nutrition/ Hydration               ADL's   mod assist lower bdy dressing, CGA slide board transfers   mod I   Increase independence with BADL    Mobility   mod I bed mobility, supervision SB transfers, supervision w/c mobility   mod I  main barrier is NWB BLE restrictions. Focusing on DC planning    Communication                Safety/Cognition/ Behavioral Observations               Pain   Pt pain is being effectively managed with current analgesic regimen   Pt pain will be 3 or less on the pain scale   Pt will be assessed and medicated for pain. Pt will maintain a pain rate of 3 or less on the pain scale    Skin   Pt has surgical incisions which are free of s/sx of infection   Pt surgical incision will remain free of infections  Pt surgical incision will remain clean and  free of any s/sx of infection      Discharge Planning:  Plans to discharge home with either daughter, [name] or wife considering LOC. Prefers HH PT/OT. DME already ordered through [employer]: Wheelchair, slide board, BSC, and hoyer lift (may not need upon discharge). please contact [name] for discharge update.   Team Discussion: Patient admitted with debility post fall with right femur fracture.    Patient on target to meet rehab goals: yes, currently mod I for bed mobility and slide board transfers. On target to meet goals for discharge.  *See Care Plan and progress notes for long and short-term goals.   Revisions to Treatment Plan:  Acarbose added and insulin  adjusted Magnesium  supplement added per MD Car transfer training   Teaching Needs: Safety, medications, weight bearing restrictions, transfers, toileting,etc. Staples in place on leg will be taken out in the office when the wound has healed  Xarelto for 6 weeks post op   Current Barriers to Discharge: Decreased caregiver support, Home enviroment access/layout, and Weight bearing restrictions  Possible Resolutions to Barriers: Family education DME: Flip back arm rest wheelchair with elevating leg rests Slide board, drop arm BSC, TTB Portable ramp for entry to home     Medical  Summary Current Status: constipation, overweight, right knee pain, fluctating hypo and hypertension, type 2 diabetes  Barriers to Discharge: Medical stability  Barriers to Discharge Comments: constipation, overweight, right knee pain, fluctuating hypo and hypertension, type 2 diabetes Possible Resolutions to [employer] Focus: continue mitalax, decrease norco, provided dietary education, continue ice, continue senna-docusate, continue to monitor BP TID, continue flomax , reduced cozaar  to 75mg  daily, added acarbose   Continued Need for Acute Rehabilitation Level of Care: The patient requires daily medical management by a physician with  specialized training in physical medicine and rehabilitation for the following reasons: Direction of a multidisciplinary physical rehabilitation program to maximize functional independence : Yes Medical management of patient stability for increased activity during participation in an intensive rehabilitation regime.: Yes Analysis of laboratory values and/or radiology reports with any subsequent need for medication adjustment and/or medical intervention. : Yes   I attest that I was present, lead the team conference, and concur with the assessment and plan of the team.   Fredericka Sober B 10/22/2024, 2:40 PM

## 2024-10-22 NOTE — Progress Notes (Signed)
 PROGRESS NOTE   Subjective/Complaints: CBGs improved but still elevated, will increase insulin  Patient's chart reviewed- No issues reported overnight Vitals signs stable except for fluctating hyper and hypotension  ROS: +RLE pain-improved with ice, LBM 10/27- refusing miralax   Objective:   No results found. Recent Labs    10/20/24 0529  WBC 14.0*  HGB 10.9*  HCT 33.0*  PLT 328   Recent Labs    10/20/24 0529  NA 134*  K 4.2  CL 99  CO2 24  GLUCOSE 172*  BUN 17  CREATININE 0.98  CALCIUM  9.1    Intake/Output Summary (Last 24 hours) at 10/22/2024 0915 Last data filed at 10/21/2024 2308 Gross per 24 hour  Intake 260 ml  Output 900 ml  Net -640 ml        Physical Exam: Vital Signs Blood pressure 123/63, pulse 71, temperature 98.5 F (36.9 C), resp. rate 20, height 5' 7 (1.702 m), weight 85.9 kg, SpO2 94%. Gen: no distress, normal appearing HEENT: oral mucosa pink and moist, NCAT Cardio: Reg rate Chest: normal effort, normal rate of breathing Abd: soft, non-distended Ext: no edema Psych: pleasant, normal affect Skin: C/D/I. No apparent lesions. + Right lateral thigh surgical dressing, with minimal drainage + Intact Mepilex dressings on right anterior thigh + Healing abrasion to right hand, elbow     MSK:      No apparent deformity. + Left lower extremity postop splint     Neurologic exam:  Cognition: AAO to person, place, time and event.   Language: Fluent, No substitutions or neoglisms. No dysarthria.  Memory: No apparent deficits Insight: Good   insight into current condition.  Mood: Pleasant affect, appropriate mood.  Sensation: Intact to light touch in BL UE and Les   Reflexes: 2+ in BL UE and LEs.   CN: 2-12 grossly intact.   Coordination: No apparent tremors. No ataxia  Spasticity: MAS 0 in all extremities.     Strength:  5 out of 5 bilateral upper extremities 2 of 5 right hip,  knee extension, 4/5 dorsiflexion and plantarflexion 4/5 left hip, 3/5 knee extension, toes 4/5, improved as above 10/29   Assessment/Plan: 1. Functional deficits which require 3+ hours per day of interdisciplinary therapy in a comprehensive inpatient rehab setting. Physiatrist is providing close team supervision and 24 hour management of active medical problems listed below. Physiatrist and rehab team continue to assess barriers to discharge/monitor patient progress toward functional and medical goals  Care Tool:  Bathing    Body parts bathed by patient: Right arm, Left arm, Chest, Abdomen, Front perineal area, Right upper leg, Left upper leg, Face     Body parts n/a: Buttocks, Right lower leg, Left lower leg   Bathing assist Assist Level: Moderate Assistance - Patient 50 - 74%     Upper Body Dressing/Undressing Upper body dressing   What is the patient wearing?: Pull over shirt    Upper body assist Assist Level: Set up assist    Lower Body Dressing/Undressing Lower body dressing      What is the patient wearing?: Underwear/pull up, Pants     Lower body assist Assist for lower body dressing: Maximal Assistance -  Patient 25 - 49%     Toileting Toileting    Toileting assist Assist for toileting: Maximal Assistance - Patient 25 - 49% (MaxA to Dep)     Transfers Chair/bed transfer  Transfers assist  Chair/bed transfer activity did not occur: Safety/medical concerns  Chair/bed transfer assist level: Contact Guard/Touching assist     Locomotion Ambulation   Ambulation assist   Ambulation activity did not occur: Safety/medical concerns (Pt. indep PTA)          Walk 10 feet activity   Assist           Walk 50 feet activity   Assist           Walk 150 feet activity   Assist           Walk 10 feet on uneven surface  activity   Assist Walk 10 feet on uneven surfaces activity did not occur: Safety/medical concerns          Wheelchair     Assist Is the patient using a wheelchair?: Yes Type of Wheelchair: Manual    Wheelchair assist level: Supervision/Verbal cueing Max wheelchair distance: 150+    Wheelchair 50 feet with 2 turns activity    Assist        Assist Level: Supervision/Verbal cueing   Wheelchair 150 feet activity     Assist      Assist Level: Supervision/Verbal cueing   Blood pressure 123/63, pulse 71, temperature 98.5 F (36.9 C), resp. rate 20, height 5' 7 (1.702 m), weight 85.9 kg, SpO2 94%.  Medical Problem List and Plan: 1. Functional deficits secondary to right femur fracture, right tibial plateau fracture, and left calcaneal fracture status post fall, status post right distal femur ORIF by Dr. Georgina 10-21             -patient may not shower until surgical dressings removed             -ELOS/Goals: 7 to 10 days, modified independent PT/OT             - Stable for admission to inpatient rehab   2.  Antithrombotics: -DVT/anticoagulation:  Pharmaceutical: Xarelto for 6 weeks postop             -antiplatelet therapy: Aspirin  held while on Xarelto 3. Pain Management: Robaxin 500 mg 4 times daily, decrease norco to 1 tab q4H as needed Tylenol              - Per discharge, start opiate taper at 2 weeks postop, finish taper at 6 weeks postsurgery   4. Mood/Behavior/Sleep: None             -antipsychotic agents: None 5. Neuropsych/cognition: This patient is capable of making decisions on his own behalf. 6. Skin/Wound Care: Maintain surgical dressing for 1 week postop 7. History of severe vitamin D  deficiency: start D3 daily  8.  Anemia.  Labs in AM. 9.  Type 2 diabetes.  On sliding scale insulin . Increased long acting insulin , added acarbose  10.  Hypertension.  On metoprolol , Aldactone , Cozaar .  11.  Urinary retention: continue Flomax .  12.  Constipation.  Senokot S1 tab twice daily, asked nursing to please offer prune juice, magnesium  IV 2 grams ordered 10/27  ordered for suboptimal levels- may also help bowels, daily magnesium  supplement started HS, increase to 500mg , messaged nursing to confirm accuracy of last BM 10/27  13.  CAD s/p CABG. Aspirin  on hold.  On simvastatin . Supplement magnesium  to goal of 2,  daily supplement started HS, increase to 500mg   14.  Acid reflux.  Takes Pepcid  at home, Protonix  40 mg daily formulary, continue  15.  Left calcaneal fracture.  Managed nonoperatively, strict nonweightbearing.  Follow-up with Dr. Georgina.  16.  Right femur and tibial plateau  fractures.  Right femur ORIF, tibial plateau nonop.  Nonweightbearing.  Follow-up with Dr. Georgina.    LOS: 4 days A FACE TO FACE EVALUATION WAS PERFORMED  Dash Cardarelli P Sindhu Nguyen 10/22/2024, 9:15 AM

## 2024-10-22 NOTE — Progress Notes (Signed)
 Physical Therapy Note  Patient Details  Name: Antonio Patterson MRN: [id number] Date of Birth: [birth year] Today's Date: 10/22/2024    Physical Therapist participated in the interdisciplinary team conference, providing clinical information regarding the patient's current status, treatment goals, and weekly focus, including any barriers that need to be addressed. Please see the Inpatient Rehabilitation Team Conference and Plan of Care Update for further details.     Rankin Coolman P Trezure Cronk 10/22/2024, 11:04 AM

## 2024-10-22 NOTE — Progress Notes (Signed)
 Occupational Therapy Session Note  Patient Details  Name: Antonio Patterson MRN: [id number] Date of Birth: 10-Jan-1951  Today's Date: 10/22/2024 OT Individual Time: 9266-9151 OT Individual Time Calculation (min): 75 min    Short Term Goals: Week 1:  OT Short Term Goal 1 (Week 1): The pt will safely transfer from w/c  LOF  to all surfaces with ModI using SB OT Short Term Goal 2 (Week 1): The pt will safely complete LB bathing/  dressing with ModI incorporting AE as needed. OT Short Term Goal 3 (Week 1): The pt will tolerate >30 minutes of therapeutic activity requiring <  3 rest breaks . OT Short Term Goal 4 (Week 1): The pt will safely complete toileting hygiene with ModI incorporating AE as needed.  Skilled Therapeutic Interventions/Progress Updates:   Patient received supine in bed, fully bathed and dressed.  Patient able to transfer to wheelchair with slide board and contact guard assist.  Patient benefits from cueing for work flow for wheelchair management, alignment.  Patient wheeled himself to ADL apartment to address tub/shower transfer using tub transfer bench.  Patient able to transfer to transfer bench without slide board.  Patient maintaining weight bearing precautions.  Did discuss having a foot stool available to rest right leg on at times as leg dangling causes pain to posterior femur.  Patient with less pain when right leg supported in transfer - may benefit from leaving leg rest on initially.   Worked on managing turns, doorways, doors from wheelchair level.  Worked on Drop arm commode trasnfer with commode against the bed to allow room for lateral weight shifting.  Patient able to use safe lateral weight shift as well as boost where wife can help manage clothes. Discussed wearing elastic waist athletic type shorts for ease.   Patient returned to bed at end of session with alarm engaged and wife at bedside.  Call bell and personal items in reach.    Therapy  Documentation Precautions:  Precautions Precautions: Fall Recall of Precautions/Restrictions: Intact Precaution/Restrictions Comments: NWB BLES Required Braces or Orthoses: Splint/Cast Splint/Cast: Short leg spint LLE Splint/Cast - Date Prophylactic Dressing Applied (if applicable): 10/17/24 Restrictions Weight Bearing Restrictions Per Provider Order: Yes RLE Weight Bearing Per Provider Order: Non weight bearing LLE Weight Bearing Per Provider Order: Non weight bearing   Pain: Pain Assessment Pain Scale: 0-10 Pain Score: 4  Pain Type: Surgical pain Pain Location: Leg Pain Orientation: Right Pain Intervention(s): Medication (See eMAR)   Therapy/Group: Individual Therapy  Michelle Wnek M 10/22/2024, 12:09 PM

## 2024-10-22 NOTE — Progress Notes (Signed)
 Occupational Therapy Note  Patient Details  Name: OISIN YOAKUM MRN: [id number] Date of Birth: 07/01/1951   Occupational Therapist participated in the interdisciplinary team conference, providing clinical information regarding the patient's current status, treatment goals, and weekly focus, including any barriers that need to be addressed. Please see the Inpatient Rehabilitation Team Conference and Plan of Care Update for further details.   Aidan Caloca M 10/22/2024, 3:41 PM

## 2024-10-22 NOTE — Progress Notes (Addendum)
 Patient ID: SEABORN NAKAMA, male   DOB: 1951-05-20, 73 y.o.   MRN: [id number]  This SW covering for primary SW, Di'Asia Loreli.   1154- SW left message for pt wife to provide updates and requested return phone call.   1156-SW spoke with Mitch/[facility] to discuss DME needs and changes.   *SW met with pt and pt family to discuss d/c date 10/31 and changes with DME. Would like SW to order TTB. SW will add transfer board. Will confirm safest  mode of transportation home after updates from PT.  SW spoke with [facility] about additional DME. Will put order in parachute.   Therapy recommends non ambulance transport home as he needs to safely be retrained in a vehicle.   1436- SW spoke withpt dtr [name] to inform that therapy supervisor did not feel transortation in mini-van was safe without being restrained. She would like to know if they have appropriate restraints is this acceptable.  SW ordered DME with [facility] via parachute.   *SW called pt dtr [name] to report that therapy said he has to be in a chair and not on the floor at time of transport.   SW informed [facility] on therapies being deferred at this time due to NWB restrictions.   Graeme Jude, MSW, LCSW Office: [phone] Cell: [phone] Fax: [phone]

## 2024-10-22 NOTE — Plan of Care (Signed)
  Problem: RH BOWEL ELIMINATION Goal: RH STG MANAGE BOWEL WITH ASSISTANCE Description: STG Manage Bowel with mod I Assistance. Outcome: Progressing   Problem: RH PAIN MANAGEMENT Goal: RH STG PAIN MANAGED AT OR BELOW PT'S PAIN GOAL Description: Pain < 4 with prns Outcome: Progressing   Problem: RH BLADDER ELIMINATION Goal: RH STG MANAGE BLADDER WITH ASSISTANCE Description: STG Manage Bladder With mod I Assistance Outcome: Progressing Goal: RH STG MANAGE BLADDER WITH MEDICATION WITH ASSISTANCE Description: STG Manage Bladder With Medication With mod I Assistance. Outcome: Progressing   Problem: RH SAFETY Goal: RH STG ADHERE TO SAFETY PRECAUTIONS W/ASSISTANCE/DEVICE Description: STG Adhere to Safety Precautions With cues Assistance/Device. Outcome: Progressing   Problem: RH KNOWLEDGE DEFICIT GENERAL Goal: RH STG INCREASE KNOWLEDGE OF SELF CARE AFTER HOSPITALIZATION Description: Patient and spouse will be able to manage care at discharge using educational resources for medication, skin care and dietary modification independently Outcome: Progressing   Problem: Consults Goal: RH GENERAL PATIENT EDUCATION Description: See Patient Education module for education specifics. Outcome: Progressing Goal: Diabetes Guidelines if Diabetic/Glucose > 140 Description: If diabetic or lab glucose is > 140 mg/dl - Initiate Diabetes/Hyperglycemia Guidelines & Document Interventions  Outcome: Progressing   Problem: Education: Goal: Ability to describe self-care measures that may prevent or decrease complications (Diabetes Survival Skills Education) will improve Outcome: Progressing Goal: Individualized Educational Video(s) Outcome: Progressing   Problem: Coping: Goal: Ability to adjust to condition or change in health will improve Outcome: Progressing   Problem: Fluid Volume: Goal: Ability to maintain a balanced intake and output will improve Outcome: Progressing   Problem: Health  Behavior/Discharge Planning: Goal: Ability to identify and utilize available resources and services will improve Outcome: Progressing Goal: Ability to manage health-related needs will improve Outcome: Progressing   Problem: Metabolic: Goal: Ability to maintain appropriate glucose levels will improve Outcome: Progressing   Problem: Nutritional: Goal: Maintenance of adequate nutrition will improve Outcome: Progressing Goal: Progress toward achieving an optimal weight will improve Outcome: Progressing   Problem: Skin Integrity: Goal: Risk for impaired skin integrity will decrease Outcome: Progressing   Problem: Tissue Perfusion: Goal: Adequacy of tissue perfusion will improve Outcome: Progressing

## 2024-10-22 NOTE — Progress Notes (Signed)
 Occupational Therapy Session Note  Patient Details  Name: Antonio Patterson MRN: [id number] Date of Birth: January 09, 1951  Today's Date: 10/22/2024 OT Individual Time: 1415-1500 OT Individual Time Calculation (min): 45 min    Short Term Goals: Week 1:  OT Short Term Goal 1 (Week 1): The pt will safely transfer from w/c  LOF  to all surfaces with ModI using SB OT Short Term Goal 2 (Week 1): The pt will safely complete LB bathing/  dressing with ModI incorporting AE as needed. OT Short Term Goal 3 (Week 1): The pt will tolerate >30 minutes of therapeutic activity requiring <  3 rest breaks . OT Short Term Goal 4 (Week 1): The pt will safely complete toileting hygiene with ModI incorporating AE as needed.  Skilled Therapeutic Interventions/Progress Updates:   Patient received sitting in wheelchair family present, and troubleshooting ways to safely get patient home.  Transfer into daughter's minivan was challenging and continued problem solving occurring to determine safest option.  Family planning to bring car tomorrow to see if this is a feasible option.   Reviewed equipment and discharge plan.  Patient wheeled himself to ADL apartment to practice wide drop arm BSC transfer.  Patient able to use slide board on wider commode, however also able to transfer without slide board with emphasis on proper placement of wheelchair relative to commode.  Patient's daughter present to see transfer and she was pleased with his progress this week.  Patient left up in chair with family at bedside.    Therapy Documentation Precautions:  Precautions Precautions: Fall Recall of Precautions/Restrictions: Intact Precaution/Restrictions Comments: NWB BLES Required Braces or Orthoses: Splint/Cast Splint/Cast: Short leg spint LLE Splint/Cast - Date Prophylactic Dressing Applied (if applicable): 10/17/24 Restrictions Weight Bearing Restrictions Per Provider Order: Yes RLE Weight Bearing Per Provider Order: Non weight  bearing LLE Weight Bearing Per Provider Order: Non weight bearing   Pain:  4/10 right posterior femur - provided support during transition    Therapy/Group: Individual Therapy  Marva Hendryx M 10/22/2024, 3:20 PM

## 2024-10-23 ENCOUNTER — Other Ambulatory Visit (HOSPITAL_COMMUNITY): Payer: Self-pay

## 2024-10-23 ENCOUNTER — Encounter: Payer: Self-pay | Admitting: Cardiovascular Disease

## 2024-10-23 LAB — GLUCOSE, CAPILLARY
Glucose-Capillary: 140 mg/dL — ABNORMAL HIGH (ref 70–99)
Glucose-Capillary: 201 mg/dL — ABNORMAL HIGH (ref 70–99)
Glucose-Capillary: 220 mg/dL — ABNORMAL HIGH (ref 70–99)
Glucose-Capillary: 151 mg/dL — ABNORMAL HIGH (ref 70–99)

## 2024-10-23 MED ORDER — TAMSULOSIN HCL 0.4 MG PO CAPS
0.4000 mg | ORAL_CAPSULE | Freq: Every day | ORAL | 0 refills | Status: AC
  Filled 2024-10-23: qty 30, 30d supply, fill #0

## 2024-10-23 MED ORDER — SENNOSIDES-DOCUSATE SODIUM 8.6-50 MG PO TABS
1.0000 | ORAL_TABLET | Freq: Two times a day (BID) | ORAL | 0 refills | Status: AC
  Filled 2024-10-23: qty 28, 14d supply, fill #0

## 2024-10-23 MED ORDER — METOPROLOL SUCCINATE ER 50 MG PO TB24
50.0000 mg | ORAL_TABLET | Freq: Every day | ORAL | 0 refills | Status: AC
  Filled 2024-10-23: qty 30, 30d supply, fill #0

## 2024-10-23 MED ORDER — ACARBOSE 25 MG PO TABS
50.0000 mg | ORAL_TABLET | Freq: Three times a day (TID) | ORAL | Status: DC
  Administered 2024-10-23 – 2024-10-24 (×4): 50 mg via ORAL
  Filled 2024-10-23 (×4): qty 2

## 2024-10-23 MED ORDER — INSULIN GLARGINE 100 UNIT/ML SOLOSTAR PEN
21.0000 [IU] | PEN_INJECTOR | Freq: Every day | SUBCUTANEOUS | 0 refills | Status: AC
  Filled 2024-10-23: qty 6, 28d supply, fill #0

## 2024-10-23 MED ORDER — POLYETHYLENE GLYCOL 3350 17 GM/SCOOP PO POWD: 17.0000 g | Freq: Every day | ORAL | Status: AC | PRN

## 2024-10-23 MED ORDER — ACARBOSE 50 MG PO TABS
50.0000 mg | ORAL_TABLET | Freq: Three times a day (TID) | ORAL | 0 refills | Status: AC
  Filled 2024-10-23: qty 90, 30d supply, fill #0

## 2024-10-23 MED ORDER — MAGNESIUM GLUCONATE 500 (27 MG) MG PO TABS
500.0000 mg | ORAL_TABLET | Freq: Every day | ORAL | 0 refills | Status: AC
  Filled 2024-10-23: qty 30, 30d supply, fill #0

## 2024-10-23 MED ORDER — VITAMIN D3 25 MCG PO TABS: 3000.0000 [IU] | ORAL_TABLET | Freq: Every day | ORAL | Status: AC

## 2024-10-23 MED ORDER — INSULIN PEN NEEDLE 32G X 4 MM MISC
1.0000 | Freq: Every day | 0 refills | Status: DC
  Filled 2024-10-23: qty 100, 30d supply, fill #0

## 2024-10-23 MED ORDER — METHOCARBAMOL 500 MG PO TABS
500.0000 mg | ORAL_TABLET | Freq: Three times a day (TID) | ORAL | 0 refills | Status: DC
  Filled 2024-10-23: qty 90, 30d supply, fill #0

## 2024-10-23 MED ORDER — VITAMIN D 25 MCG (1000 UNIT) PO TABS
3000.0000 [IU] | ORAL_TABLET | Freq: Every day | ORAL | Status: DC
  Administered 2024-10-24: 3000 [IU] via ORAL
  Filled 2024-10-23: qty 3

## 2024-10-23 MED ORDER — LOSARTAN POTASSIUM 25 MG PO TABS
50.0000 mg | ORAL_TABLET | Freq: Every day | ORAL | Status: DC
  Administered 2024-10-24: 50 mg via ORAL
  Filled 2024-10-23: qty 2

## 2024-10-23 MED ORDER — LOSARTAN POTASSIUM 50 MG PO TABS
50.0000 mg | ORAL_TABLET | Freq: Every day | ORAL | 0 refills | Status: AC
  Filled 2024-10-23: qty 30, 30d supply, fill #0

## 2024-10-23 MED ORDER — RIVAROXABAN 10 MG PO TABS
10.0000 mg | ORAL_TABLET | Freq: Every day | ORAL | 0 refills | Status: DC
  Filled 2024-10-23: qty 32, 32d supply, fill #0

## 2024-10-23 NOTE — Progress Notes (Signed)
 PROGRESS NOTE   Subjective/Complaints: No new complaints this morning Patient's chart reviewed- No issues reported overnight Vitals signs stable   ROS: +RLE pain-improved with ice but still present, LBM 10/27- refusing miralax   Objective:   No results found. No results for input(s): WBC, HGB, HCT, PLT in the last 72 hours.  No results for input(s): NA, K, CL, CO2, GLUCOSE, BUN, CREATININE, CALCIUM  in the last 72 hours.   Intake/Output Summary (Last 24 hours) at 10/23/2024 1127 Last data filed at 10/23/2024 0803 Gross per 24 hour  Intake 833 ml  Output 1250 ml  Net -417 ml        Physical Exam: Vital Signs Blood pressure 127/73, pulse 65, temperature 98.4 F (36.9 C), temperature source Oral, resp. rate 18, height 5' 7 (1.702 m), weight 85.9 kg, SpO2 93%. Gen: no distress, normal appearing HEENT: oral mucosa pink and moist, NCAT Cardio: Reg rate Chest: normal effort, normal rate of breathing Abd: soft, non-distended Ext: no edema Psych: pleasant, normal affect Skin: C/D/I. No apparent lesions. + Right lateral thigh surgical dressing, with minimal drainage + Intact Mepilex dressings on right anterior thigh + Healing abrasion to right hand, elbow     MSK:      No apparent deformity. + Left lower extremity postop splint     Neurologic exam:  Cognition: AAO to person, place, time and event.   Language: Fluent, No substitutions or neoglisms. No dysarthria.  Memory: No apparent deficits Insight: Good   insight into current condition.  Mood: Pleasant affect, appropriate mood.  Sensation: Intact to light touch in BL UE and Les   Reflexes: 2+ in BL UE and LEs.   CN: 2-12 grossly intact.   Coordination: No apparent tremors. No ataxia  Spasticity: MAS 0 in all extremities.     Strength:  5 out of 5 bilateral upper extremities 2 of 5 right hip, knee extension, 4/5 dorsiflexion and  plantarflexion 4/5 left hip, 3/5 knee extension, toes 4/5, stable 10/30   Assessment/Plan: 1. Functional deficits which require 3+ hours per day of interdisciplinary therapy in a comprehensive inpatient rehab setting. Physiatrist is providing close team supervision and 24 hour management of active medical problems listed below. Physiatrist and rehab team continue to assess barriers to discharge/monitor patient progress toward functional and medical goals  Care Tool:  Bathing    Body parts bathed by patient: Right arm, Left arm, Chest, Abdomen, Front perineal area, Right upper leg, Left upper leg, Face, Buttocks, Right lower leg     Body parts n/a: Left lower leg   Bathing assist Assist Level: Independent with assistive device     Upper Body Dressing/Undressing Upper body dressing   What is the patient wearing?: Pull over shirt    Upper body assist Assist Level: Independent    Lower Body Dressing/Undressing Lower body dressing      What is the patient wearing?: Underwear/pull up, Pants     Lower body assist Assist for lower body dressing: Contact Guard/Touching assist     Toileting Toileting    Toileting assist Assist for toileting: Contact Guard/Touching assist     Transfers Chair/bed transfer  Transfers assist  Chair/bed transfer  activity did not occur: Safety/medical concerns  Chair/bed transfer assist level: Contact Guard/Touching assist     Locomotion Ambulation   Ambulation assist   Ambulation activity did not occur: Safety/medical concerns (Pt. indep PTA)          Walk 10 feet activity   Assist           Walk 50 feet activity   Assist           Walk 150 feet activity   Assist           Walk 10 feet on uneven surface  activity   Assist Walk 10 feet on uneven surfaces activity did not occur: Safety/medical concerns         Wheelchair     Assist Is the patient using a wheelchair?: Yes Type of Wheelchair:  Manual    Wheelchair assist level: Supervision/Verbal cueing Max wheelchair distance: 150+    Wheelchair 50 feet with 2 turns activity    Assist        Assist Level: Supervision/Verbal cueing   Wheelchair 150 feet activity     Assist      Assist Level: Supervision/Verbal cueing   Blood pressure 127/73, pulse 65, temperature 98.4 F (36.9 C), temperature source Oral, resp. rate 18, height 5' 7 (1.702 m), weight 85.9 kg, SpO2 93%.  Medical Problem List and Plan: 1. Functional deficits secondary to right femur fracture, right tibial plateau fracture, and left calcaneal fracture status post fall, status post right distal femur ORIF by Dr. Georgina 10-21             -patient may not shower until surgical dressings removed             -ELOS/Goals: 6 days, modified independent PT/OT             - Stable for admission to inpatient rehab  Discussed plan for d/c Friday   2.  Antithrombotics: -DVT/anticoagulation:  Pharmaceutical: Xarelto for 6 weeks postop             -antiplatelet therapy: Aspirin  held while on Xarelto 3. Pain Management: Robaxin 500 mg 4 times daily, decrease norco to 1 tab q4H as needed Tylenol              - Per discharge, start opiate taper at 2 weeks postop, finish taper at 6 weeks postsurgery   4. Mood/Behavior/Sleep: None             -antipsychotic agents: None 5. Neuropsych/cognition: This patient is capable of making decisions on his own behalf. 6. Skin/Wound Care: Maintain surgical dressing for 1 week postop  7. History of severe vitamin D  deficiency: start D3 daily, increase to 3,00U daily  8.  Anemia.  Labs in AM. 9.  Type 2 diabetes.  On sliding scale insulin . Increased long acting insulin , added acarbose-increase to 50mg  TID  10.  Hypertension.  On metoprolol , Aldactone , decrease cozaar  to 50mg  daily  11.  Urinary retention: continue Flomax .  12.  Constipation.  Senokot S1 tab twice daily, asked nursing to please offer prune juice,  magnesium  IV 2 grams ordered 10/27 ordered for suboptimal levels- may also help bowels, daily magnesium  supplement started HS, increase to 500mg , messaged nursing to confirm accuracy of last BM 10/27  13.  CAD s/p CABG. Aspirin  on hold.  On simvastatin . Supplement magnesium  to goal of 2, daily supplement started HS, increase to 500mg   14.  Acid reflux.  Takes Pepcid  at home, Protonix  40 mg daily formulary,  continue  15.  Left calcaneal fracture.  Managed nonoperatively, strict nonweightbearing.  Follow-up with Dr. Georgina.  16.  Right femur and tibial plateau  fractures.  Right femur ORIF, tibial plateau nonop.  Nonweightbearing.  Follow-up with Dr. Georgina.    LOS: 5 days A FACE TO FACE EVALUATION WAS PERFORMED  Julionna Marczak P Merrilyn Legler 10/23/2024, 11:27 AM

## 2024-10-23 NOTE — Progress Notes (Signed)
 Occupational Therapy Session Note  Patient Details  Name: Antonio Patterson MRN: [id number] Date of Birth: 1951/04/06  Today's Date: 10/23/2024 OT Individual Time:  -       Short Term Goals: Week 1:  OT Short Term Goal 1 (Week 1): The pt will safely transfer from w/c  LOF  to all surfaces with ModI using SB OT Short Term Goal 2 (Week 1): The pt will safely complete LB bathing/  dressing with ModI incorporting AE as needed. OT Short Term Goal 3 (Week 1): The pt will tolerate >30 minutes of therapeutic activity requiring <  3 rest breaks . OT Short Term Goal 4 (Week 1): The pt will safely complete toileting hygiene with ModI incorporating AE as needed.  Skilled Therapeutic Interventions/Progress Updates:   Patient received supine in bed.  Patient opting to transfer to wheelchair with slide board and bathe and dress at sink.  Patient used significant lateral lean to wash his bottom effectively while stabilizing with one hand on sink.  Patient Used reacher effectively to remove socks, used sock aide effectively to don sock on R foot.  Patient issued long handled sponge to aide with washing back and lower leg.  Patient pleased with his independence with simple modifications.   Patient transferred back to bed to effectively don pants and underwear, using rolling to fully pull up over hips.    Patient excited for discharge tomorrow, and appreciative of all the tools to be independent and the support of his family.  Left in bed with alarm engaged and call bell/ personal items in reach.   Therapy Documentation Precautions:  Precautions Precautions: Fall Recall of Precautions/Restrictions: Intact Precaution/Restrictions Comments: NWB BLES Required Braces or Orthoses: Splint/Cast Splint/Cast: Short leg spint LLE Splint/Cast - Date Prophylactic Dressing Applied (if applicable): 10/17/24 Restrictions Weight Bearing Restrictions Per Provider Order: Yes RLE Weight Bearing Per Provider Order: Non  weight bearing LLE Weight Bearing Per Provider Order: Non weight bearing   Pain:  3/10 right femur - provided reposition, Messaged MD about KPAD   Therapy/Group: Individual Therapy  Brendolyn Stockley M 10/23/2024, 12:49 PM

## 2024-10-23 NOTE — Progress Notes (Signed)
 Physical Therapy Discharge Summary  Patient Details  Name: ACXEL DINGEE MRN: [id number] Date of Birth: 09/11/1951  Date of Discharge from PT service:October 23, 2024  Patient has met 5 of 5 long term goals due to improved activity tolerance, improved balance, increased strength, and ability to compensate for deficits.  Patient to discharge at a wheelchair level setupA to mod I.   Patient's care partner is independent to provide the necessary physical assistance at discharge.  Reasons goals not met: n/a  Recommendation:  Patient will benefit from ongoing skilled PT services in outpatient setting once WB restrictions are lifted to continue to advance safe functional mobility, address ongoing impairments in functional transfers, home safety, caregiver training, and minimize fall risk.  Equipment: Manual w/c with drop arm rests and elevating leg rests  Reasons for discharge: treatment goals met and discharge from hospital  Patient/family agrees with progress made and goals achieved: Yes  PT Discharge Precautions/Restrictions Precautions Precautions: Fall Recall of Precautions/Restrictions: Intact Precaution/Restrictions Comments: NWB BLES Splint/Cast: Short leg spint LLE Splint/Cast - Date Prophylactic Dressing Applied (if applicable): 10/17/24 Restrictions Weight Bearing Restrictions Per Provider Order: Yes RLE Weight Bearing Per Provider Order: Non weight bearing LLE Weight Bearing Per Provider Order: Non weight bearing Pain Interference Pain Interference Pain Effect on Sleep: 2. Occasionally Pain Interference with Therapy Activities: 1. Rarely or not at all Pain Interference with Day-to-Day Activities: 1. Rarely or not at all Vision/Perception  Vision - History Ability to See in Adequate Light: 0 Adequate Perception Perception: Within Functional Limits Praxis Praxis: WFL  Cognition Overall Cognitive Status: Within Functional Limits for tasks  assessed Arousal/Alertness: Awake/alert Orientation Level: Oriented X4 Memory: Appears intact Awareness: Appears intact Problem Solving: Appears intact Sensation Sensation Light Touch: Appears Intact Hot/Cold: Appears Intact Motor  Motor Motor - Skilled Clinical Observations: weakness BLES R > L Motor - Discharge Observations: weakness LE's  Mobility Bed Mobility Bed Mobility: Rolling Right;Rolling Left Rolling Right: Independent Rolling Left: Independent Supine to Sit: Independent Sit to Supine: Independent Transfers Transfers: Lateral/Scoot Transfers Lateral/Scoot Transfers: Independent with assistive device Locomotion  Gait Ambulation: No Gait Gait: No Stairs / Additional Locomotion Stairs: No Corporate Treasurer: Yes Wheelchair Assistance: Independent with Scientist, Research (life Sciences): Both upper extremities Wheelchair Parts Management: Needs assistance Distance: 150+  Trunk/Postural Assessment  Cervical Assessment Cervical Assessment: Within Functional Limits Thoracic Assessment Thoracic Assessment: Within Functional Limits Lumbar Assessment Lumbar Assessment: Within Functional Limits Postural Control Postural Control: Within Functional Limits  Balance Balance Balance Assessed: Yes Static Sitting Balance Static Sitting - Balance Support: No upper extremity supported;Feet supported Static Sitting - Level of Assistance: 7: Independent Dynamic Sitting Balance Dynamic Sitting - Balance Support: No upper extremity supported;Feet supported;During functional activity Dynamic Sitting - Level of Assistance: 7: Independent Extremity Assessment  RUE Assessment RUE Assessment: Within Functional Limits Active Range of Motion (AROM) Comments: WFL LUE Assessment LUE Assessment: (P) Within Functional Limits Active Range of Motion (AROM) Comments: (P) WFL       Jonika Critz P Sundae Maners 10/23/2024, 10:58 AM

## 2024-10-23 NOTE — Progress Notes (Signed)
 Physical Therapy Session Note  Patient Details  Name: Antonio Patterson MRN: [id number] Date of Birth: 04-06-1951  Today's Date: 10/23/2024 PT Individual Time: 1515-1600 PT Individual Time Calculation (min): 45 min   Short Term Goals: Week 1:  PT Short Term Goal 1 (Week 1): STG+LTG 2/2 ELOS.  Skilled Therapeutic Interventions/Progress Updates:      Pt presents in bed to start with his wife at bedside. Pt reports unrated R knee pain, reports he already had received pain medication. Mobility and exercises provided for pain support. Wife has no questions re: DC and feels confident with plan going home tomorrow.  Bed mobility completed mod I, SB transfer with setupA for w/c positioning. Pt propelled himself mod I at wheelchair level to main gym and assisted to mat table.   Reviewed HEP that was provided. Completed 2x10 of exercise with PT cueing on sequencing, activation, and functional purpose of each.   Pt returned to his room and was helped to bed at end of treatment. All needs met.   HEP handout provided: Access Code: [email or web address] URL: [email or web address] Date: 10/23/2024 Prepared by: Sherlean Perks  Exercises - Supine Ankle Pumps  - 1 x daily - 7 x weekly - 3 sets - 10 reps - Supine Gluteal Sets  - 1 x daily - 7 x weekly - 3 sets - 10 reps - Supine Quadricep Sets  - 1 x daily - 7 x weekly - 3 sets - 10 reps - Supine Isometric Hip Adduction with Pillow at Knees  - 1 x daily - 7 x weekly - 3 sets - 10 reps - Supine Heel Slides  - 1 x daily - 7 x weekly - 3 sets - 10 reps - Supine Short Arc Quad  - 1 x daily - 7 x weekly - 3 sets - 10 reps - Supine Hip Abduction  - 1 x daily - 7 x weekly - 3 sets - 10 reps - Seated Long Arc Quad  - 1 x daily - 7 x weekly - 3 sets - 10 reps - Straight Leg Raise  - 1 x daily - 7 x weekly - 3 sets - 10 reps  Therapy Documentation Precautions:  Precautions Precautions: Fall Recall of Precautions/Restrictions:  Intact Precaution/Restrictions Comments: NWB BLES Required Braces or Orthoses: Splint/Cast Splint/Cast: Short leg spint LLE Splint/Cast - Date Prophylactic Dressing Applied (if applicable): 10/17/24 Restrictions Weight Bearing Restrictions Per Provider Order: Yes RLE Weight Bearing Per Provider Order: Non weight bearing LLE Weight Bearing Per Provider Order: Non weight bearing General:      Therapy/Group: Individual Therapy  Savir Blanke P Hjalmar Ballengee 10/23/2024, 3:04 PM

## 2024-10-23 NOTE — Plan of Care (Signed)
  Problem: RH BOWEL ELIMINATION Goal: RH STG MANAGE BOWEL WITH ASSISTANCE Description: STG Manage Bowel with mod I Assistance. Outcome: Progressing   Problem: RH PAIN MANAGEMENT Goal: RH STG PAIN MANAGED AT OR BELOW PT'S PAIN GOAL Description: Pain < 4 with prns Outcome: Progressing   Problem: RH BLADDER ELIMINATION Goal: RH STG MANAGE BLADDER WITH ASSISTANCE Description: STG Manage Bladder With mod I Assistance Outcome: Progressing Goal: RH STG MANAGE BLADDER WITH MEDICATION WITH ASSISTANCE Description: STG Manage Bladder With Medication With mod I Assistance. Outcome: Progressing   Problem: RH SAFETY Goal: RH STG ADHERE TO SAFETY PRECAUTIONS W/ASSISTANCE/DEVICE Description: STG Adhere to Safety Precautions With cues Assistance/Device. Outcome: Progressing   Problem: RH KNOWLEDGE DEFICIT GENERAL Goal: RH STG INCREASE KNOWLEDGE OF SELF CARE AFTER HOSPITALIZATION Description: Patient and spouse will be able to manage care at discharge using educational resources for medication, skin care and dietary modification independently Outcome: Progressing   Problem: Consults Goal: RH GENERAL PATIENT EDUCATION Description: See Patient Education module for education specifics. Outcome: Progressing Goal: Diabetes Guidelines if Diabetic/Glucose > 140 Description: If diabetic or lab glucose is > 140 mg/dl - Initiate Diabetes/Hyperglycemia Guidelines & Document Interventions  Outcome: Progressing   Problem: Education: Goal: Ability to describe self-care measures that may prevent or decrease complications (Diabetes Survival Skills Education) will improve Outcome: Progressing Goal: Individualized Educational Video(s) Outcome: Progressing   Problem: Coping: Goal: Ability to adjust to condition or change in health will improve Outcome: Progressing   Problem: Fluid Volume: Goal: Ability to maintain a balanced intake and output will improve Outcome: Progressing   Problem: Health  Behavior/Discharge Planning: Goal: Ability to identify and utilize available resources and services will improve Outcome: Progressing Goal: Ability to manage health-related needs will improve Outcome: Progressing   Problem: Metabolic: Goal: Ability to maintain appropriate glucose levels will improve Outcome: Progressing   Problem: Nutritional: Goal: Maintenance of adequate nutrition will improve Outcome: Progressing Goal: Progress toward achieving an optimal weight will improve Outcome: Progressing   Problem: Skin Integrity: Goal: Risk for impaired skin integrity will decrease Outcome: Progressing   Problem: Tissue Perfusion: Goal: Adequacy of tissue perfusion will improve Outcome: Progressing

## 2024-10-23 NOTE — Progress Notes (Signed)
 Physical Therapy Session Note  Patient Details  Name: Antonio Patterson MRN: [id number] Date of Birth: 15-Sep-1951  Today's Date: 10/23/2024 PT Individual Time: 1000-1058 PT Individual Time Calculation (min): 58 min   Short Term Goals: Week 1:  PT Short Term Goal 1 (Week 1): STG+LTG 2/2 ELOS.  Skilled Therapeutic Interventions/Progress Updates:      Pt presents sitting EOB, agreeable to therapy treatment. Pt has no reports of pain. Focused session on meeting daughter outside to address car transfer goals. Daughter bringing an Impala instead of the minivan.   Pt completed SB transfer from bed to wheelchair with setupA for w/c positioning. Pt compliant with NWB precautions on BLE.   Transported off CIR unit and met his daughter outside. Completed SB transfer from wheelchair to front passenger seat with setupA. Pt needing adjustments to be made to car seat positioning but able to safely enter and exit the vehicle while complying with WB restrictions. Instructed daughter to complete the transfer for a 2nd time for teachback method, min cues for general setup and otherwise able to complete without PT assist. Updated team via secure chat that patient has safe transportation for home.   Pt returned upstairs to CIR floor - assisted to mat table with SB transfer. Completed supine there-ex for BLE strengthening and ROM improvement: -supine heel slides 1x12 -supine SLR 1x12 -supine hip abd/add 1x12 *Rest breaks b/w sets  Returned to his room and assisted to bed to rest, setupA for SB transfer.     Therapy Documentation Precautions:  Precautions Precautions: Fall Recall of Precautions/Restrictions: Intact Precaution/Restrictions Comments: NWB BLES Required Braces or Orthoses: Splint/Cast Splint/Cast: Short leg spint LLE Splint/Cast - Date Prophylactic Dressing Applied (if applicable): 10/17/24 Restrictions Weight Bearing Restrictions Per Provider Order: Yes RLE Weight Bearing Per Provider  Order: Non weight bearing LLE Weight Bearing Per Provider Order: Non weight bearing General:     Therapy/Group: Individual Therapy  Sherlean SHAUNNA Perks 10/23/2024, 7:41 AM

## 2024-10-23 NOTE — Progress Notes (Incomplete)
 Inpatient Rehabilitation Discharge Medication Review by a Pharmacist  A complete drug regimen review was completed for this patient to identify any potential clinically significant medication issues.  High Risk Drug Classes Is patient taking? Indication by Medication  Antipsychotic No   Anticoagulant Yes Xarelto - VTE prophylaxis  Antibiotic No   Opioid Yes Norco - pain  Antiplatelet No   Hypoglycemics/insulin  Yes Insulin , Acarbose - DM2  Vasoactive Medication Yes Losartan , Toprol  XL - HTN, CHF  Chemotherapy No   Other Yes Simvastatin  - cholesterol Spironolactone  - CHF Tamsulosin  - urinary retention Vitamin D , Magnesium  - supplements Omeprazole  - reflux Flomax  - retention Inclisiran - cholesterol Robaxin - spasms Afrin - congestion Tramadol  - pain    Type of Medication Issue Identified Description of Issue Recommendation(s)  Drug Interaction(s) (clinically significant)     Duplicate Therapy     Allergy     No Medication Administration End Date     Incorrect Dose     Additional Drug Therapy Needed     Significant med changes from prior encounter (inform family/care partners about these prior to discharge). Aspirin  on hold while on Xarelto Communicate relevant medication changes to patient/family members at discharge from CIR.   Restart or discontinue PTA meds not resumed in CIR at discharge if clinically indicated.   Other       Clinically significant medication issues were identified that warrant physician communication and completion of prescribed/recommended actions by midnight of the next day:  No  Name of provider notified for urgent issues identified: N/A  Provider Method of Notification: N/A    Pharmacist comments: N/A  Time spent performing this drug regimen review (minutes):  20   Gregorio Cara, PharmD Clinical Pharmacist 10/24/2024 11:07 AM

## 2024-10-23 NOTE — Progress Notes (Addendum)
 Patient ID: Antonio Patterson, male   DOB: 02-Apr-1951, 73 y.o.   MRN: [id number]  This SW covering for primary SW, Charisse Gentry.   SW received updates from therapy team reporting he was able to get into front passenger seat car safely and restrained. No transportation services needed.   Pt is still waiting on remainder of DME only TTB delivered. SW informed [facility] on missing DME.  Per [facility], they are out of tock of  18x18 WC's and provided a 20x18. Reports they will bring new w/c in the morning when in stock. SW discussed with pt and wife, and also called pt dtr [name] to provide this update as well.   Graeme Jude, MSW, LCSW Office: [phone] Cell: [phone] Fax: [phone]

## 2024-10-23 NOTE — Progress Notes (Signed)
 Occupational Therapy Discharge Summary  Patient Details  Name: Antonio Patterson MRN: [id number] Date of Birth: 05/15/1951  Date of Discharge from OT service:October 23, 2024   Patient has met 7 of 8 long term goals due to improved activity tolerance, improved balance, and ability to compensate for deficits.  Patient to discharge at overall Modified Independent level.  Patient's care partner is independent to provide the necessary physical assistance at discharge.    Reasons goals not met: Patient need min assist to manage toileting due to limited weight bearing status  Recommendation:  No further OT recommended at this time  Equipment: Drop arm commode, tub transfer bench, wheelchair  Reasons for discharge: treatment goals met and discharge from hospital  Patient/family agrees with progress made and goals achieved: Yes  OT Discharge Precautions/Restrictions  Precautions Precautions: Fall Recall of Precautions/Restrictions: Intact Precaution/Restrictions Comments: NWB BLES Required Braces or Orthoses: Splint/Cast Splint/Cast - Date Prophylactic Dressing Applied (if applicable): 10/17/24 Restrictions Weight Bearing Restrictions Per Provider Order: Yes RLE Weight Bearing Per Provider Order: Non weight bearing LLE Weight Bearing Per Provider Order: Non weight bearingNWB BLE  Pain Pain Assessment Pain Score: 3  ADL ADL Equipment Provided: Reacher, Sock aid, Long-handled sponge Eating: Independent Where Assessed-Eating: Wheelchair Grooming: Independent Where Assessed-Grooming: Sitting at sink Upper Body Bathing: Modified independent Where Assessed-Upper Body Bathing: Sitting at sink Lower Body Bathing: Modified independent Where Assessed-Lower Body Bathing: Sitting at sink Upper Body Dressing: Independent Where Assessed-Upper Body Dressing: Sitting at sink Lower Body Dressing: Modified independent Where Assessed-Lower Body Dressing: Bed level, Edge of bed Toileting:  Minimal assistance Where Assessed-Toileting: Neurosurgeon Method: Scientist, Research (life Sciences): Drop arm bedside commode Tub/Shower Transfer: Modified independent Web Designer Method: Psychologist, [employer]: Insurance Underwriter: Unable to Engineer, Technical Sales Method: Unable to assess [employer]: Emergency planning/management officer, Shower seat with back Vision Baseline Vision/History: 0 No visual deficits Patient Visual Report: No change from baseline Perception  Perception: Within Functional Limits Praxis Praxis: WFL Cognition Cognition Overall Cognitive Status: Within Functional Limits for tasks assessed Arousal/Alertness: Awake/alert Orientation Level: Person;Place;Situation Person: Oriented Place: Oriented Situation: Oriented Memory: Appears intact Awareness: Appears intact Problem Solving: Appears intact Safety/Judgment: Appears intact Brief Interview for Mental Status (BIMS) Repetition of Three Words (First Attempt): 3 Temporal Orientation: Year: Correct Temporal Orientation: Month: Accurate within 5 days Temporal Orientation: Day: Correct Recall: Sock: Yes, no cue required Recall: Blue: Yes, no cue required Recall: Bed: Yes, no cue required BIMS Summary Score: 15 Sensation Sensation Light Touch: Appears Intact Hot/Cold: Appears Intact Proprioception: Appears Intact Stereognosis: Appears Intact Coordination Gross Motor Movements are Fluid and Coordinated: No Fine Motor Movements are Fluid and Coordinated: Yes Motor  Motor Motor: Within Functional Limits Motor - Discharge Observations: weakness LE's Mobility  Bed Mobility Bed Mobility: Rolling Right;Rolling Left Rolling Right: Independent Rolling Left: Independent Supine to Sit: Independent Sit to Supine: Independent  Trunk/Postural Assessment  Cervical Assessment Cervical Assessment:  Within Functional Limits Thoracic Assessment Thoracic Assessment: Within Functional Limits Lumbar Assessment Lumbar Assessment: Within Functional Limits Postural Control Postural Control: Within Functional Limits  Balance Balance Balance Assessed: Yes Static Sitting Balance Static Sitting - Balance Support: No upper extremity supported;Feet supported Static Sitting - Level of Assistance: 7: Independent Dynamic Sitting Balance Dynamic Sitting - Balance Support: No upper extremity supported;Feet supported;During functional activity Dynamic Sitting - Level of Assistance: 7: Independent Extremity/Trunk Assessment RUE Assessment RUE Assessment: Within Functional Limits Active Range of  Motion (AROM) Comments: WFL     Bernardino Dowell M 10/23/2024, 4:00 PM

## 2024-10-23 NOTE — Plan of Care (Signed)
  Problem: RH Balance Goal: LTG Patient will maintain dynamic sitting balance (PT) Description: LTG:  Patient will maintain dynamic sitting balance with assistance during mobility activities (PT) Outcome: Completed/Met Goal: LTG Patient will maintain dynamic standing balance (PT) Description: LTG:  Patient will maintain dynamic standing balance with assistance during mobility activities (PT) Outcome: Completed/Met   Problem: RH Bed Mobility Goal: LTG Patient will perform bed mobility with assist (PT) Description: LTG: Patient will perform bed mobility with assistance, with/without cues (PT). Outcome: Completed/Met   Problem: RH Bed to Chair Transfers Goal: LTG Patient will perform bed/chair transfers w/assist (PT) Description: LTG: Patient will perform bed to chair transfers with assistance (PT). Outcome: Completed/Met   Problem: RH Car Transfers Goal: LTG Patient will perform car transfers with assist (PT) Description: LTG: Patient will perform car transfers with assistance (PT). Outcome: Completed/Met   Problem: RH Wheelchair Mobility Goal: LTG Patient will propel w/c in controlled environment (PT) Description: LTG: Patient will propel wheelchair in controlled environment, # of feet with assist (PT) Outcome: Completed/Met Goal: LTG Patient will propel w/c in home environment (PT) Description: LTG: Patient will propel wheelchair in home environment, # of feet with assistance (PT). Outcome: Completed/Met

## 2024-10-24 ENCOUNTER — Other Ambulatory Visit (HOSPITAL_COMMUNITY): Payer: Self-pay

## 2024-10-24 LAB — GLUCOSE, CAPILLARY
Glucose-Capillary: 135 mg/dL — ABNORMAL HIGH (ref 70–99)
Glucose-Capillary: 178 mg/dL — ABNORMAL HIGH (ref 70–99)

## 2024-10-24 MED ORDER — HYDROCODONE-ACETAMINOPHEN 5-325 MG PO TABS: 1.0000 | ORAL_TABLET | ORAL | Status: AC | PRN

## 2024-10-24 MED ORDER — RIVAROXABAN 10 MG PO TABS: 10.0000 mg | ORAL_TABLET | Freq: Every day | ORAL | 0 refills | Status: AC

## 2024-10-24 MED ORDER — CAMPHOR-MENTHOL 0.5-0.5 % EX LOTN: TOPICAL_LOTION | CUTANEOUS | Status: AC | PRN

## 2024-10-24 MED ORDER — CAMPHOR-MENTHOL 0.5-0.5 % EX LOTN
TOPICAL_LOTION | CUTANEOUS | Status: DC | PRN
  Filled 2024-10-24: qty 222

## 2024-10-24 MED ORDER — INSULIN GLARGINE-YFGN 100 UNIT/ML ~~LOC~~ SOLN
22.0000 [IU] | Freq: Every day | SUBCUTANEOUS | Status: DC
  Filled 2024-10-24: qty 0.22

## 2024-10-24 MED ORDER — INSULIN PEN NEEDLE 32G X 4 MM MISC
1.0000 | Freq: Every day | 0 refills | Status: AC
  Filled 2024-10-24: qty 100, 30d supply, fill #0

## 2024-10-24 NOTE — Progress Notes (Signed)
 Inpatient Rehabilitation Care Coordinator Discharge Note   Patient Details  Name: Antonio Patterson MRN: [id number] Date of Birth: [birth year]   Discharge location: D/c to home  Length of Stay: 5 days  Discharge activity level: wheelchair level setupA to mod I  Home/community participation: Limited  Patient response un:Yzjouy Literacy - How often do you need to have someone help you when you read instructions, pamphlets, or other written material from your doctor or pharmacy?: Rarely  Patient response un:Dnrpjo Isolation - How often do you feel lonely or isolated from those around you?: Rarely  Services provided included: MD, RD, PT, OT, RN, CM, TR, Pharmacy, Neuropsych, SW  Financial Services:  Financial Services Utilized: Medicare    Choices offered to/list presented to: patient  Follow-up services arranged:  DME, Outpatient    Outpatient Servicies: Therapy deferred until WB status changes DME : [facility] for w/c with ELRs, TTB, DABSC, and transfer board.    Patient response to transportation need: Is the patient able to respond to transportation needs?: Yes In the past 12 months, has lack of transportation kept you from medical appointments or from getting medications?: No In the past 12 months, has lack of transportation kept you from meetings, work, or from getting things needed for daily living?: No   Patient/Family verbalized understanding of follow-up arrangements:  Yes  Individual responsible for coordination of the follow-up plan: contact pt or pt dtr [name]  Confirmed correct DME delivered: Graeme DELENA Jude 10/24/2024    Comments (or additional information):  Summary of Stay    Date/Time Discharge Planning CSW  10/21/24 1052 Plans to discharge home with either daughter, [name] or wife considering LOC. Prefers HH PT/OT. DME already ordered through [facility]: Wheelchair, slide board, BSC, and hoyer lift (may not need upon discharge). please  contact [name] for discharge update. AAC  10/20/24 1258 Plans to discharge home with either daughter, [name] or wife considering LOC. Prefers HH PT/OT. DME already ordered through [facility]: Wheelchair, slide board, BSC, and hoyer lift (may not need upon discharge). please contact [name] for discharge update. DS       Graeme DELENA Jude

## 2024-10-24 NOTE — Progress Notes (Addendum)
 Patient ID: Antonio Patterson, male   DOB: September 01, 1951, 73 y.o.   MRN: [id number]  This SW covering for primary SW, Di'Asia Loreli.  SW spoke with Antonio Patterson/[facility] liaison to discuss w/c delivery. Reports that 20x18 w/c will be swapped for an 18x18 and brought to his home.   0909-SW called pt dtr Antonio Patterson to inform on w/c swap will occur at his home and not in the hospital.   SW met with pt in room to inform on above. SW informed w/c  was swapped out. Has correct size now.   Graeme Jude, MSW, LCSW Office: [phone] Cell: [phone] Fax: [phone]

## 2024-10-24 NOTE — Progress Notes (Signed)
 PROGRESS NOTE   Subjective/Complaints: Stable for d/c today Like kpad Has rash on back, sarna lotion ordered, itching improved  ROS: +RLE pain-improved with ice but still present, LBM 10/29   Objective:   No results found. No results for input(s): WBC, HGB, HCT, PLT in the last 72 hours.  No results for input(s): NA, K, CL, CO2, GLUCOSE, BUN, CREATININE, CALCIUM  in the last 72 hours.   Intake/Output Summary (Last 24 hours) at 10/24/2024 1019 Last data filed at 10/24/2024 0916 Gross per 24 hour  Intake 720 ml  Output 1200 ml  Net -480 ml        Physical Exam: Vital Signs Blood pressure (!) 112/58, pulse 64, temperature 98.7 F (37.1 C), resp. rate 18, height 5' 7 (1.702 m), weight 85.9 kg, SpO2 96%. Gen: no distress, normal appearing HEENT: oral mucosa pink and moist, NCAT Cardio: Reg rate Chest: normal effort, normal rate of breathing Abd: soft, non-distended Ext: no edema Psych: pleasant, normal affect Skin: C/D/I. No apparent lesions. + Right lateral thigh surgical dressing, with minimal drainage + Intact Mepilex dressings on right anterior thigh + Healing abrasion to right hand, elbow     MSK:      No apparent deformity. + Left lower extremity postop splint     Neurologic exam:  Cognition: AAO to person, place, time and event.   Language: Fluent, No substitutions or neoglisms. No dysarthria.  Memory: No apparent deficits Insight: Good   insight into current condition.  Mood: Pleasant affect, appropriate mood.  Sensation: Intact to light touch in BL UE and Les   Reflexes: 2+ in BL UE and LEs.   CN: 2-12 grossly intact.   Coordination: No apparent tremors. No ataxia  Spasticity: MAS 0 in all extremities.     Strength:  5 out of 5 bilateral upper extremities 2 of 5 right hip, knee extension, 4/5 dorsiflexion and plantarflexion 4/5 left hip, 3/5 knee extension, toes 4/5,  stable 10/31   Assessment/Plan: 1. Functional deficits which require 3+ hours per day of interdisciplinary therapy in a comprehensive inpatient rehab setting. Physiatrist is providing close team supervision and 24 hour management of active medical problems listed below. Physiatrist and rehab team continue to assess barriers to discharge/monitor patient progress toward functional and medical goals  Care Tool:  Bathing    Body parts bathed by patient: Right arm, Left arm, Chest, Abdomen, Front perineal area, Right upper leg, Left upper leg, Face, Buttocks, Right lower leg     Body parts n/a: Left lower leg   Bathing assist Assist Level: Independent with assistive device     Upper Body Dressing/Undressing Upper body dressing   What is the patient wearing?: Pull over shirt    Upper body assist Assist Level: Independent    Lower Body Dressing/Undressing Lower body dressing      What is the patient wearing?: Underwear/pull up, Pants     Lower body assist Assist for lower body dressing: Independent with assitive device     Toileting Toileting    Toileting assist Assist for toileting: Contact Guard/Touching assist     Transfers Chair/bed transfer  Transfers assist  Chair/bed transfer activity did not occur:  Safety/medical concerns  Chair/bed transfer assist level: Set up assist     Locomotion Ambulation   Ambulation assist   Ambulation activity did not occur: Safety/medical concerns (NWB BLE)          Walk 10 feet activity   Assist  Walk 10 feet activity did not occur: Safety/medical concerns        Walk 50 feet activity   Assist Walk 50 feet with 2 turns activity did not occur: Safety/medical concerns         Walk 150 feet activity   Assist Walk 150 feet activity did not occur: Safety/medical concerns         Walk 10 feet on uneven surface  activity   Assist Walk 10 feet on uneven surfaces activity did not occur: Safety/medical  concerns         Wheelchair     Assist Is the patient using a wheelchair?: Yes Type of Wheelchair: Manual    Wheelchair assist level: Independent Max wheelchair distance: 150+    Wheelchair 50 feet with 2 turns activity    Assist        Assist Level: Independent   Wheelchair 150 feet activity     Assist      Assist Level: Independent   Blood pressure (!) 112/58, pulse 64, temperature 98.7 F (37.1 C), resp. rate 18, height 5' 7 (1.702 m), weight 85.9 kg, SpO2 96%.  Medical Problem List and Plan: 1. Functional deficits secondary to right femur fracture, right tibial plateau fracture, and left calcaneal fracture status post fall, status post right distal femur ORIF by Dr. Georgina 10-21             -patient may not shower until surgical dressings removed             -ELOS/Goals: 6 days, modified independent PT/OT             - Stable for admission to inpatient rehab  D/c home   2.  Antithrombotics: -DVT/anticoagulation:  Pharmaceutical: Xarelto for 6 weeks postop             -antiplatelet therapy: Aspirin  held while on Xarelto 3. Pain Management: Robaxin 500 mg 4 times daily, decrease norco to 1 tab q4H as needed Tylenol              - Per discharge, start opiate taper at 2 weeks postop, finish taper at 6 weeks postsurgery   4. Mood/Behavior/Sleep: None             -antipsychotic agents: None 5. Neuropsych/cognition: This patient is capable of making decisions on his own behalf. 6. Skin/Wound Care: Maintain surgical dressing for 1 week postop  7. History of severe vitamin D  deficiency: start D3 daily, increase to 3,00U daily  8.  Anemia.  Labs in AM. 9.  Type 2 diabetes.  On sliding scale insulin . Increased long acting insulin , added acarbose-increase to 50mg  TID  10.  Hypertension.  On metoprolol , Aldactone , decrease cozaar  to 50mg  daily  11.  Urinary retention: continue Flomax .  12.  Constipation.  Senokot S1 tab twice daily, asked nursing to please  offer prune juice, magnesium  IV 2 grams ordered 10/27 ordered for suboptimal levels- may also help bowels, daily magnesium  supplement started HS, continue 500mg , LBM 10/29  13.  CAD s/p CABG. Aspirin  on hold.  On simvastatin . Supplement magnesium  to goal of 2, daily supplement started HS, continue 500mg   14.  Acid reflux.  Takes Pepcid  at home, Protonix  40 mg  daily formulary, continue  15.  Left calcaneal fracture.  Managed nonoperatively, strict nonweightbearing.  Follow-up with Dr. Georgina.  16.  Right femur and tibial plateau  fractures.  Right femur ORIF, tibial plateau nonop.  Nonweightbearing.  Follow-up with Dr. Georgina.    >30 minutes spent in discharge of patient including review of medications and follow-up appointments, physical examination, and in answering all patient's questions   LOS: 6 days A FACE TO FACE EVALUATION WAS PERFORMED  Sven P Goldie Tregoning 10/24/2024, 10:19 AM

## 2024-10-26 NOTE — Discharge Summary (Signed)
 Physician Discharge Summary  Patient ID: Antonio Patterson MRN: [id number] DOB/AGE: Jun 14, 1951 73 y.o.  Admit date: 10/18/2024 Discharge date: 10/24/24  Discharge Diagnoses:  Principal Problem:   Other fracture of right femur, initial encounter for closed fracture [facility]) Active Problems:   Diabetes (HCC)   Essential hypertension   Mixed diabetic hyperlipidemia associated with type 2 diabetes mellitus (HCC)   Vitamin D  deficiency   Closed displaced fracture of body of left calcaneus  Acute blood loss anemia  Persistent leucocytosis  Mild hyponatremia.   Discharged Condition: stable  Significant Diagnostic Studies:   Labs:  Basic Metabolic Panel:    Latest Ref Rng & Units 10/20/2024    5:29 AM 10/19/2024    6:04 AM 10/18/2024    6:41 PM  BMP  Glucose 70 - 99 mg/dL 827  810  758   BUN 8 - 23 mg/dL 17  16  15    Creatinine 0.61 - 1.24 mg/dL 9.01  9.07  9.06   Sodium 135 - 145 mmol/L 134  136  135   Potassium 3.5 - 5.1 mmol/L 4.2  4.1  3.9   Chloride 98 - 111 mmol/L 99  101  101   CO2 22 - 32 mmol/L 24  25  23    Calcium  8.9 - 10.3 mg/dL 9.1  8.8  8.8      CBC:    Latest Ref Rng & Units 10/20/2024    5:29 AM 10/19/2024    6:04 AM 10/17/2024    3:02 PM  CBC  WBC 4.0 - 10.5 K/uL 14.0  11.9  14.4   Hemoglobin 13.0 - 17.0 g/dL 89.0  89.5  89.4   Hematocrit 39.0 - 52.0 % 33.0  31.6  31.7   Platelets 150 - 400 K/uL 328  276  281      CBG: Recent Labs  Lab 10/23/24 1115 10/23/24 1631 10/23/24 2110 10/24/24 0624 10/24/24 1127  GLUCAP 201* 220* 151* 135* 178*    Brief HPI:   Antonio Patterson is a 73 y.o. male with history of T2DM, CAD, BOO, renal calculi, bilateral tinnitus, who was admitted on 10/14/2023 after falling off a ladder with onset of right hip, knee and left leg pain.  X-rays done revealed right femur, right tibial plateau and left calcaneus fractures.  He was taken to the OR same day for ORIF of right femur and tibial plateau fracture and left calcaneus  fractures were managed nonoperatively.  He was made nonweightbearing on BLE and placed on Xarelto for DVT prophylaxis.  Hospital course complicated for issues with hypoglycemia, acute blood loss anemia as well as poor pain control.  He was evaluated by PT/OT and was noted to be requiring assistance with ADLs and mobility.  He was independent and active prior to admission.  CIR was recommended due to functional decline.   Hospital Course: Antonio Patterson was admitted to rehab 10/18/2024 for inpatient therapies to consist of PT and OT at least three hours five days a week. Past admission physiatrist, therapy team and rehab RN have worked together to provide customized collaborative inpatient rehab.  He was maintained on Xarelto for DVT prophylaxis and recommended to continue this for 5 additional weeks postdischarge.  Pain has been controlled with use of Robaxin on 4 times daily basis as well as Norco as needed.  He was maintained on nonweightbearing status BLE.  His blood pressures were monitored on TID basis and Cozaar  was decreased to 50 mg to prevent hypotension.  He  is voiding without difficulty.  Constipation has been managed with addition of bowel program.  Magnesium  was added to help with constipation as well as supplementation for goal of 2.0.  Follow up CBC showed persistent leucocytosis without s/s of infection and ABLA is stable..   Patient has history of severe vitamin D  deficiency and was started on D3 3000 units daily.  Surgical dressing was kept in place for a week and removed at discharge.  Incisions are noted to be healing well without any signs or symptoms of infection and staples are intact. His diabetes has been monitored with ac/hs CBG checks and SSI was use prn for tighter BS control.  Acarbose was added and titrated up to to 50 mg 3 times daily at discharge.  He was advised to continue monitoring his blood sugar on ACHS basis and use sliding scale insulin  in addition to Lantus.  If blood  sugars poorly controlled on best to resume home regimen and of follow-up with PCP for further input.  Mood has been stable and he has shown good progress during his rehab stay.  He is modified independent at wheelchair level.  He has been educated on HEP and therapy to resume once weightbearing restrictions liberalized.   Rehab course: During patient's stay in rehab weekly team conferences were held to monitor patient's progress, set goals and discuss barriers to discharge. At admission, patient required mod assist with mobility and mod to max assist with ADL task. He  has had improvement in activity tolerance, balance, postural control as well as ability to compensate for deficits.  He is able to complete ADL tasks at modified independent level.  He is modified independent for sliding board transfers.  Discharge disposition: 01-Home or Self Care  Diet: Heart Healthy/carb modified  Special Instructions: No weight on BLE/No walking. Keep incisions clean and dry.  3. Recommend repeat check CBC in 1-2 weeks to monitor H/H and WBC.     Allergies as of 10/24/2024       Reactions   Lipitor [atorvastatin ]    Muscle pain   Metformin  Diarrhea   Rosuvastatin    Muscle pain   Sulfa Antibiotics Rash   Sulfonamide Derivatives Rash        Medication List     STOP taking these medications    aspirin  EC 81 MG tablet   NovoLOG  FlexPen 100 UNIT/ML FlexPen Generic drug: insulin  aspart       TAKE these medications    acarbose 50 MG tablet Commonly known as: PRECOSE Take 1 tablet (50 mg total) by mouth 3 (three) times daily with meals. Notes to patient: For diabetes   acetaminophen  325 MG tablet Commonly known as: TYLENOL  Take 1-2 tablets (325-650 mg total) by mouth every 4 (four) hours as needed for mild pain (pain score 1-3).   camphor-menthol lotion Commonly known as: SARNA Apply topically as needed for itching.   Embecta Pen Needle Nano 32G X 4 MM Misc Generic drug: Insulin   Pen Needle 1 Application by Does not apply route at bedtime. Notes to patient: Use with insulin  pen   glucose blood test strip 1 each by Other route in the morning, at noon, in the evening, and at bedtime.   HYDROcodone-acetaminophen  5-325 MG tablet--Rx# Commonly known as: NORCO/VICODIN Take 1 tablet by mouth every 4 (four) hours as needed for up to 7 days for severe pain (pain score 7-10). What changed: reasons to take this   INCLISIRAN SODIUM  Penryn Inject into the skin. Notes to  patient: Next dose in Jan   Lantus SoloStar 100 UNIT/ML Solostar Pen Generic drug: insulin  glargine Inject 21 Units into the skin at bedtime. Notes to patient: Your long acting or basal insulin    losartan  50 MG tablet Commonly known as: COZAAR  Take 1 tablet (50 mg total) by mouth daily. What changed:  medication strength how much to take   Mag-G 500 (27 Mg) MG Tabs tablet Generic drug: magnesium  gluconate Take 1 tablet (500 mg total) by mouth at bedtime.   methocarbamol 500 MG tablet Commonly known as: ROBAXIN Take 1 tablet (500 mg total) by mouth 3 (three) times daily. What changed: when to take this Notes to patient: Can taper to twice a day after a few days and then later as needed--for muscle spasms   metoprolol  succinate 50 MG 24 hr tablet Commonly known as: TOPROL -XL Take 1 tablet (50 mg total) by mouth daily. Take with or immediately following a meal.   omeprazole  40 MG capsule Commonly known as: PRILOSEC Take 40 mg by mouth daily.   oxymetazoline 0.05 % nasal spray Commonly known as: AFRIN Place 1 spray into both nostrils at bedtime.   polyethylene glycol powder 17 GM/SCOOP powder Commonly known as: GLYCOLAX/MIRALAX Take 17 g by mouth daily as needed for up to 14 days. What changed:  when to take this reasons to take this   rivaroxaban 10 MG Tabs tablet Commonly known as: XARELTO Take 1 tablet (10 mg total) by mouth daily. Notes to patient: You need this for 6 weeks  total--discuss with Dr. Georgina. You have a prescription for one addition week to complete 6 weeks course of blood thinners   simvastatin  40 MG tablet Commonly known as: ZOCOR  Take 40 mg by mouth daily.   spironolactone  25 MG tablet Commonly known as: ALDACTONE  Take 1 tablet by mouth once daily   Stool Softener/Laxative 50-8.6 MG tablet Generic drug: senna-docusate Take 1 tablet by mouth 2 (two) times daily. Notes to patient: Laxative --can adjust to as needed. If you use hydrocodone you are going to be constipated   tamsulosin  0.4 MG Caps capsule Commonly known as: FLOMAX  Take 1 capsule (0.4 mg total) by mouth daily.   traMADol  50 MG tablet Commonly known as: ULTRAM  Take 1 tablet (50 mg total) by mouth every 6 (six) hours as needed for moderate pain.   vitamin D3 25 MCG tablet Commonly known as: CHOLECALCIFEROL Take 3 tablets (3,000 Units total) by mouth daily.        Follow-up Information     Raulkar, Sven SQUIBB, MD Follow up.   Specialty: Physical Medicine and Rehabilitation Why: No normal follow-up needed Contact information: [address] [place] [address] [phone]         Georgina Ozell LABOR, MD Follow up.   Specialty: Orthopedic Surgery Why: Call for appointment Contact information: [address] [place] [address] [phone]         Silvano Angeline FALCON, NP. Call.   Why: Monday for follow up appointment Contact information: [address] [place] [address] [phone]                 Signed: Sharlet GORMAN Schmitz 10/26/2024, 10:39 PM

## 2024-11-05 ENCOUNTER — Other Ambulatory Visit (INDEPENDENT_AMBULATORY_CARE_PROVIDER_SITE_OTHER)

## 2024-11-05 ENCOUNTER — Ambulatory Visit (INDEPENDENT_AMBULATORY_CARE_PROVIDER_SITE_OTHER): Admitting: Orthopedic Surgery

## 2024-11-05 DIAGNOSIS — S72402A Unspecified fracture of lower end of left femur, initial encounter for closed fracture: Secondary | ICD-10-CM | POA: Diagnosis not present

## 2024-11-05 DIAGNOSIS — S72401A Unspecified fracture of lower end of right femur, initial encounter for closed fracture: Secondary | ICD-10-CM

## 2024-11-05 DIAGNOSIS — S92012D Displaced fracture of body of left calcaneus, subsequent encounter for fracture with routine healing: Secondary | ICD-10-CM | POA: Diagnosis not present

## 2024-11-05 DIAGNOSIS — S72409A Unspecified fracture of lower end of unspecified femur, initial encounter for closed fracture: Secondary | ICD-10-CM

## 2024-11-05 MED ORDER — HYDROCODONE-ACETAMINOPHEN 5-325 MG PO TABS: 1.0000 | ORAL_TABLET | Freq: Four times a day (QID) | ORAL | 0 refills | Status: AC | PRN

## 2024-11-05 MED ORDER — METHOCARBAMOL 500 MG PO TABS: 500.0000 mg | ORAL_TABLET | Freq: Four times a day (QID) | ORAL | 0 refills | Status: DC | PRN

## 2024-11-05 NOTE — Progress Notes (Signed)
 Orthopedic Surgery Post-operative Office Visit  Procedure: right supracondylar femur fracture ORIF Date of Surgery: 10/14/2024  Also, has a right tibial plateau fracture and left calcaneus fracture which were treated non-operatively  Assessment: Patient is a 73 y.o. who is healing as expected after surgery   Plan: -Operative plans complete -Non weight bearing BLE, boot on the left lower extremity for the calcaneus fracture -Start working on right knee range of motion -Staples removed today in office. Okay to let soap/water run over incision but do not submerge -Will start PT once he is transition to weightbearing as tolerated -Pain management: Norco, robaxin -Return to office in 4 weeks, x-rays needed at next visit: right femur AP/lateral, left calcaneus lateral/Harris views  ___________________________________________________________________________   Subjective: Patient's pain in the right thigh has been improving with time.  He is mostly using Tylenol  and Robaxin to control his pain.  He periodically needs oxycodone .  He is not having any pain in the left heel unless he puts pressure on it.  He has not noticed any redness or drainage around his incisions.  He is back at home now and has made his home wheelchair accessible.  Objective:  General: no acute distress, appropriate affect Neurologic: alert, answering questions appropriately, following commands Respiratory: unlabored breathing on room air Skin: Incisions are well-approximated with no erythema, induration, active/expressible drainage  MSK  RLE: EHL/TA/GSC intact, sensation intact to light touch in sural/saphenous/deep peroneal/superficial peroneal/tibial nerve distributions, foot warm and well-perfused, swelling seen at the thigh, no swelling distally  LLE: no gross deformity, EHL/TA/GSC intact, sensation intact to light touch in sural/saphenous/deep peroneal/superficial superficial peroneal/tibial nerve distributions,  foot warm well-perfused  -Sensory exam    Sensation intact to light touch in L3-S1 nerve distributions of bilateral lower extremities  Imaging: X-rays of the right femur taken 11/05/2024 were independently reviewed and interpreted, showing minimally displaced lateral tibial plateau fracture.  Plate fixation over the distal femur is seen.  There are 3 screws proximal to a comminuted supracondylar femur fracture that extends into the intra-articular space.  There are multiple screws distal to the fracture.  Fracture alignment appears well-maintained since intraoperative films.  The femur fracture is minimally displaced.  No new fracture seen.  X-rays of the left calcaneus taken 11/05/2024 were independently reviewed and interpreted, showing a transverse fracture near the posterior aspect of the calcaneus.  There is also a longitudinal fracture through the calcaneus just posterior to the subtalar joint.  Fractures appear minimally displaced.  No dislocation seen.  No other fracture seen.   Patient name: Antonio Patterson Patient MRN: [id number] Date of visit: 11/05/24

## 2024-12-03 ENCOUNTER — Other Ambulatory Visit (INDEPENDENT_AMBULATORY_CARE_PROVIDER_SITE_OTHER)

## 2024-12-03 ENCOUNTER — Ambulatory Visit (INDEPENDENT_AMBULATORY_CARE_PROVIDER_SITE_OTHER): Admitting: Orthopedic Surgery

## 2024-12-03 DIAGNOSIS — S72401A Unspecified fracture of lower end of right femur, initial encounter for closed fracture: Secondary | ICD-10-CM

## 2024-12-03 DIAGNOSIS — S72409A Unspecified fracture of lower end of unspecified femur, initial encounter for closed fracture: Secondary | ICD-10-CM

## 2024-12-03 DIAGNOSIS — S92012D Displaced fracture of body of left calcaneus, subsequent encounter for fracture with routine healing: Secondary | ICD-10-CM

## 2024-12-03 MED ORDER — METHOCARBAMOL 500 MG PO TABS: 500.0000 mg | ORAL_TABLET | Freq: Every evening | ORAL | 0 refills | Status: AC

## 2024-12-03 MED ORDER — TRAMADOL HCL 50 MG PO TABS: 50.0000 mg | ORAL_TABLET | Freq: Four times a day (QID) | ORAL | 0 refills | Status: AC | PRN

## 2024-12-03 NOTE — Progress Notes (Addendum)
 Orthopedic Surgery Post-operative Office Visit   Procedure: right supracondylar femur fracture ORIF Date of Surgery: 10/14/2024 (~6 weeks post-op)   Also, has a right tibial plateau fracture and left calcaneus fracture which were treated non-operatively   Assessment: Patient is a 73 y.o. who is healing as expected after surgery     Plan: -Operative plans complete -Non weight bearing BLE, boot on the left lower extremity for the calcaneus fracture -Encouraged ankle range of motion on the left and knee range of motion on the right.  Explained that at 3 months from time of injury, it would be difficult to gain further range of motion so he has about 6 more weeks to regain his range of motion.  Gave him exercises to help with the range of motion -Okay to submerge wounds at this point -DVT ppx: Xarelto  -Will start PT once he is transition to weightbearing as tolerated -Pain management: tramadol , robaxin  -Return to office in 6 weeks, x-rays needed at next visit: right femur AP/lateral, left calcaneus lateral/Harris views   ___________________________________________________________________________     Subjective: Patient has some pain when he is trying to sleep at night.  Otherwise, he is not having any pain in his right lower extremity or around his left foot/ankle.  He is taking hydrocodone  at night but that is about it.  He is also using methocarbamol  at night.  He notes some muscle spasms in the right thigh at night.  He does not have them during the day.  He has been in a wheelchair not putting any weight on either lower extremity.  He is been in a boot on the left lower extremity.  He has not noticed any redness or drainage around his incisions.  He is still at home.   Objective:   General: no acute distress, appropriate affect Neurologic: alert, answering questions appropriately, following commands Respiratory: unlabored breathing on room air Skin: Incisions are well healed with no  erythema, induration, active/expressible drainage   MSK  RLE: ROM at the knee from 5-90, EHL/TA/GSC intact, sensation intact to light touch in sural/saphenous/deep peroneal/superficial peroneal/tibial nerve distributions, foot warm and well-perfused, swelling seen at the thigh, no swelling distally   LLE: ROM at the ankle from neutral to 30, no gross deformity, EHL/TA/GSC intact, sensation intact to light touch in sural/saphenous/deep peroneal/superficial superficial peroneal/tibial nerve distributions, foot warm well-perfused      Imaging: XRs of the right femur from 12/03/2024 were independently reviewed interpret, showing a comminuted and minimally displaced right supracondylar femur fracture.  There is lateral plate fixation in place.  No lucency seen around the screws.  The fracture alignment appears similar to prior films on 11/05/2024.  There is a minimally displaced lateral tibial plateau fracture.  No other fracture seen.  No dislocation seen.  XRs of the left calcaneus from 12/03/2024 were independent reviewed and interpret, showing an oblique fracture along the tuberosity of the calcaneus on the lateral view.  There is comminution seen near the anterior aspect of the calcaneus.  On the Harris view, there is a transverse fracture seen through the tuberosity.  The alignment appears similar to his prior film on 11/05/2024.  No new fracture seen.   Patient name: Antonio Patterson Patient MRN: [id number] Date of visit: 12/03/24

## 2024-12-11 ENCOUNTER — Telehealth: Payer: Self-pay | Admitting: Orthopedic Surgery

## 2024-12-11 NOTE — Telephone Encounter (Signed)
 Scheduled for 12/15/24 @ 415

## 2024-12-11 NOTE — Telephone Encounter (Signed)
 Pt called and said he is having problems with his R hill and he wants to know do you think he should have an xray or anything. RA#[phone]

## 2024-12-12 ENCOUNTER — Telehealth: Payer: Self-pay

## 2024-12-12 NOTE — Telephone Encounter (Signed)
 Patients daughter called stating that patient is having a lot of pain and swelling in his right ankle.  Advised patient does have an appt.on Monday,if not able to wait, advised to go to an urgent care or ED.

## 2024-12-15 ENCOUNTER — Ambulatory Visit: Admitting: Orthopedic Surgery

## 2024-12-15 ENCOUNTER — Other Ambulatory Visit (INDEPENDENT_AMBULATORY_CARE_PROVIDER_SITE_OTHER): Payer: Self-pay

## 2024-12-15 DIAGNOSIS — M25571 Pain in right ankle and joints of right foot: Secondary | ICD-10-CM

## 2024-12-15 NOTE — Progress Notes (Signed)
 Orthopedic Office Note  Patient comes in today for right ankle pain. It started about a week ago. He thinks it started as he was working on knee range of motion exercises.  His heel hit the ground several times.  He noticed swelling around the ankle.  He did not previously had this ankle pain.  He has been icing and elevating it over the weekend.  He said he is not having any pain anymore.  His swelling has improved as well.   On exam, he has ankle range of motion from 0 to 30 degrees, he has no pain with inversion and eversion of the hindfoot.  He has no tenderness to palpation over the foot.  He does have tenderness to palpation over the anterior lateral ankle in the area of the AITFL.  No tenderness palpation over either malleolus.  No plantar ecchymosis or other ecchymosis seen over the foot or ankle.  No pain with piano key testing of the 1st or 2nd metatarsals.  No open wounds seen.  Sensation intact to light touch in sural/saphenous/deep peroneal/superficial peroneal/tibial nerve distributions.  Foot warm and well-perfused.   Possible small ossific fragment seen in the anterior lateral ankle joint best seen on one of the AP views.  Otherwise, no fracture seen.  His pain has been getting better with elevation and ice.  He says that its gotten better as the swelling is gone down.  Accordingly, told him to continue to ice and elevate.  Provided him with a prescription for a ice machine.  I told him to let me know if and a couple weeks time he still having pain at which point I would order an MRI.  If pain gets better, then we will hold off on further advanced imaging.  He should continue to follow-up as previously arranged for his femur and calcaneus fractures.   Antonio DELENA Ada, MD Orthopedic Surgeon

## 2025-01-01 ENCOUNTER — Telehealth: Payer: Self-pay

## 2025-01-01 NOTE — Telephone Encounter (Signed)
 Auth Submission: NO AUTH NEEDED Site of care: Site of care: [facility] Payer: Medicare A/B with [employer] Medication & CPT/J Code(s) submitted: Leqvio  (Inclisiran) J1306 Diagnosis Code:  Route of submission (phone, fax, portal):  Phone # Fax # Auth type: Buy/Bill PB Units/visits requested: 284mg  x 2 doses Reference number:  Approval from: 01/01/25 to 01/24/26

## 2025-01-02 ENCOUNTER — Encounter: Payer: Self-pay | Admitting: Cardiovascular Disease

## 2025-01-02 ENCOUNTER — Other Ambulatory Visit: Payer: Self-pay

## 2025-01-13 LAB — LAB REPORT - SCANNED
A1c: 6.5
EGFR: 94.7

## 2025-01-21 ENCOUNTER — Ambulatory Visit: Admitting: Orthopedic Surgery

## 2025-01-21 ENCOUNTER — Ambulatory Visit: Payer: Self-pay | Admitting: Cardiovascular Disease

## 2025-01-23 ENCOUNTER — Ambulatory Visit

## 2025-01-23 MED ORDER — INCLISIRAN SODIUM 284 MG/1.5ML ~~LOC~~ SOSY: 284.0000 mg | PREFILLED_SYRINGE | Freq: Once | SUBCUTANEOUS | Status: AC

## 2025-02-02 ENCOUNTER — Ambulatory Visit: Admitting: Orthopedic Surgery
